# Patient Record
Sex: Female | Born: 1935 | Race: White | Hispanic: No | Smoking: Former smoker
Health system: Southern US, Community
[De-identification: ages and names within clinical notes are randomized; demographics above are authoritative.]

## PROBLEM LIST (undated history)

## (undated) DIAGNOSIS — R413 Other amnesia: Secondary | ICD-10-CM

## (undated) DIAGNOSIS — R8279 Other abnormal findings on microbiological examination of urine: Secondary | ICD-10-CM

## (undated) DIAGNOSIS — I050 Rheumatic mitral stenosis: Secondary | ICD-10-CM

## (undated) DIAGNOSIS — N390 Urinary tract infection, site not specified: Secondary | ICD-10-CM

## (undated) DIAGNOSIS — J189 Pneumonia, unspecified organism: Secondary | ICD-10-CM

## (undated) HISTORY — PX: BILATERAL SALPINGOOPHORECTOMY: SHX1223

## (undated) HISTORY — PX: ABDOMINAL HYSTERECTOMY: SHX81

## (undated) HISTORY — PX: APPENDECTOMY: SHX54

## (undated) HISTORY — DX: Other abnormal findings on microbiological examination of urine: R82.79

## (undated) HISTORY — DX: Rheumatic mitral stenosis: I05.0

## (undated) HISTORY — DX: Other amnesia: R41.3

## (undated) HISTORY — PX: TUBAL LIGATION: SHX77

## (undated) HISTORY — DX: Pneumonia, unspecified organism: J18.9

## (undated) HISTORY — PX: EYE SURGERY: SHX253

---

## 1998-05-21 ENCOUNTER — Ambulatory Visit (HOSPITAL_COMMUNITY): Admission: RE | Admit: 1998-05-21 | Discharge: 1998-05-21 | Payer: Self-pay | Admitting: Obstetrics & Gynecology

## 1999-05-20 ENCOUNTER — Ambulatory Visit (HOSPITAL_COMMUNITY): Admission: RE | Admit: 1999-05-20 | Discharge: 1999-05-20 | Payer: Self-pay | Admitting: Obstetrics & Gynecology

## 2000-04-06 ENCOUNTER — Encounter: Admission: RE | Admit: 2000-04-06 | Discharge: 2000-04-06 | Payer: Self-pay | Admitting: Family Medicine

## 2000-04-06 ENCOUNTER — Encounter: Payer: Self-pay | Admitting: Family Medicine

## 2000-05-25 ENCOUNTER — Ambulatory Visit (HOSPITAL_COMMUNITY): Admission: RE | Admit: 2000-05-25 | Discharge: 2000-05-25 | Payer: Self-pay | Admitting: Obstetrics & Gynecology

## 2001-03-18 ENCOUNTER — Encounter: Payer: Self-pay | Admitting: Family Medicine

## 2001-03-18 ENCOUNTER — Encounter: Admission: RE | Admit: 2001-03-18 | Discharge: 2001-03-18 | Payer: Self-pay | Admitting: Family Medicine

## 2001-04-10 ENCOUNTER — Encounter: Admission: RE | Admit: 2001-04-10 | Discharge: 2001-04-10 | Payer: Self-pay | Admitting: Family Medicine

## 2001-04-10 ENCOUNTER — Encounter: Payer: Self-pay | Admitting: Family Medicine

## 2001-04-15 ENCOUNTER — Encounter: Admission: RE | Admit: 2001-04-15 | Discharge: 2001-04-15 | Payer: Self-pay | Admitting: Family Medicine

## 2001-04-15 ENCOUNTER — Encounter: Payer: Self-pay | Admitting: Family Medicine

## 2001-05-03 ENCOUNTER — Ambulatory Visit (HOSPITAL_COMMUNITY): Admission: RE | Admit: 2001-05-03 | Discharge: 2001-05-03 | Payer: Self-pay | Admitting: Obstetrics & Gynecology

## 2002-04-17 ENCOUNTER — Encounter: Admission: RE | Admit: 2002-04-17 | Discharge: 2002-04-17 | Payer: Self-pay | Admitting: Family Medicine

## 2002-04-17 ENCOUNTER — Encounter: Payer: Self-pay | Admitting: Family Medicine

## 2002-04-24 ENCOUNTER — Inpatient Hospital Stay (HOSPITAL_COMMUNITY): Admission: RE | Admit: 2002-04-24 | Discharge: 2002-04-26 | Payer: Self-pay | Admitting: *Deleted

## 2002-04-24 ENCOUNTER — Encounter (INDEPENDENT_AMBULATORY_CARE_PROVIDER_SITE_OTHER): Payer: Self-pay

## 2003-04-20 ENCOUNTER — Encounter: Payer: Self-pay | Admitting: Family Medicine

## 2003-04-20 ENCOUNTER — Encounter: Admission: RE | Admit: 2003-04-20 | Discharge: 2003-04-20 | Payer: Self-pay | Admitting: Family Medicine

## 2004-04-20 ENCOUNTER — Encounter: Admission: RE | Admit: 2004-04-20 | Discharge: 2004-04-20 | Payer: Self-pay | Admitting: Family Medicine

## 2004-06-07 ENCOUNTER — Encounter: Admission: RE | Admit: 2004-06-07 | Discharge: 2004-06-07 | Payer: Self-pay | Admitting: Family Medicine

## 2004-10-04 ENCOUNTER — Ambulatory Visit: Payer: Self-pay | Admitting: Family Medicine

## 2004-11-08 ENCOUNTER — Ambulatory Visit: Payer: Self-pay | Admitting: Family Medicine

## 2004-11-24 ENCOUNTER — Ambulatory Visit: Payer: Self-pay | Admitting: Family Medicine

## 2005-04-21 ENCOUNTER — Encounter: Admission: RE | Admit: 2005-04-21 | Discharge: 2005-04-21 | Payer: Self-pay | Admitting: Family Medicine

## 2005-06-02 ENCOUNTER — Ambulatory Visit: Payer: Self-pay | Admitting: Family Medicine

## 2005-07-21 ENCOUNTER — Ambulatory Visit: Payer: Self-pay | Admitting: Family Medicine

## 2005-09-04 ENCOUNTER — Ambulatory Visit: Payer: Self-pay | Admitting: Family Medicine

## 2005-09-18 ENCOUNTER — Ambulatory Visit: Payer: Self-pay | Admitting: Family Medicine

## 2006-05-29 ENCOUNTER — Encounter: Admission: RE | Admit: 2006-05-29 | Discharge: 2006-05-29 | Payer: Self-pay | Admitting: Family Medicine

## 2007-01-29 ENCOUNTER — Ambulatory Visit: Payer: Self-pay | Admitting: Family Medicine

## 2007-02-19 ENCOUNTER — Ambulatory Visit: Payer: Self-pay | Admitting: Family Medicine

## 2007-05-22 ENCOUNTER — Ambulatory Visit: Payer: Self-pay | Admitting: Family Medicine

## 2007-05-30 ENCOUNTER — Encounter: Payer: Self-pay | Admitting: Family Medicine

## 2007-05-30 ENCOUNTER — Ambulatory Visit: Payer: Self-pay | Admitting: Family Medicine

## 2007-07-09 ENCOUNTER — Encounter: Admission: RE | Admit: 2007-07-09 | Discharge: 2007-07-09 | Payer: Self-pay | Admitting: Family Medicine

## 2007-07-23 DIAGNOSIS — M810 Age-related osteoporosis without current pathological fracture: Secondary | ICD-10-CM | POA: Insufficient documentation

## 2007-07-23 DIAGNOSIS — H918X9 Other specified hearing loss, unspecified ear: Secondary | ICD-10-CM

## 2007-07-23 DIAGNOSIS — I050 Rheumatic mitral stenosis: Secondary | ICD-10-CM | POA: Insufficient documentation

## 2007-07-23 DIAGNOSIS — G43909 Migraine, unspecified, not intractable, without status migrainosus: Secondary | ICD-10-CM | POA: Insufficient documentation

## 2007-07-23 DIAGNOSIS — R609 Edema, unspecified: Secondary | ICD-10-CM

## 2007-09-02 ENCOUNTER — Ambulatory Visit: Payer: Self-pay | Admitting: Family Medicine

## 2007-09-02 LAB — CONVERTED CEMR LAB
Albumin: 4.5 g/dL (ref 3.5–5.2)
Basophils Absolute: 0 10*3/uL (ref 0.0–0.1)
Basophils Relative: 0.6 % (ref 0.0–1.0)
Bilirubin Urine: NEGATIVE
Bilirubin, Direct: 0.2 mg/dL (ref 0.0–0.3)
Chloride: 107 meq/L (ref 96–112)
Eosinophils Absolute: 0.1 10*3/uL (ref 0.0–0.6)
Glucose, Bld: 95 mg/dL (ref 70–99)
Glucose, Urine, Semiquant: NEGATIVE
HCT: 38.8 % (ref 36.0–46.0)
MCHC: 33.9 g/dL (ref 30.0–36.0)
Neutro Abs: 5 10*3/uL (ref 1.4–7.7)
Neutrophils Relative %: 74 % (ref 43.0–77.0)
Platelets: 261 10*3/uL (ref 150–400)
RBC: 4.12 M/uL (ref 3.87–5.11)
RDW: 12.5 % (ref 11.5–14.6)
Sodium: 147 meq/L — ABNORMAL HIGH (ref 135–145)
TSH: 1.74 microintl units/mL (ref 0.35–5.50)
Urobilinogen, UA: 1
pH: 8.5

## 2008-01-08 DIAGNOSIS — R109 Unspecified abdominal pain: Secondary | ICD-10-CM

## 2008-01-10 ENCOUNTER — Ambulatory Visit: Payer: Self-pay | Admitting: Internal Medicine

## 2008-01-10 ENCOUNTER — Ambulatory Visit: Payer: Self-pay | Admitting: Family Medicine

## 2008-01-10 DIAGNOSIS — M549 Dorsalgia, unspecified: Secondary | ICD-10-CM | POA: Insufficient documentation

## 2008-01-10 LAB — CONVERTED CEMR LAB
Bilirubin Urine: NEGATIVE
Blood in Urine, dipstick: NEGATIVE
Ketones, urine, test strip: NEGATIVE
Nitrite: NEGATIVE
Protein, U semiquant: NEGATIVE
Urobilinogen, UA: NEGATIVE
pH: 6

## 2008-06-29 ENCOUNTER — Emergency Department (HOSPITAL_COMMUNITY): Admission: EM | Admit: 2008-06-29 | Discharge: 2008-06-30 | Payer: Self-pay | Admitting: Emergency Medicine

## 2008-07-07 ENCOUNTER — Telehealth: Payer: Self-pay | Admitting: Internal Medicine

## 2008-07-08 ENCOUNTER — Telehealth: Payer: Self-pay | Admitting: Internal Medicine

## 2008-07-09 ENCOUNTER — Encounter: Admission: RE | Admit: 2008-07-09 | Discharge: 2008-07-09 | Payer: Self-pay | Admitting: Family Medicine

## 2008-07-09 ENCOUNTER — Emergency Department (HOSPITAL_COMMUNITY): Admission: EM | Admit: 2008-07-09 | Discharge: 2008-07-09 | Payer: Self-pay | Admitting: Emergency Medicine

## 2008-09-02 ENCOUNTER — Ambulatory Visit: Payer: Self-pay | Admitting: Family Medicine

## 2008-09-02 LAB — CONVERTED CEMR LAB
Bilirubin Urine: NEGATIVE
Blood in Urine, dipstick: NEGATIVE
Nitrite: POSITIVE
Specific Gravity, Urine: 1.01
pH: 6.5

## 2008-09-03 LAB — CONVERTED CEMR LAB
ALT: 15 units/L (ref 0–35)
AST: 20 units/L (ref 0–37)
BUN: 12 mg/dL (ref 6–23)
Basophils Relative: 0.6 % (ref 0.0–3.0)
Bilirubin, Direct: 0.1 mg/dL (ref 0.0–0.3)
Chloride: 104 meq/L (ref 96–112)
GFR calc Af Amer: 122 mL/min
HCT: 37.6 % (ref 36.0–46.0)
Hemoglobin: 13.1 g/dL (ref 12.0–15.0)
Lymphocytes Relative: 22.7 % (ref 12.0–46.0)
MCV: 94.1 fL (ref 78.0–100.0)
Monocytes Relative: 6.3 % (ref 3.0–12.0)
Potassium: 3.7 meq/L (ref 3.5–5.1)
RBC: 4 M/uL (ref 3.87–5.11)
Sodium: 143 meq/L (ref 135–145)
Total Protein: 7 g/dL (ref 6.0–8.3)

## 2008-10-05 ENCOUNTER — Ambulatory Visit: Payer: Self-pay | Admitting: Family Medicine

## 2008-10-05 DIAGNOSIS — J029 Acute pharyngitis, unspecified: Secondary | ICD-10-CM | POA: Insufficient documentation

## 2008-10-05 DIAGNOSIS — H6120 Impacted cerumen, unspecified ear: Secondary | ICD-10-CM | POA: Insufficient documentation

## 2008-10-05 LAB — CONVERTED CEMR LAB: Rapid Strep: NEGATIVE

## 2008-10-31 ENCOUNTER — Emergency Department (HOSPITAL_COMMUNITY): Admission: EM | Admit: 2008-10-31 | Discharge: 2008-10-31 | Payer: Self-pay | Admitting: Emergency Medicine

## 2008-11-05 ENCOUNTER — Ambulatory Visit: Payer: Self-pay | Admitting: Family Medicine

## 2009-05-23 IMAGING — CR DG PELVIS 1-2V
1 series · 1 of 1 positions shown · non-contrast
Comparison: None

CLINICAL DATA: Fall with pelvic and sacral pain.

PELVIS - 1-2 VIEW

[t pelvis a.p.]
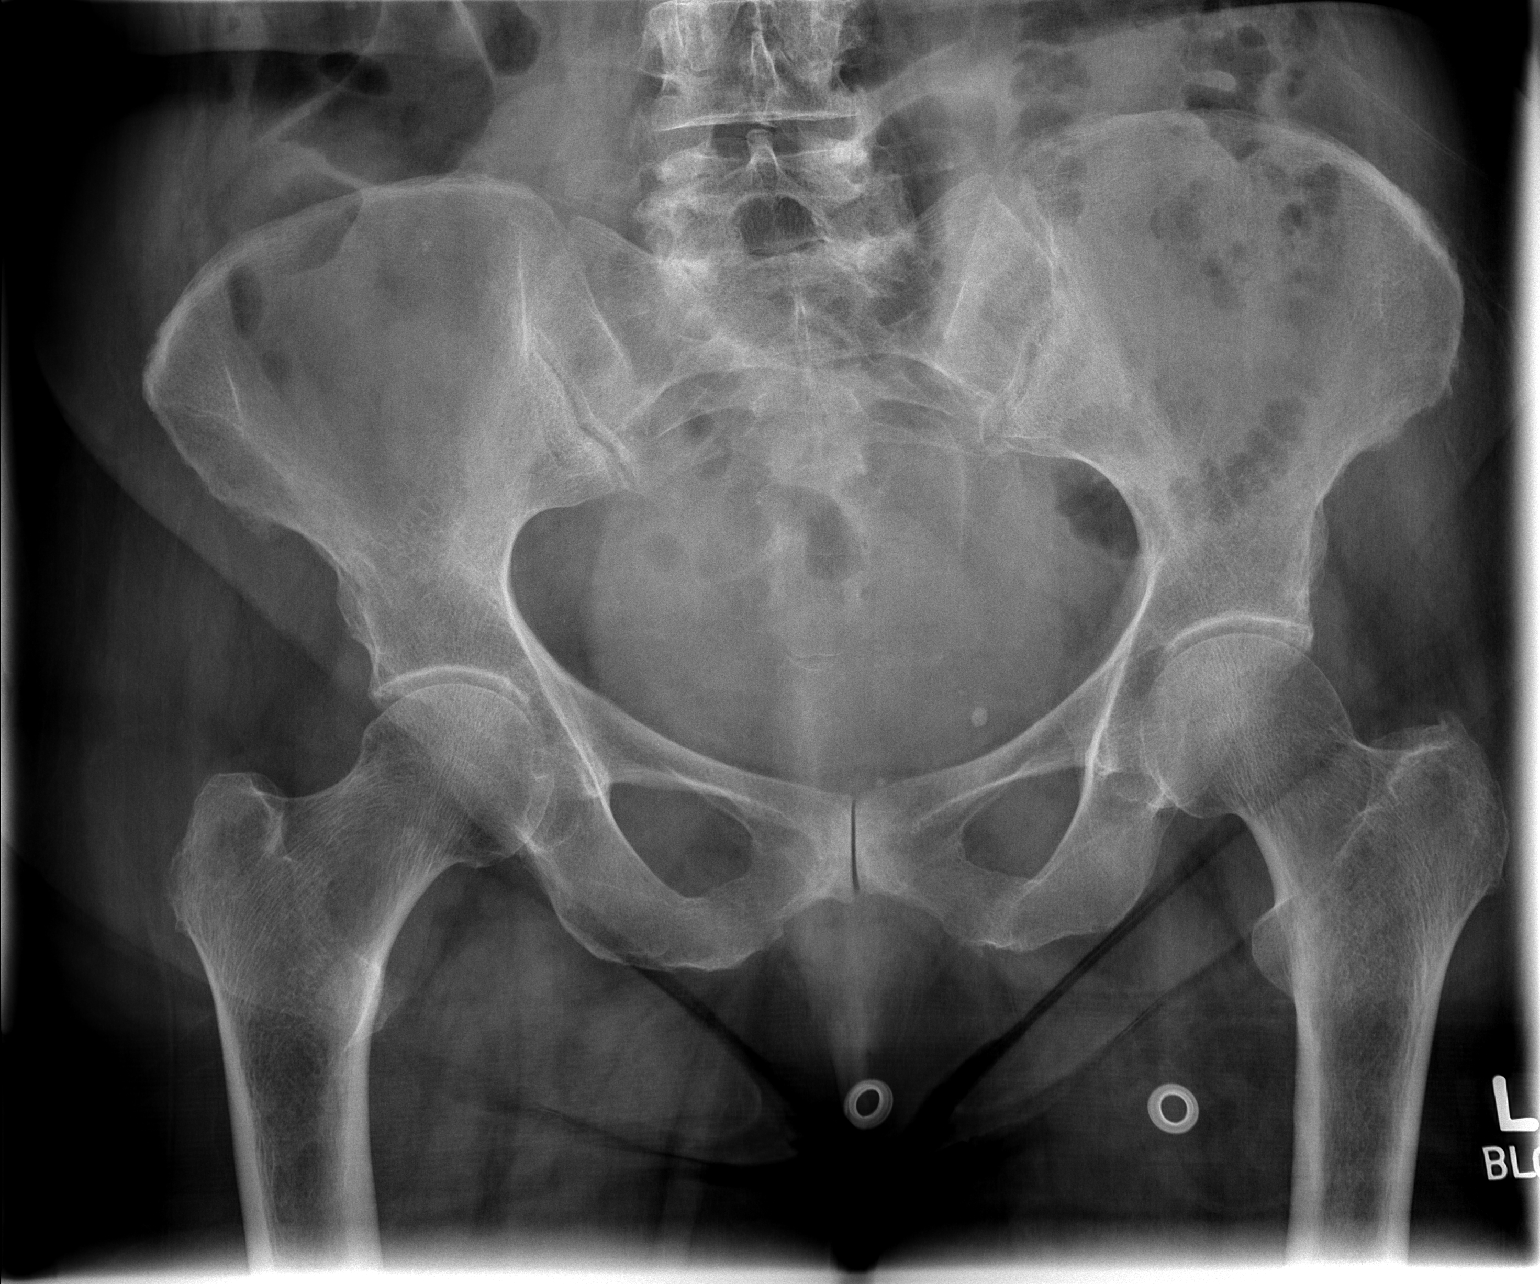

[1 of 1 positions shown; findings below may reference images not displayed]

FINDINGS: No definite fracture or dislocation.  Mild degenerative
changes are seen in the hips and symphysis pubis.
IMPRESSION: No definite acute fracture.

## 2009-07-12 ENCOUNTER — Encounter: Admission: RE | Admit: 2009-07-12 | Discharge: 2009-07-12 | Payer: Self-pay | Admitting: Family Medicine

## 2009-08-12 ENCOUNTER — Ambulatory Visit: Payer: Self-pay | Admitting: Family Medicine

## 2009-09-13 ENCOUNTER — Telehealth: Payer: Self-pay | Admitting: Family Medicine

## 2009-11-16 ENCOUNTER — Telehealth: Payer: Self-pay | Admitting: Family Medicine

## 2009-11-17 ENCOUNTER — Ambulatory Visit: Payer: Self-pay | Admitting: Family Medicine

## 2010-01-28 ENCOUNTER — Telehealth: Payer: Self-pay | Admitting: Family Medicine

## 2010-01-28 ENCOUNTER — Encounter (INDEPENDENT_AMBULATORY_CARE_PROVIDER_SITE_OTHER): Payer: Self-pay | Admitting: *Deleted

## 2010-01-28 DIAGNOSIS — R109 Unspecified abdominal pain: Secondary | ICD-10-CM | POA: Insufficient documentation

## 2010-02-01 ENCOUNTER — Telehealth: Payer: Self-pay | Admitting: *Deleted

## 2010-02-21 ENCOUNTER — Ambulatory Visit: Payer: Self-pay | Admitting: Family Medicine

## 2010-02-21 DIAGNOSIS — N3000 Acute cystitis without hematuria: Secondary | ICD-10-CM | POA: Insufficient documentation

## 2010-02-21 LAB — CONVERTED CEMR LAB
Bilirubin Urine: NEGATIVE
Glucose, Urine, Semiquant: NEGATIVE
Ketones, urine, test strip: NEGATIVE
Protein, U semiquant: 100

## 2010-02-28 ENCOUNTER — Ambulatory Visit: Payer: Self-pay | Admitting: Gastroenterology

## 2010-03-10 ENCOUNTER — Ambulatory Visit: Payer: Self-pay | Admitting: Gastroenterology

## 2010-03-10 LAB — HM COLONOSCOPY

## 2010-03-14 ENCOUNTER — Encounter: Payer: Self-pay | Admitting: Gastroenterology

## 2010-10-26 ENCOUNTER — Ambulatory Visit: Payer: Self-pay | Admitting: Family Medicine

## 2010-10-26 DIAGNOSIS — J449 Chronic obstructive pulmonary disease, unspecified: Secondary | ICD-10-CM

## 2010-11-01 ENCOUNTER — Ambulatory Visit: Payer: Self-pay | Admitting: Family Medicine

## 2010-12-04 ENCOUNTER — Encounter: Payer: Self-pay | Admitting: Family Medicine

## 2010-12-09 ENCOUNTER — Other Ambulatory Visit: Payer: Self-pay | Admitting: Family Medicine

## 2010-12-09 ENCOUNTER — Encounter: Payer: Self-pay | Admitting: Family Medicine

## 2010-12-09 ENCOUNTER — Ambulatory Visit
Admission: RE | Admit: 2010-12-09 | Discharge: 2010-12-09 | Payer: Self-pay | Source: Home / Self Care | Attending: Family Medicine | Admitting: Family Medicine

## 2010-12-09 LAB — BASIC METABOLIC PANEL
BUN: 12 mg/dL (ref 6–23)
Calcium: 9.5 mg/dL (ref 8.4–10.5)
GFR: 102.17 mL/min (ref >60.00)
Glucose, Bld: 102 mg/dL — ABNORMAL HIGH (ref 70–99)
Sodium: 139 mEq/L (ref 135–145)

## 2010-12-09 LAB — CBC WITH DIFFERENTIAL/PLATELET
Basophils Relative: 0.7 % (ref 0.0–3.0)
Eosinophils Relative: 3.9 % (ref 0.0–5.0)
Lymphocytes Relative: 18.9 % (ref 12.0–46.0)
Lymphs Abs: 0.9 10*3/uL (ref 0.7–4.0)
MCHC: 34.1 g/dL (ref 30.0–36.0)
Monocytes Absolute: 0.5 10*3/uL (ref 0.1–1.0)
Monocytes Relative: 10 % (ref 3.0–12.0)
Neutrophils Relative %: 66.5 % (ref 43.0–77.0)
RDW: 13.4 % (ref 11.5–14.6)
WBC: 4.9 10*3/uL (ref 4.5–10.5)

## 2010-12-09 LAB — HEPATIC FUNCTION PANEL
AST: 24 U/L (ref 0–37)
Bilirubin, Direct: 0.1 mg/dL (ref 0.0–0.3)
Total Protein: 6.6 g/dL (ref 6.0–8.3)

## 2010-12-09 LAB — TSH: TSH: 1.63 u[IU]/mL (ref 0.35–5.50)

## 2010-12-13 ENCOUNTER — Ambulatory Visit
Admission: RE | Admit: 2010-12-13 | Discharge: 2010-12-13 | Payer: Self-pay | Source: Home / Self Care | Attending: Family Medicine | Admitting: Family Medicine

## 2010-12-15 NOTE — Letter (Signed)
Summary: New Patient letter  [facility]  [address] [place], [place] [address]   Phone: [phone]  Fax: [phone]       01/28/2010 MRN: [id number]  [facility] [address] [place], [place]  [address]  Dear Ms. Vandeventer,  Welcome to the Gastroenterology Division at [facility].    You are scheduled to see Dr.  Arlyce Dice on 02-28-10 at 1:30pm on the 3rd floor at [facility], [address]  We ask that you try to arrive at our office 15 minutes prior to your appointment time to allow for check-in.  We would like you to complete the enclosed self-administered evaluation form prior to your visit and bring it with you on the day of your appointment.  We will review it with you.  Also, please bring a complete list of all your medications or, if you prefer, bring the medication bottles and we will list them.  Please bring your insurance card so that we may make a copy of it.  If your insurance requires a referral to see a specialist, please bring your referral form from your primary care physician.  Co-payments are due at the time of your visit and may be paid by cash, check or credit card.     Your office visit will consist of a consult with your physician (includes a physical exam), any laboratory testing he/she may order, scheduling of any necessary diagnostic testing (e.g. x-ray, ultrasound, CT-scan), and scheduling of a procedure (e.g. Endoscopy, Colonoscopy) if required.  Please allow enough time on your schedule to allow for any/all of these possibilities.    If you cannot keep your appointment, please call [phone] to cancel or reschedule prior to your appointment date.  This allows [place] the opportunity to schedule an appointment for another patient in need of care.  If you do not cancel or reschedule by 5 p.m. the business day prior to your appointment date, you will be charged a $50.00 late cancellation/no-show fee.    Thank you for choosing [facility] for your medical needs.  We appreciate the opportunity to care for you.  Please visit [place] at our website  to learn more about our practice.                     Sincerely,                                                             The Gastroenterology Division

## 2010-12-15 NOTE — Assessment & Plan Note (Signed)
Summary: tues fup on lungs/per Dr Todd/cjr   Vital Signs:  Patient profile:   77 year old female Weight:      114 pounds Temp:     98.1 degrees F oral BP sitting:   122 / 80 Cuff size:   regular  Vitals Entered By: Kern Reap CMA Duncan Dull) (November 01, 2010 12:23 PM) CC: follow-up visit on lungs Is Patient Diabetic? No   Primary Care Provider:  Leotis Shames Todd,MD  CC:  follow-up visit on lungs.  History of Present Illness: Lori [name] is a 77 year old female, who comes in today for follow-up of COPD with a flare.  She has stopped.  The cough syrup because of nighttime cough is gone away.  She is tapered her prednisone to 10 mg every other day and feels back to baseline  Allergies: No Known Drug Allergies  Past History:  Past medical, surgical, family and social histories (including risk factors) reviewed for relevance to current acute and chronic problems.  Past Medical History: Reviewed history from 09/02/2007 and no changes required. Osteoporosis peri edema mitral stenosis migraine headache hearing loss appendectomy T. H. BSO bladder tacking pneumonia x 1 bilateral cataracts childbirth x 5  Past Surgical History: Reviewed history from 07/23/2007 and no changes required. CB X5 Appendectomy Cataract extraction, bilateral Hysterectomy (bleed)--ER Oophorectomy, salpingectomy--bilateral Tubal ligation, bilateral pneumonia  Family History: Reviewed history from 02/28/2010 and no changes required. No FH of Colon Cancer:  Social History: Reviewed history from 10/26/2010 and no changes required. Retired Never Smoked Alcohol use-no Drug use-no Regular exercise-yes 6 children Widow/Widower  Review of Systems      See HPI  Physical Exam  General:  Well-developed,well-nourished,in no acute distress; alert,appropriate and cooperative throughout examination Lungs:  symmetrical but decreased breast sounds per usual.  Very mild at late expiratory  wheezing   Impression & Recommendations:  Problem # 1:  ASTHMA WITH COPD (ICD-493.20) Assessment Improved  Her updated medication list for this problem includes:    Prednisone 20 Mg Tabs (Prednisone) ..... Uad  Complete Medication List: 1)  Asa  2)  Hydrochlorothiazide 12.5 Mg Tabs (Hydrochlorothiazide) .... Take 1 tablet by mouth every morning 3)  Calcium-vitamin D 500-200 Mg-unit Tabs (Calcium carbonate-vitamin d) .... Otc ?dose 4)  Zomig 2.5 Mg Tabs (Zolmitriptan) .... Uad for migraine 5)  Vicodin Es 7.5-750 Mg Tabs (Hydrocodone-acetaminophen) .... Take 1 tablet by mouth four times a day as needed 6)  Prednisone 20 Mg Tabs (Prednisone) .... Uad 7)  Hydromet 5-1.5 Mg/23ml Syrp (Hydrocodone-homatropine) .... 1/2 tsp at bedtime as needed cough  Patient Instructions: 1)  take a half of a prednisone tablet tomorrow, skip Thursday, a half a tablet on  Friday, skip the weekend, then a half a tablet Monday, Wednesday, Friday, for a 3-week taper.  Return p.r.n.   Orders Added: 1)  Est. Patient Level III [21308]

## 2010-12-15 NOTE — Procedures (Signed)
Summary: Colonoscopy  Patient: Lori [name] Note: All result statuses are Final unless otherwise noted.  Tests: (1) Colonoscopy (COL)   COL Colonoscopy           DONE     [facility]     [address]     [place], [place]  [address]           COLONOSCOPY PROCEDURE REPORT           PATIENT:  Lori, [name]  MR#:  [id number]     BIRTHDATE:  05/20/1920, 89 yrs. old  GENDER:  female           ENDOSCOPIST:  Barbette Hair. Arlyce Dice, MD     Referred by:  Eugenio Hoes Tawanna Cooler, M.D.           PROCEDURE DATE:  03/10/2010     PROCEDURE:  Colonoscopy with snare polypectomy     ASA CLASS:  Class II     INDICATIONS:  1) change in bowel habits           MEDICATIONS:   Fentanyl 50 mcg IV, Versed 5 mg IV           DESCRIPTION OF PROCEDURE:   After the risks benefits and     alternatives of the procedure were thoroughly explained, informed     consent was obtained.  Digital rectal exam was performed and     revealed no abnormalities.   The LB [id number] endoscope     was introduced through the anus and advanced to the cecum, which     was identified by the ileocecal valve, without limitations.  The     quality of the prep was excellent, using MoviPrep.  The instrument     was then slowly withdrawn as the colon was fully examined.     <<PROCEDUREIMAGES>>           FINDINGS:  A sessile polyp was found in the ascending colon.   It     was 4 mm in size. Polyp was snared without cautery. Retrieval was     successful (see image4). snare polyp  A sessile polyp was found in     the rectum. It was 3 mm in size. It was found 2 cm from the point     of entry. Polyp was snared without cautery. Retrieval was     successful (see image13). snare polyp  Severe diverticulosis was     found in the sigmoid colon (see image9 and image1).  Internal     hemorrhoids were found (see image14).  This was otherwise a normal     examination of the colon (see image2, image3, image6, image7,     image8, and image12).    Retroflexed views in the rectum revealed     no abnormalities.    The time to cecum =  7.25  minutes. The scope     was then withdrawn (time =  11.0  min) from the patient and the     procedure completed.           COMPLICATIONS:  None           ENDOSCOPIC IMPRESSION:     1) 4 mm sessile polyp     2) 3 mm sessile polyp in the rectum     3) Severe diverticulosis in the sigmoid colon     4) Internal hemorrhoids     5) Otherwise normal examination     RECOMMENDATIONS:  1) high fiber diet     2) OV 4 weeks     3) no f/u colonoscopy unless pathology shows dysplasia, in view of     age           REPEAT EXAM:  No           ______________________________     Barbette Hair. Arlyce Dice, MD           CC:           n.     eSIGNED:   Barbette Hair. Hadiyah Maricle at 03/10/2010 10:34 AM           Page 2 of 3   Lori, [name], [id number]  Note: An exclamation mark (!) indicates a result that was not dispersed into the flowsheet. Document Creation Date: 03/10/2010 10:35 AM _______________________________________________________________________  (1) Order result status: Final Collection or observation date-time: 03/10/2010 10:26 Requested date-time:  Receipt date-time:  Reported date-time:  Referring Physician:   Ordering Physician: Melvia Heaps [phone]) Specimen Source:  Source: Launa Grill Order Number: [id number] Lab site:

## 2010-12-15 NOTE — Progress Notes (Signed)
  Phone Note Call from Patient Call back at [phone]   Call For: Roderick Pee MD Summary of Call: Fleet Contras - please call patient.  She left no other information on voice mail Initial call taken by: Everrett Coombe,  February 01, 2010 2:24 PM  Follow-up for Phone Call        left message on machine for patient to call back Follow-up by: Kern Reap CMA Duncan Dull),  February 01, 2010 2:30 PM  Additional Follow-up for Phone Call Additional follow up Details #1::        Phone Call Completed Additional Follow-up by: Kern Reap CMA Duncan Dull),  February 02, 2010 11:57 AM

## 2010-12-15 NOTE — Assessment & Plan Note (Signed)
Summary: pt will come in fasting/njr   Vital Signs:  Patient profile:   77 year old female Height:      60.25 inches Weight:      111 pounds Temp:     98.1 degrees F oral BP sitting:   124 / 74  (left arm) Cuff size:   regular  Vitals Entered By: Kern Reap CMA Duncan Dull) (December 09, 2010 10:44 AM) CC: wellness exam Is Patient Diabetic? No   Primary Care Provider:  Leotis Shames Todd,MD  CC:  wellness exam.  History of Present Illness: Lori [name] is a recently widowed 77 year old female, nonsmoker, who comes in today for another care wellness exam.  Because of a history of underlying hypertension, asthma, with COPD, hearing loss, and migraine headaches, and mitral stenosis.  Overall, she states she feels well and has no complaints, except she's had a cough for 3 weeks.  She takes hydrochlorothiazide 12.5 mg daily, and Zomig p.r.n. for migraines, although she's not had a migraine in over 6 months.  She gets routine eye care, dental care, as staged out of colonoscopies, and mammograms, tetanus, 2008, seasonal flu 2010, Pneumovax 2008 Here for Medicare AWV:  1.   Risk factors based on Past M, S, F history:...reviewed.  No changes except for cough x 3 weeks 2.   Physical Activities: sedentary 3.   Depression/mood: good mood.  No depression 4.   Hearing: bilateral hearing aids 5.   ADL's: functions independently lives alone 6.   Fall Risk: reviewed.  None identified 7.   Home Safety: no guns in the house 8.   Height, weight, &visual acuity:height weight, vision, unchanged 9.   Counseling: continue good health habits 10.   Labs ordered based on risk factors: done today 11.           Referral Coordination........none indicated 12.           Care Plan.......Marland Kitchenreviewed all medications 13.            Cognitive Assessment ,,oriented x 3.  His son helps her with her finances.  Recommend she get a living will and healthcare and financial power-of-attorney  Allergies (verified): No Known Drug  Allergies  Past History:  Past medical, surgical, family and social histories (including risk factors) reviewed, and no changes noted (except as noted below).  Past Medical History: Reviewed history from 09/02/2007 and no changes required. Osteoporosis peri edema mitral stenosis migraine headache hearing loss appendectomy T. H. BSO bladder tacking pneumonia x 1 bilateral cataracts childbirth x 5  Past Surgical History: Reviewed history from 07/23/2007 and no changes required. CB X5 Appendectomy Cataract extraction, bilateral Hysterectomy (bleed)--ER Oophorectomy, salpingectomy--bilateral Tubal ligation, bilateral pneumonia  Family History: Reviewed history from 02/28/2010 and no changes required. No FH of Colon Cancer:  Social History: Reviewed history from 10/26/2010 and no changes required. Retired Never Smoked Alcohol use-no Drug use-no Regular exercise-yes 6 children Widow/Widower  Review of Systems      See HPI  Physical Exam  General:  Well-developed,well-nourished,in no acute distress; alert,appropriate and cooperative throughout examination Head:  Normocephalic and atraumatic without obvious abnormalities. No apparent alopecia or balding. Eyes:  No corneal or conjunctival inflammation noted. EOMI. Perrla. Funduscopic exam benign, without hemorrhages, exudates or papilledema. Vision grossly normal. Ears:  External ear exam shows no significant lesions or deformities.  Otoscopic examination reveals clear canals, tympanic membranes are intact bilaterally without bulging, retraction, inflammation or discharge. Hearing is grossly normal bilaterally. Nose:  External nasal examination shows no deformity or inflammation.  Nasal mucosa are pink and moist without lesions or exudates. Mouth:  Oral mucosa and oropharynx without lesions or exudates.  Teeth in good repair. Neck:  No deformities, masses, or tenderness noted. Chest Wall:  No deformities, masses, or  tenderness noted. Breasts:  breast tissue is absent Lungs:  symmetrical bilateral wheezing Heart:  PMI not palpable.  S1, S2, within normal limits, as this is a likely split.  She has a grade 2/6 systolic ejection murmur consistent with mitral stenosis Abdomen:  Bowel sounds positive,abdomen soft and non-tender without masses, organomegaly or hernias noted. Msk:  No deformity or scoliosis noted of thoracic or lumbar spine.   Pulses:  R and L carotid,radial,femoral,dorsalis pedis and posterior tibial pulses are full and equal bilaterally Extremities:  No clubbing, cyanosis, edema, or deformity noted with normal full range of motion of all joints.   Neurologic:  No cranial nerve deficits noted. Station and gait are normal. Plantar reflexes are down-going bilaterally. DTRs are symmetrical throughout. Sensory, motor and coordinative functions appear intact. Skin:  Intact without suspicious lesions or rashes Cervical Nodes:  No lymphadenopathy noted Axillary Nodes:  No palpable lymphadenopathy Inguinal Nodes:  No significant adenopathy Psych:  Cognition and judgment appear intact. Alert and cooperative with normal attention span and concentration. No apparent delusions, illusions, hallucinations   Impression & Recommendations:  Problem # 1:  ASTHMA WITH COPD (ICD-493.20) Assessment Deteriorated  Her updated medication list for this problem includes:    Prednisone 20 Mg Tabs (Prednisone) ..... Uad  Orders: Venipuncture ([address]) TLB-BMP (Basic Metabolic Panel-BMET) (80048-METABOL) TLB-CBC Platelet - w/Differential (85025-CBCD) TLB-Hepatic/Liver Function Pnl (80076-HEPATIC) TLB-TSH (Thyroid Stimulating Hormone) (60454-UJW) Prescription Created Electronically [id number]) Medicare -1st Annual Wellness Visit [phone]) Urinalysis-dipstick only (Medicare patient) (62130QM) Specimen Handling (57[address])  Problem # 2:  HEALTH SCREENING (ICD-V70.0) Assessment: Unchanged  Orders: Venipuncture  ([address]) TLB-BMP (Basic Metabolic Panel-BMET) (80048-METABOL) TLB-CBC Platelet - w/Differential (85025-CBCD) TLB-Hepatic/Liver Function Pnl (80076-HEPATIC) TLB-TSH (Thyroid Stimulating Hormone) (28413-KGM) Prescription Created Electronically [phone]) Medicare -1st Annual Wellness Visit [phone]) Urinalysis-dipstick only (Medicare patient) (44034VQ) Specimen Handling (25956)  Problem # 3:  MIGRAINE HEADACHE (ICD-346.90) Assessment: Improved  Her updated medication list for this problem includes:    Zomig 2.5 Mg Tabs (Zolmitriptan) ..... Uad for migraine    Vicodin Es 7.5-750 Mg Tabs (Hydrocodone-acetaminophen) .Marland Kitchen... Take 1 tablet by mouth four times a day as needed  Orders: Venipuncture ([address]) TLB-BMP (Basic Metabolic Panel-BMET) (80048-METABOL) TLB-CBC Platelet - w/Differential (85025-CBCD) TLB-Hepatic/Liver Function Pnl (80076-HEPATIC) TLB-TSH (Thyroid Stimulating Hormone) (43329-JJO) Prescription Created Electronically [id number]) Medicare -1st Annual Wellness Visit [phone]) Urinalysis-dipstick only (Medicare patient) (60109NA)  Problem # 4:  STENOSIS, MITRAL (ICD-394.0) Assessment: Unchanged  Orders: Venipuncture (35573) TLB-BMP (Basic Metabolic Panel-BMET) (80048-METABOL) TLB-CBC Platelet - w/Differential (85025-CBCD) TLB-Hepatic/Liver Function Pnl (80076-HEPATIC) TLB-TSH (Thyroid Stimulating Hormone) (22025-KYH) Prescription Created Electronically [phone]) Medicare -1st Annual Wellness Visit [phone]) Urinalysis-dipstick only (Medicare patient) (51761YW) Specimen Handling (73710)  Complete Medication List: 1)  Asa  2)  Hydrochlorothiazide 12.5 Mg Tabs (Hydrochlorothiazide) .... Take 1 tablet by mouth every morning 3)  Calcium-vitamin D 500-200 Mg-unit Tabs (Calcium carbonate-vitamin d) .... Otc ?dose 4)  Zomig 2.5 Mg Tabs (Zolmitriptan) .... Uad for migraine 5)  Vicodin Es 7.5-750 Mg Tabs (Hydrocodone-acetaminophen) .... Take 1 tablet by mouth four times a day as needed 6)   Prednisone 20 Mg Tabs (Prednisone) .... Uad 7)  Hydromet 5-1.5 Mg/29ml Syrp (Hydrocodone-homatropine) .... 1/2 tsp at bedtime as needed cough  Patient Instructions: 1)  I would recommend that you get a living will and assigned  one of your children.  As the healthcare power of attorney and as your financial power-of-attorney. 2)  Begin prednisone take two tabs /day........Marland Kitchen  Return next Tuesday for follow-up 3)  Choose your Health care Power of Attorney and/or prepare a Living Will. Prescriptions: PREDNISONE 20 MG TABS (PREDNISONE) UAD  #50 x 1   Entered and Authorized by:   Roderick Pee MD   Signed by:   Roderick Pee MD on 12/09/2010   Method used:   Electronically to        [facility]  [address] [id number] (retail)       [address]       [place], [place]  [address]       Ph: [phone] or [phone]       Fax: [phone]   RxID:   [id number] ZOMIG 2.5 MG TABS (ZOLMITRIPTAN) UAD for migraine  #2 x 3   Entered and Authorized by:   Roderick Pee MD   Signed by:   Roderick Pee MD on 12/09/2010   Method used:   Electronically to        [facility]  [address] [id number] ([facility]ail)       [address]       [place], [place]  [address]       Ph: [phone] or [phone]       Fax: [phone]   RxID:   [id number] HYDROCHLOROTHIAZIDE 12.5 MG  TABS (HYDROCHLOROTHIAZIDE) Take 1 tablet by mouth every morning  #100 Capsule x 3   Entered and Authorized by:   Roderick Pee MD   Signed by:   Roderick Pee MD on 12/09/2010   Method used:   [facility] to        [facility]  [address] [id number]       [address]       [place], [place]  [address]       Ph: [phone] or [phone]       Fax: [phone]   RxID:   [id number]    Orders Added: 1)  Venipuncture [10175] 2)  TLB-BMP (Basic Metabolic Panel-BMET) [80048-METABOL] 3)  TLB-CBC Platelet - w/Differential [85025-CBCD] 4)  TLB-Hepatic/Liver Function Pnl  [80076-HEPATIC] 5)  TLB-TSH (Thyroid Stimulating Hormone) [84443-TSH] 6)  Prescription Created Electronically [G8553] 7)  Medicare -1st Annual Wellness Visit [G0438] 8)  Urinalysis-dipstick only (Medicare patient) [81003QW] 9)  Specimen Handling [99000]    Prevention & Chronic Care Immunizations   Influenza vaccine: Fluvax 3+  (08/12/2009)    Tetanus booster: 09/02/2007: Td    Pneumococcal vaccine: Pneumovax (Medicare)  (09/02/2007)    H. zoster vaccine: Not documented  Colorectal Screening   Hemoccult: historical  (11/13/2004)    Colonoscopy: DONE  (03/10/2010)  Other Screening   Pap smear: Not documented    Mammogram: ASSESSMENT: Negative - BI-RADS 1^MM DIGITAL SCREENING  (07/12/2009)    DXA bone density scan: Not documented   Smoking status: never  (01/10/2008)  Lipids   Total Cholesterol: Not documented   LDL: Not documented   LDL Direct: Not documented   HDL: Not documented   Triglycerides: Not documented      Appended Document: pt will come in fasting/njr  Laboratory Results   Urine Tests    Routine Urinalysis   Color: yellow Appearance: Clear Glucose: negative   (Normal Range: Negative) Bilirubin: negative   (  Normal Range: Negative) Ketone: trace (5)   (Normal Range: Negative) Spec. Gravity: 1.015   (Normal Range: 1.003-1.035) Blood: trace-intact   (Normal Range: Negative) pH: 8.5   (Normal Range: 5.0-8.0) Protein: 1+   (Normal Range: Negative) Urobilinogen: 0.2   (Normal Range: 0-1) Nitrite: negative   (Normal Range: Negative) Leukocyte Esterace: 3+   (Normal Range: Negative)    Comments: Rita Ohara  December 09, 2010 1:23 PM

## 2010-12-15 NOTE — Assessment & Plan Note (Signed)
Summary: H/A , CONGESTION // RS   Vital Signs:  Patient profile:   77 year old female Weight:      126 pounds Temp:     98.2 degrees F oral BP sitting:   140 / 78  (left arm) Cuff size:   regular  Vitals Entered By: Kern Reap CMA Duncan Dull) (November 17, 2009 10:54 AM)  Reason for Visit headache x 2 days   History of Present Illness: Lori [name] is 77 year old female, married, nonsmoker, who comes in today for evaluation of headache.  She states on January the third she was out running errands at approximately 115, developed the sudden onset of severe pain in the right side of her head.  She points to the area above her right ear as a source of pain.  She states the pain was severe, sharp, a 10 on a scale of one to 10.  She had no nausea, vomiting, or diarrhea.  She had no preceding visual aura.  She went home the headache lasted for about 12 hours, plus and gradually eased off.  She died denies any neurologic symptoms.  She's had a history of migraine headaches in the past, but her headaches have typically been bilateral and not unilateral.  She occasionally will have a more, but not always  Allergies: No Known Drug Allergies  Past History:  Past medical, surgical, family and social histories (including risk factors) reviewed for relevance to current acute and chronic problems.  Past Medical History: Reviewed history from 09/02/2007 and no changes required. Osteoporosis peri edema mitral stenosis migraine headache hearing loss appendectomy T. H. BSO bladder tacking pneumonia x 1 bilateral cataracts childbirth x 5  Past Surgical History: Reviewed history from 07/23/2007 and no changes required. CB X5 Appendectomy Cataract extraction, bilateral Hysterectomy (bleed)--ER Oophorectomy, salpingectomy--bilateral Tubal ligation, bilateral pneumonia  Family History: Reviewed history and no changes required.  Social History: Reviewed history from 01/10/2008 and no changes  required. Retired Married Never Smoked Alcohol use-no Drug use-no Regular exercise-yes  Review of Systems      See HPI  Physical Exam  General:  Well-developed,well-nourished,in no acute distress; alert,appropriate and cooperative throughout examination Head:  Normocephalic and atraumatic without obvious abnormalities. No apparent alopecia or balding. Eyes:  No corneal or conjunctival inflammation noted. EOMI. Perrla. Funduscopic exam benign, without hemorrhages, exudates or papilledema. Vision grossly normal. Ears:  External ear exam shows no significant lesions or deformities.  Otoscopic examination reveals clear canals, tympanic membranes are intact bilaterally without bulging, retraction, inflammation or discharge. Hearing is grossly normal bilaterally. Nose:  External nasal examination shows no deformity or inflammation. Nasal mucosa are pink and moist without lesions or exudates. Mouth:  Oral mucosa and oropharynx without lesions or exudates.  Teeth in good repair. Neck:  No deformities, masses, or tenderness noted. Msk:  No deformity or scoliosis noted of thoracic or lumbar spine.   Pulses:  R and L carotid,radial,femoral,dorsalis pedis and posterior tibial pulses are full and equal bilaterally Extremities:  No clubbing, cyanosis, edema, or deformity noted with normal full range of motion of all joints.   Neurologic:  No cranial nerve deficits noted. Station and gait are normal. Plantar reflexes are down-going bilaterally. DTRs are symmetrical throughout. Sensory, motor and coordinative functions appear intact.   Impression & Recommendations:  Problem # 1:  MIGRAINE HEADACHE (ICD-346.90) Assessment Deteriorated  The following medications were removed from the medication list:    Vicodin Es 7.5-750 Mg Tabs (Hydrocodone-acetaminophen) .Marland Kitchen... Take 1 tablet by mouth four times  a day Her updated medication list for this problem includes:    Zomig 2.5 Mg Tabs (Zolmitriptan) .....  Uad for migraine    Vicodin Es 7.5-750 Mg Tabs (Hydrocodone-acetaminophen) .Marland Kitchen... Take 1 tablet by mouth four times a day as needed  Orders: Prescription Created Electronically [phone])  Complete Medication List: 1)  Asa  2)  Hydrochlorothiazide 12.5 Mg Tabs (Hydrochlorothiazide) .... Take 1 tablet by mouth every morning 3)  Calcium-vitamin D 500-200 Mg-unit Tabs (Calcium carbonate-vitamin d) .... Otc ?dose 4)  Zomig 2.5 Mg Tabs (Zolmitriptan) .... Uad for migraine 5)  Vicodin Es 7.5-750 Mg Tabs (Hydrocodone-acetaminophen) .... Take 1 tablet by mouth four times a day as needed  Patient Instructions: 1)  takes Zomig, one immediately at the first sign of a migraine headache. 2)  If the Zomig does not work and take Vicodin one every 4 to 6 hours and stay at bed rest Prescriptions: VICODIN ES 7.5-750 MG TABS (HYDROCODONE-ACETAMINOPHEN) Take 1 tablet by mouth four times a day as needed  #30 x 0   Entered and Authorized by:   Roderick Pee MD   Signed by:   Roderick Pee MD on 11/17/2009   Method used:   Print then Give to Patient   RxID:   [id number] ZOMIG 2.5 MG TABS (ZOLMITRIPTAN) UAD for migraine  #6 x 11   Entered and Authorized by:   Roderick Pee MD   Signed by:   Roderick Pee MD on 11/17/2009   Method used:   [facility]  [id number]       [address]       [place], [place]  [address]       Ph: [phone] or [phone]       Fax: [phone]   RxID:   [id number]

## 2010-12-15 NOTE — Letter (Signed)
Summary: Results Letter  [facility]  [address] [place], [place] [address]   Phone: [phone]  Fax: [phone]        February 28, 2010 MRN: [id number]    CIEL CHERVENAK [address] [place], [place]  [address]    Dear Ms. Gaffey,  It is my pleasure to have treated you recently as a new patient in my office. I appreciate your confidence and the opportunity to participate in your care.  Since I do have a busy inpatient endoscopy schedule and office schedule, my office hours vary weekly. I am, however, available for emergency calls everyday through my office. If I am not available for an urgent office appointment, another one of our gastroenterologist will be able to assist you.  My well-trained staff are prepared to help you at all times. For emergencies after office hours, a physician from our Gastroenterology section is always available through my 24 hour answering service  Once again I welcome you as a new patient and I look forward to a happy and healthy relationship             Sincerely,  Louis Meckel MD  This letter has been electronically signed by your physician.  Appended Document: Results Letter mailed

## 2010-12-15 NOTE — Assessment & Plan Note (Signed)
Summary: ABD PAIN/YF   History of Present Illness Visit Type: Initial Consult Primary GI MD: Melvia Heaps MD [facility] Primary Provider: Leotis Shames Todd,MD Requesting Provider: Leotis Shames Todd,MD Chief Complaint: Abdominal Pain, Consult for colonoscopy History of Present Illness:   Lori [name] is a pleasant 77 year old white female referred at the request of Dr. Tawanna Cooler for change in bowel habits.  She has developed  bouts of severe constipation and diarrhea.  The latter has been associated with incontinence.  This has been accompanied by severe crampy lower, pain.  She denies melena or hematochezia.  Weight has been stable.   GI Review of Systems    Reports abdominal pain.      Denies acid reflux, belching, bloating, chest pain, dysphagia with liquids, dysphagia with solids, heartburn, loss of appetite, nausea, vomiting, vomiting blood, weight loss, and  weight gain.      Reports change in bowel habits and  diarrhea.     Denies anal fissure, black tarry stools, constipation, diverticulosis, fecal incontinence, heme positive stool, hemorrhoids, irritable bowel syndrome, jaundice, light color stool, liver problems, rectal bleeding, and  rectal pain.    Current Medications (verified): 1)  Asa 2)  Hydrochlorothiazide 12.5 Mg  Tabs (Hydrochlorothiazide) .... Take 1 Tablet By Mouth Every Morning 3)  Calcium-Vitamin D 500-200 Mg-Unit  Tabs (Calcium Carbonate-Vitamin D) .... Otc ?dose 4)  Zomig 2.5 Mg Tabs (Zolmitriptan) .... Uad For Migraine 5)  Vicodin Es 7.5-750 Mg Tabs (Hydrocodone-Acetaminophen) .... Take 1 Tablet By Mouth Four Times A Day As Needed 6)  Ciprofloxacin Hcl 250 Mg Tabs (Ciprofloxacin Hcl) .... Take 1 Tablet By Mouth Two Times A Day X 1 Week, Then 1 Daily X Bottle Empty  Allergies (verified): No Known Drug Allergies  Past History:  Past Medical History: Reviewed history from 09/02/2007 and no changes required. Osteoporosis peri edema mitral stenosis migraine headache hearing  loss appendectomy T. H. BSO bladder tacking pneumonia x 1 bilateral cataracts childbirth x 5  Past Surgical History: Reviewed history from 07/23/2007 and no changes required. CB X5 Appendectomy Cataract extraction, bilateral Hysterectomy (bleed)--ER Oophorectomy, salpingectomy--bilateral Tubal ligation, bilateral pneumonia  Family History: No FH of Colon Cancer:  Social History: Retired Married Never Smoked Alcohol use-no Drug use-no Regular exercise-yes 6 children  Review of Systems       The patient complains of allergy/sinus.  The patient denies anemia, anxiety-new, arthritis/joint pain, back pain, blood in urine, breast changes/lumps, change in vision, confusion, cough, coughing up blood, depression-new, fainting, fatigue, fever, headaches-new, hearing problems, heart murmur, heart rhythm changes, itching, menstrual pain, muscle pains/cramps, night sweats, nosebleeds, pregnancy symptoms, shortness of breath, skin rash, sleeping problems, sore throat, swelling of feet/legs, swollen lymph glands, thirst - excessive , urination - excessive , urination changes/pain, urine leakage, vision changes, and voice change.         All other systems were reviewed and were negative   Vital Signs:  Patient profile:   77 year old female Height:      60.25 inches Weight:      118 pounds BSA:     1.50 Pulse rate:   56 / minute Pulse rhythm:   regular BP sitting:   106 / 52  (left arm)  Vitals Entered By: Merri Ray CMA Duncan Dull) (February 28, 2010 1:21 PM)  Physical Exam  Additional Exam:  On physical exam she is an elderly healthy-appearing female  skin: anicteric HEENT: normocephalic; PEERLA; no nasal or pharyngeal abnormalities neck: supple nodes: no cervical lymphadenopathy chest: clear to ausculatation and  percussion heart: no murmurs, gallops, or rubs abd: soft, nontender; BS normoactive; no abdominal masses, organomegaly;  rectal: deferred ext: no cynanosis,  clubbing, edema skeletal: no deformities neuro: oriented x 3; no focal abnormalities    Impression & Recommendations:  Problem # 1:  ABDOMINAL PAIN, UNSPECIFIED SITE (ICD-789.00)  Abdominal pain and change in bowel habits could be related to a structural lesion of the colon.  Recommendations #1 colonoscopy  Risks, alternatives, and complications of the procedure, including bleeding, perforation, and possible need for surgery, were explained to the patient.  Patient's questions were answered.  Orders: Colonoscopy (Colon)  Patient Instructions: 1)  Copy sent to : Leotis Shames Todd,MD 2)  Colonoscopy and Flexible Sigmoidoscopy brochure given.  3)  Conscious Sedation brochure given.  4)  Your colonoscopy is scheduled for 03/10/2010 at 10:00am 5)  You can pick up your MoviPrep from your pharmacy today 6)  The medication list was reviewed and reconciled.  All changed / newly prescribed medications were explained.  A complete medication list was provided to the patient / caregiver. Prescriptions: MOVIPREP 100 GM  SOLR (PEG-KCL-NACL-NASULF-NA ASC-C) As per prep instructions.  #1 x 0   Entered by:   Merri Ray CMA (AAMA)   Authorized by:   Louis Meckel MD   Signed by:   Merri Ray CMA (AAMA) on 02/28/2010   Method used:   [facility]  [address] [id number]       [address]       [place], [place]  [address]       Ph: [phone] or [phone]       Fax: [phone]   RxID:   [id number]

## 2010-12-15 NOTE — Progress Notes (Signed)
Summary: Pt req script called in for headache pain  Phone Note Call from Patient Call back at [phone] Home    or [phone] Cell   Caller: Patient Summary of Call: Pt having headaches on upper right side of head down to behind her ear. Pt has been taking Alleive OTC. Pt req Dr. Tawanna Cooler [facility] [address]  Initial call taken by: Lucy Antigua,  November 16, 2009 1:20 PM  Follow-up for Phone Call        patient needs  ov  elevate tomorrow for evaluation of headaches if over-the-counter medication is not working Follow-up by: Roderick Pee MD,  November 16, 2009 2:15 PM  Additional Follow-up for Phone Call Additional follow up Details #1::        left message on machine for patient  Additional Follow-up by: Kern Reap CMA Duncan Dull),  November 16, 2009 2:22 PM

## 2010-12-15 NOTE — Assessment & Plan Note (Signed)
Summary: ?UTI/NJR   Vital Signs:  Patient profile:   77 year old female Weight:      118 pounds Temp:     97.8 degrees F oral BP sitting:   104 / 78  (left arm) Cuff size:   regular  Vitals Entered By: Kern Reap CMA Duncan Dull) (February 21, 2010 11:12 AM) CC: uti Is Patient Diabetic? No   CC:  uti.  History of Present Illness: Lori [name] is an 77 year old, married female, nonsmoker, who comes in today with a one-day history of fever, chills, frequency, and dysuria.  She has had a history of urinary tract and sections in the past.  She went to see her urologist, Dr. Shiela Mayer 6 weeks ago, because she thought she might have an infection.  They did an in and out catheter, and it was normal.  Urinalysis today shows moderate amount of blood, small, white cells nitrate negative  Allergies: No Known Drug Allergies  Past History:  Past medical, surgical, family and social histories (including risk factors) reviewed for relevance to current acute and chronic problems.  Past Medical History: Reviewed history from 09/02/2007 and no changes required. Osteoporosis peri edema mitral stenosis migraine headache hearing loss appendectomy T. H. BSO bladder tacking pneumonia x 1 bilateral cataracts childbirth x 5  Past Surgical History: Reviewed history from 07/23/2007 and no changes required. CB X5 Appendectomy Cataract extraction, bilateral Hysterectomy (bleed)--ER Oophorectomy, salpingectomy--bilateral Tubal ligation, bilateral pneumonia  Family History: Reviewed history and no changes required.  Social History: Reviewed history from 01/10/2008 and no changes required. Retired Married Never Smoked Alcohol use-no Drug use-no Regular exercise-yes  Review of Systems      See HPI  Physical Exam  General:  Well-developed,well-nourished,in no acute distress; alert,appropriate and cooperative throughout examination Abdomen:  Bowel sounds positive,abdomen soft and non-tender  without masses, organomegaly or hernias noted.   Impression & Recommendations:  Problem # 1:  ACUTE CYSTITIS (ICD-595.0) Assessment Deteriorated  Orders: UA Dipstick w/o Micro (manual) ([address]) Prescription Created Electronically [phone])  Her updated medication list for this problem includes:    Ciprofloxacin Hcl 250 Mg Tabs (Ciprofloxacin hcl) .Marland Kitchen... Take 1 tablet by mouth two times a day x 1 week, then 1 daily x bottle empty  Complete Medication List: 1)  Asa  2)  Hydrochlorothiazide 12.5 Mg Tabs (Hydrochlorothiazide) .... Take 1 tablet by mouth every morning 3)  Calcium-vitamin D 500-200 Mg-unit Tabs (Calcium carbonate-vitamin d) .... Otc ?dose 4)  Zomig 2.5 Mg Tabs (Zolmitriptan) .... Uad for migraine 5)  Vicodin Es 7.5-750 Mg Tabs (Hydrocodone-acetaminophen) .... Take 1 tablet by mouth four times a day as needed 6)  Ciprofloxacin Hcl 250 Mg Tabs (Ciprofloxacin hcl) .... Take 1 tablet by mouth two times a day x 1 week, then 1 daily x bottle empty  Patient Instructions: 1)  30 ounces of water daily, also pyridium one tablet 3 times a day for two to 3 days. 2)  Cipro one twice daily for one week and then one at bedtime to bottle empty.  Return p.r.n. Prescriptions: CIPROFLOXACIN HCL 250 MG TABS (CIPROFLOXACIN HCL) Take 1 tablet by mouth two times a day x 1 week, then 1 daily x bottle empty  #30 x 0   Entered and Authorized by:   Roderick Pee MD   Signed by:   Roderick Pee MD on 02/21/2010   Method used:   [facility]  [address] [phone]  [address]       [place], [place]  [address]       Ph: [phone] or [phone]       Fax: [phone]   RxID:   [id number]   Laboratory Results   Urine Tests  Date/Time Received: February 21, 2010   Routine Urinalysis   Color: yellow Appearance: Clear Glucose: negative   (Normal Range: Negative) Bilirubin: negative   (Normal Range: Negative) Ketone: negative   (Normal  Range: Negative) Spec. Gravity: <1.005   (Normal Range: 1.003-1.035) Blood: moderate   (Normal Range: Negative) pH: 8.5   (Normal Range: 5.0-8.0) Protein: 100   (Normal Range: Negative) Urobilinogen: 0.2   (Normal Range: 0-1) Nitrite: negative   (Normal Range: Negative) Leukocyte Esterace: small   (Normal Range: Negative)    Comments: Kern Reap CMA (AAMA)  February 21, 2010 3:50 PM

## 2010-12-15 NOTE — Progress Notes (Signed)
Summary: gi referral  Phone Note Call from Patient   Summary of Call: patient is requesting a referral to gi for abd pain  Follow-up for Phone Call        ok per dr Dreshawn Hendershott Follow-up by: Kern Reap CMA Duncan Dull),  January 28, 2010 11:01 AM  New Problems: ABDOMINAL PAIN, UNSPECIFIED SITE (ICD-789.00)   New Problems: ABDOMINAL PAIN, UNSPECIFIED SITE (ICD-789.00)

## 2010-12-15 NOTE — Assessment & Plan Note (Signed)
Summary: URI?dm   Vital Signs:  Patient profile:   77 year old [name] Height:      60.25 inches Weight:      109 pounds BMI:     21.19 Temp:     97.9 degrees F oral BP sitting:   110 / 80  (left arm) Cuff size:   regular  Vitals Entered By: Kern Reap CMA Duncan Dull) (October 26, 2010 10:00 AM) CC: chest congestion   Primary Care Provider:  Leotis Shames Todd,MD  CC:  chest congestion.  History of Present Illness: Lori [name] is an 77 year old [name], who comes in today for evaluation of two problems.  She recently got new hearing aids and can't hear out of her right ear.  The past, week she's had head congestion, postnasal drip, and cough.  She has a history of allergic rhinitis.  No fever no sputum production, etc.  Allergies: No Known Drug Allergies  Past History:  Past medical, surgical, family and social histories (including risk factors) reviewed, and no changes noted (except as noted below).  Past Medical History: Reviewed history from 09/02/2007 and no changes required. Osteoporosis peri edema mitral stenosis migraine headache hearing loss appendectomy T. H. BSO bladder tacking pneumonia x 1 bilateral cataracts childbirth x 5  Past Surgical History: Reviewed history from 07/23/2007 and no changes required. CB X5 Appendectomy Cataract extraction, bilateral Hysterectomy (bleed)--ER Oophorectomy, salpingectomy--bilateral Tubal ligation, bilateral pneumonia  Family History: Reviewed history from 02/28/2010 and no changes required. No FH of Colon Cancer:  Social History: Reviewed history from 02/28/2010 and no changes required. Retired Never Smoked Alcohol use-no Drug use-no Regular exercise-yes 6 children Widow/Widower  Review of Systems      See HPI  Physical Exam  General:  Well-developed,well-nourished,in no acute distress; alert,appropriate and cooperative throughout examination Head:  Normocephalic and atraumatic without obvious abnormalities.  No apparent alopecia or balding. Eyes:  No corneal or conjunctival inflammation noted. EOMI. Perrla. Funduscopic exam benign, without hemorrhages, exudates or papilledema. Vision grossly normal. Ears:  left ear canal normal right ear canal.  Totally packed with wax Nose:  External nasal examination shows no deformity or inflammation. Nasal mucosa are pink and moist without lesions or exudates. Mouth:  Oral mucosa and oropharynx without lesions or exudates.  Teeth in good repair. Neck:  No deformities, masses, or tenderness noted. Lungs:  decreased but symmetrical.  Breath sounds, slight expiratory wheezing   Problems:  Medical Problems Added: 1)  Dx of Cerumen Impaction  (ICD-380.4) 2)  Dx of Asthma With COPD  (ICD-493.20)  Impression & Recommendations:  Problem # 1:  CERUMEN IMPACTION (ICD-380.4) Assessment New  Problem # 2:  ASTHMA WITH COPD (ICD-493.20) Assessment: New  Her updated medication list for this problem includes:    Prednisone 20 Mg Tabs (Prednisone) ..... Uad  Complete Medication List: 1)  Asa  2)  Hydrochlorothiazide 12.5 Mg Tabs (Hydrochlorothiazide) .... Take 1 tablet by mouth every morning 3)  Calcium-vitamin D 500-200 Mg-unit Tabs (Calcium carbonate-vitamin d) .... Otc ?dose 4)  Zomig 2.5 Mg Tabs (Zolmitriptan) .... Uad for migraine 5)  Vicodin Es 7.5-750 Mg Tabs (Hydrocodone-acetaminophen) .... Take 1 tablet by mouth four times a day as needed 6)  Prednisone 20 Mg Tabs (Prednisone) .... Uad 7)  Hydromet 5-1.5 Mg/77ml Syrp (Hydrocodone-homatropine) .... 1/2 tsp at bedtime as needed cough  Patient Instructions: 1)  prednisone one tablet x 3 days, a half x 3 days, then half a tablet every other day for two week taper. 2)  Return next Tuesday  for follow-up 3)  Hydromet 0.5-teaspoon at bedtime as needed for nighttime cough Prescriptions: HYDROMET 5-1.5 MG/5ML SYRP (HYDROCODONE-HOMATROPINE) 1/2 tsp at bedtime as needed cough  #4oz x 1   Entered and Authorized  by:   Roderick Pee MD   Signed by:   Roderick Pee MD on 10/26/2010   Method used:   Print then Give to Patient   RxID:   [id number] PREDNISONE 20 MG TABS (PREDNISONE) UAD  #30 x 1   Entered and Authorized by:   Roderick Pee MD   Signed by:   Roderick Pee MD on 10/26/2010   Method used:   [facility]  [address] [id number]       [address]       [place], [place]  [address]       Ph: [phone] or [phone]       Fax: [phone]   RxID:   [id number]    Orders Added: 1)  Est. Patient Level IV [[address]]

## 2010-12-15 NOTE — Letter (Signed)
Summary: Patient Notice-Hyperplastic Polyps  [facility]  [address] [place], [place] [address]   Phone: [phone]  Fax: [phone]        Mar 14, 2010 MRN: [id number]    Lori [name] [address] [place], [place]  [address]    Dear Ms. Nephew,  I am pleased to inform you that the colon polyp(s) removed during your recent colonoscopy was (were) found to be hyperplastic.  These types of polyps are NOT pre-cancerous.  It is therefore my recommendation that you have a repeat colonoscopy examination in _ years for routine colorectal cancer screening.  Should you develop new or worsening symptoms of abdominal pain, bowel habit changes or bleeding from the rectum or bowels, please schedule an evaluation with either your primary care physician or with me.  Additional information/recommendations:  _x_No further action with gastroenterology is needed at this time.      Please follow-up with your primary care physician for your other      healthcare needs. __Please call [phone] to schedule a return visit to review      your situation.  __Please keep your follow-up visit as already scheduled.  __Continue treatment plan as outlined the day of your exam.  Please call us if you are having persistent problems or have questions about your condition that have not been fully answered at this time.  Sincerely,  Louis Meckel MD This letter has been electronically signed by your physician.  Appended Document: Patient Notice-Hyperplastic Polyps letter mailed 5.3.11

## 2010-12-15 NOTE — Letter (Signed)
Summary: [facility] Instructions  [facility]  [address] [place], [place] [address]   Phone: [phone]  Fax: [phone]       Lori [name]    04/24/1920    MRN: [id number]        Procedure Day /Date:THURSDAY 03/10/2010     Arrival Time:9AM     Procedure Time:10AM     Location of Procedure:                    X   [facility] (4th Floor)   PREPARATION FOR COLONOSCOPY WITH MOVIPREP   Starting 5 days prior to your procedure 03/05/2010 do not eat nuts, seeds, popcorn, corn, beans, peas,  salads, or any raw vegetables.  Do not take any fiber supplements (e.g. Metamucil, Citrucel, and Benefiber).  THE DAY BEFORE YOUR PROCEDURE         DATE: 03/09/2010  DAY: WED  1.  Drink clear liquids the entire day-NO SOLID FOOD  2.  Do not drink anything colored red or purple.  Avoid juices with pulp.  No orange juice.  3.  Drink at least 64 oz. (8 glasses) of fluid/clear liquids during the day to prevent dehydration and help the prep work efficiently.  CLEAR LIQUIDS INCLUDE: Water Jello Ice Popsicles Tea (sugar ok, no milk/cream) Powdered fruit flavored drinks Coffee (sugar ok, no milk/cream) Gatorade Juice: apple, white grape, white cranberry  Lemonade Clear bullion, consomm, broth Carbonated beverages (any kind) Strained chicken noodle soup Hard Candy                             4.  In the morning, mix first dose of MoviPrep solution:    Empty 1 Pouch A and 1 Pouch B into the disposable container    Add lukewarm drinking water to the top line of the container. Mix to dissolve    Refrigerate (mixed solution should be used within 24 hrs)  5.  Begin drinking the prep at 5:00 p.m. The MoviPrep container is divided by 4 marks.   Every 15 minutes drink the solution down to the next mark (approximately 8 oz) until the full liter is complete.   6.  Follow completed prep with 16 oz of clear liquid of your choice (Nothing red or purple).  Continue to  drink clear liquids until bedtime.  7.  Before going to bed, mix second dose of MoviPrep solution:    Empty 1 Pouch A and 1 Pouch B into the disposable container    Add lukewarm drinking water to the top line of the container. Mix to dissolve    Refrigerate  THE DAY OF YOUR PROCEDURE      DATE: 03/10/2010 GUR:[id number]  Beginning at 5a.m. (5 hours before procedure):         1. Every 15 minutes, drink the solution down to the next mark (approx 8 oz) until the full liter is complete.  2. Follow completed prep with 16 oz. of clear liquid of your choice.    3. You may drink clear liquids until 8:00AM (2 HOURS BEFORE PROCEDURE).   MEDICATION INSTRUCTIONS  Unless otherwise instructed, you should take regular prescription medications with a small sip of water   as early as possible the morning of your procedure.          OTHER INSTRUCTIONS  You will need a responsible adult at least 75 years of age to accompany you  and drive you home.   This person must remain in the waiting room during your procedure.  Wear loose fitting clothing that is easily removed.  Leave jewelry and other valuables at home.  However, you may wish to bring a book to read or  an iPod/MP3 player to listen to music as you wait for your procedure to start.  Remove all body piercing jewelry and leave at home.  Total time from sign-in until discharge is approximately 2-3 hours.  You should go home directly after your procedure and rest.  You can resume normal activities the  day after your procedure.  The day of your procedure you should not:   Drive   Make legal decisions   Operate machinery   Drink alcohol   Return to work  You will receive specific instructions about eating, activities and medications before you leave.    The above instructions have been reviewed and explained to me by   _______________________    I fully understand and can verbalize these instructions  _____________________________ Date _________

## 2010-12-21 NOTE — Assessment & Plan Note (Signed)
Summary: 4 day rov/njr   Vital Signs:  Patient profile:   77 year old female Weight:      110 pounds Temp:     97.6 degrees F oral BP sitting:   110 / 72  (left arm) Cuff size:   regular  Vitals Entered By: Kern Reap CMA Duncan Dull) (December 13, 2010 11:10 AM) CC: follow-up visit   Primary Care Provider:  Leotis Shames Joy Haegele,MD  CC:  follow-up visit.  History of Present Illness: Lori [name] is 21 -year-old recently widowed female, who comes in today for follow-up of asthma.  We saw her last week for a physical at the time she was wheezing.  We start on prednisone 20 mg daily.  Now she's saying she feels back to baseline.  No side effects from medication  Allergies: No Known Drug Allergies  Past History:  Past medical, surgical, family and social histories (including risk factors) reviewed for relevance to current acute and chronic problems.  Past Medical History: Reviewed history from 09/02/2007 and no changes required. Osteoporosis peri edema mitral stenosis migraine headache hearing loss appendectomy T. H. BSO bladder tacking pneumonia x 1 bilateral cataracts childbirth x 5  Past Surgical History: Reviewed history from 07/23/2007 and no changes required. CB X5 Appendectomy Cataract extraction, bilateral Hysterectomy (bleed)--ER Oophorectomy, salpingectomy--bilateral Tubal ligation, bilateral pneumonia  Family History: Reviewed history from 02/28/2010 and no changes required. No FH of Colon Cancer:  Social History: Reviewed history from 10/26/2010 and no changes required. Retired Never Smoked Alcohol use-no Drug use-no Regular exercise-yes 6 children Widow/Widower  Review of Systems      See HPI  Physical Exam  General:  Well-developed,well-nourished,in no acute distress; alert,appropriate and cooperative throughout examination Lungs:  decreased breast sounds are usual however, no wheezing   Impression & Recommendations:  Problem # 1:  ASTHMA WITH COPD  (ICD-493.20) Assessment Improved  Her updated medication list for this problem includes:    Prednisone 20 Mg Tabs (Prednisone) ..... Uad  Complete Medication List: 1)  Asa  2)  Hydrochlorothiazide 12.5 Mg Tabs (Hydrochlorothiazide) .... Take 1 tablet by mouth every morning 3)  Calcium-vitamin D 500-200 Mg-unit Tabs (Calcium carbonate-vitamin d) .... Otc ?dose 4)  Zomig 2.5 Mg Tabs (Zolmitriptan) .... Uad for migraine 5)  Vicodin Es 7.5-750 Mg Tabs (Hydrocodone-acetaminophen) .... Take 1 tablet by mouth four times a day as needed 6)  Prednisone 20 Mg Tabs (Prednisone) .... Uad 7)  Hydromet 5-1.5 Mg/70ml Syrp (Hydrocodone-homatropine) .... 1/2 tsp at bedtime as needed cough  Patient Instructions: 1)  begin to taper the prednisone by taking one tablet daily for 5 days, a half a tablet daily for 5 days, then half a tablet Monday, Wednesday, Friday, for a two week taper.  Return in 3 weeks for follow-up   Orders Added: 1)  Est. Patient Level III [16109]

## 2011-01-02 ENCOUNTER — Encounter: Payer: Self-pay | Admitting: Family Medicine

## 2011-01-03 ENCOUNTER — Ambulatory Visit (INDEPENDENT_AMBULATORY_CARE_PROVIDER_SITE_OTHER): Payer: PRIVATE HEALTH INSURANCE | Admitting: Family Medicine

## 2011-01-03 ENCOUNTER — Encounter: Payer: Self-pay | Admitting: Family Medicine

## 2011-01-03 DIAGNOSIS — J449 Chronic obstructive pulmonary disease, unspecified: Secondary | ICD-10-CM

## 2011-01-03 NOTE — Progress Notes (Signed)
  Subjective:    Patient ID: Lori [name], female    DOB: Aug 11, 1920, 77 y.o.   MRN: [id number]  HPI Lori [name] Is 77 year old female, who comes in today for follow-up of asthma.  We have slowly tapered.  Her prednisone.  She is now down to 10 mg Monday, Wednesday, Friday.  This is her second week of tapering.  She feels well.  No wheezing   Review of Systems    Negative Objective:   Physical Exam Well-developed well-nourished, female in no acute distress.  Examination of the HEENT were negative.  Neck was supple.  No adenopathy.  Lungs shows a small chest cavity with symmetrical breath sounds and no wheezing       Assessment & Plan:  Asthma,,,,,,,, resolved  Plan finish prednisone return p.r.n.

## 2011-01-03 NOTE — Patient Instructions (Signed)
Finish the prednisone as we discussed return p.r.n.

## 2011-01-04 ENCOUNTER — Telehealth: Payer: Self-pay | Admitting: *Deleted

## 2011-01-04 NOTE — Telephone Encounter (Signed)
In the office yesterday, and her weight was 122 lbs but it was really 112.  Can you change this?

## 2011-01-05 NOTE — Telephone Encounter (Signed)
Left message on machine for patient  That a note will be attached to office visit

## 2011-03-31 NOTE — Op Note (Signed)
[facility]  Patient:    Lori [name], Lori [name] Visit Number: [id number] MRN: [id number]          Service Type: GYN Location: 9300 [id number] Attending Physician:  Wetzel Bjornstad Dictated by:   Katy Fitch, M.D. Proc. Date: 04/24/02 Admit Date:  04/24/2002 Discharge Date: 04/26/2002                             Operative Report  PREOPERATIVE DIAGNOSES:       1. Pelvic organ prolapse.                               2. Urinary retention.  POSTOPERATIVE DIAGNOSES:      1. Pelvic organ prolapse.                               2. Urinary retention.  PROCEDURES:                   1. Total vaginal hysterectomy.                               2. Bilateral salpingo-oophorectomy.                               3. Anterior repair.  SURGEON:                      Katy Fitch, M.D.  ASSISTANTGaetano Hawthorne. [name], M.D.  ANESTHESIA:                   General.  ESTIMATED BLOOD LOSS:         50 cc.  URINE OUTPUT:                 1000.  FLUIDS:                       1600 crystalloid.  COMPLICATIONS:                None.  SPECIMENS:                    1. Uterus.                               2. Bilateral tubes.                               3. Bilateral ovaries.  FINDINGS:                     Normal-appearing uterus and tubes with atrop[hic ovaries.  Grade 3 cystocele.  DESCRIPTION OF PROCEDURE:     The patient was taken to the operating room, where general anesthesia was administered.  The patient was then placed in Redmon stirrups.  The patient was then prepped and draped in a normal sterile fashion.  A Foley catheter was introduced into the bladder.  A weighted speculum was placed in the vagina  and a Jacobs tenaculum was placed on the anterior lip of the cervix.  Pitressin solution, 20 units in 60 cc of saline, was then injected circumferentially in the cervicovaginal mucosa.  This was then incised circumferentially down to the  pubovesical cervical fascia.  The vaginal mucosa was then bluntly dissected off along with using sharp dissection with curved Mayo scissors.  Entrance to the posterior cul-de-sac was then obtained sharply by using curved Mayo scissors.  Then, a long weighted speculum was placed in the posterior cul-de-sac.  Entrance into the anterior cul-de-sac  was then performed by having a finger go through the posterior cul-de-sac, reaching around the fundus of the uterus, finding the anterior cul-de-sac and then sharp dissection with Metzenbaum scissors.  Once entrance into the anterior cul-de-sac was performed, a Deaver retractor was placed between the bladder and the uterus.  The long weighted speculum was placed back in the posterior cul-de-sac.  The uterosacral ligaments were then grasped on the left with a Rogers clamp, ligated with 0 Vicryl and held with a hemostat.  This was done similarly on the right.  The uterine vessels were then clamped on the left and then on the right, then suture ligated with 0 Vicryl.  Finally, the utero-ovarian ligatures were then cross clamped and ligated with 0 Vicryl suture.  The ovaries and tubes were then identified on the left.  A second Heaney clamp was placed behind that.  The tubes and ovaries were then dissected off and sent for pathology.  The infundibulopelvic ligament was then doubly ligated, first with a free tie and then with a suture of 0 Vicryl.  This was done similarly on the right.  The pelvis was then irrigated.  A warm, moist sponge was placed in the cul-de-sac.  Inspection, all the pedicles were noted to be hemostatic.  A running 0 Vicryl stitch was used to close the posterior cuff from uterosacral to uterosacral.  At this point, opening the anterior vaginal mucosa was then performed by holding the edge with Allis clamps.  The mucosa was dissected off sharply with Metzenbaum scissors to approximately 1.5 cm below the urethral meatus.  The mucosal  edges were grasped along the way with T clamps.  The underlying pubovesical fascia was dissected off of the mucosa.  Once dissection was obtained, 2-0 interrupted sutures were then used to reinforce the bladder in interrupted fashion.  The vaginal mucosa was then trimmed.  The mucosa was then closed in a running interlocking stitch of 2-0 Vicryl down to the cuff.  The cuff was then closed with four interrupted vertical mattress sutures of 0 Vicryl. Vaginal pack was placed with 1-inch plain with Estradiol cream.  The patient was taken to the recovery room in stable condition. Dictated by:   Katy Fitch, M.D. Attending Physician:  Wetzel Bjornstad DD:  04/24/02 TD:  04/26/02 Job: [id number]

## 2011-03-31 NOTE — Discharge Summary (Signed)
[facility]  Patient:    Lori [name], Lori [name] Visit Number: [id number] MRN: [id number]          Service Type: GYN Location: 9300 [id number] [id number] Attending Physician:  Wetzel Bjornstad Dictated by:   Antony Contras, [facility] Admit Date:  04/24/2002 Discharge Date: 04/26/2002                             Discharge Summary  DISCHARGE DIAGNOSES:          1. Pelvic organ prolapse.                               2. Urinary retention.  PROCEDURES:                   1. Total vaginal hysterectomy.                               2. Bilateral salpingo-oophorectomy.                               3. Anterior repair.  HISTORY OF PRESENT ILLNESS:   The patient is a 77 year old, gravida 6, para 6, referred by Maretta Bees. Vonita Moss, M.D., secondary to pelvic pressure and cystocele.  The patient had been seeing Dr. Vonita Moss for urinary retention. She had also been doing self-catheterizations intermittently, getting anywhere from 30-40 ounces.  She denies any neurological history.  She has been getting more symptomatic.  PAST MEDICAL HISTORY:         History of urinary retention and hypertension.  PAST SURGICAL HISTORY:        1. Appendectomy.                               2. Cesarean section.                               3. Breast cyst removal.                               4. Bilateral tubal ligation.  MEDICATIONS:                  1. Hydrochlorothiazide 25 mg q.d.                               2. Aspirin q.d.  ALLERGIES:                    The patient has no known drug allergies.  HOSPITAL COURSE:              The patient was admitted for total vaginal hysterectomy, bilateral salpingo-oophorectomy, and anterior repair performed by Katy Fitch, M.D., assisted by Gaetano Hawthorne. [name], M.D., under general anesthesia.  Findings included normal uterus and tubes, atrophic ovaries, and grade 3 cystocele.  Postoperatively, the patient had no fever or other complications other  than being unable to void.  LABORATORY DATA:              CBC with hematocrit 31.2, hemoglobin 10.6, WBC 7.7,  and platelets 204.  DISPOSITION:                  She was able to be discharged on her third postoperative day in satisfactory condition.  FOLLOW-UP:                    The patient is to follow up in six weeks to evaluate urinary status. Dictated by:   Antony Contras, [facility] Attending Physician:  Wetzel Bjornstad DD:  05/29/02 TD:  06/04/02 Job: [id number]

## 2011-03-31 NOTE — H&P (Signed)
[facility]  Patient:    Lori [name], SUBLETTE Visit Number: [id number] MRN: [id number]          Service Type: Attending:  Katy Fitch, M.D. Dictated by:   Katy Fitch, M.D. Adm. Date:  03/17/02                           History and Physical  CHIEF COMPLAINT:              Pelvic organ prolapse.  HISTORY OF PRESENT ILLNESS:   The patient is a 77 year old, G6, P6, referred by Dr. Vonita Moss, secondary to pelvic pressure and cystocele.  The patient had been seeing Dr. Vonita Moss for urinary retention and felt that a correction of her cystocele may help her retention.  The patient has been doing self catheterizations intermittently and getting anywhere from 30-40 ounces at a time.  The patient denies any neurological history in the past and has been getting more symptomatic.  PAST MEDICAL HISTORY:         1. Urinary retention.                               2. Hypertension.  PAST SURGICAL HISTORY:        1. Appendectomy in 1933.                               2. Cesarean section in 1957 for a previa.                               3. Breast cyst removal in 1982.                               4. Bilateral tubal ligation.  MEDICATIONS:                  1. Hydrochlorothiazide 25 mg q.d.                               2. Aspirin q.d.  ALLERGIES:                    No known drug allergies.  SOCIAL HISTORY:               Without any tobacco, alcohol, or drugs.  FAMILY HISTORY:               Without any epithelial cancers.  PHYSICAL EXAMINATION:  VITAL SIGNS:                  Blood pressure 122/70.  HEENT:                        Clear.  LUNGS:                        Clear to auscultation bilaterally.  HEART:                        Regular rate and rhythm.  ABDOMEN:  Soft, nontender.  No palpable masses.  EXTREMITIES:                  Without any edema, clubbing, cyanosis, or tenderness.  PELVIC:                       Grade  3 cystocele.  Uterus with second-degree prolapse.  ASSESSMENT AND PLAN:          Pelvic organ prolapse.  Plan to do a total vaginal hysterectomy with bilateral salpingo-oophorectomy and cystocele repair.  The patient was explained the risks of the procedure, including bleeding, transfusion, hepatitis or human immunodeficiency virus, infection, scar tissue, wound breakdown, damage to internal organs, need for a laparotomy, need for indwelling catheter postsurgery, fistula formation, anesthetic complications, blood clots, stroke, pulmonary embolism, heart attack, or death.  The patient states she understands these risks and desires to proceed.  The patient will stop her aspirin and hydrochlorothiazide 48 hours preoperatively. Dictated by:   Katy Fitch, M.D. Attending:  Katy Fitch, M.D. DD:  03/17/02 TD:  03/17/02 Job: [id number]

## 2011-05-25 ENCOUNTER — Encounter: Payer: Self-pay | Admitting: Family Medicine

## 2011-05-25 ENCOUNTER — Ambulatory Visit (INDEPENDENT_AMBULATORY_CARE_PROVIDER_SITE_OTHER): Payer: PRIVATE HEALTH INSURANCE | Admitting: Family Medicine

## 2011-05-25 VITALS — BP 110/66 | Temp 98.2°F | Wt 111.0 lb

## 2011-05-25 DIAGNOSIS — M549 Dorsalgia, unspecified: Secondary | ICD-10-CM

## 2011-05-25 MED ORDER — HYDROCODONE-ACETAMINOPHEN 7.5-750 MG PO TABS: ORAL_TABLET | ORAL | Status: DC

## 2011-05-25 NOTE — Patient Instructions (Signed)
Vicodin ES one half to one tablet every 4 to 6 hours as needed for severe pain.  Return p.r.n.

## 2011-05-25 NOTE — Progress Notes (Signed)
  Subjective:    Patient ID: Lori [name], female    DOB: Mar 27, 1920, 77 y.o.   MRN: [id number]  Lori [name] is a delightful, 77 year old, widowed female, who comes in today for evaluation of back pain.  Last night.  She rolled over in bed and experienced severe left upper back pain.  No shortness of breath.  She's had this problem in the past.    Review of Systems    General unless it is felt the review of systems otherwise negative Objective:   Physical Exam    Well-developed well-nourished, female, in no acute distress.  Examination spine shows no palpable tenderness.  There is palpable tenderness between the 11th and 12th ribs posteriorly to the left    Assessment & Plan:  Chest wall pain.  Plan Vicodin one half tab q.4 h. P.r.n.

## 2011-06-17 ENCOUNTER — Encounter: Payer: Self-pay | Admitting: Family Medicine

## 2011-06-17 ENCOUNTER — Telehealth: Payer: Self-pay | Admitting: Family Medicine

## 2011-06-17 ENCOUNTER — Ambulatory Visit (INDEPENDENT_AMBULATORY_CARE_PROVIDER_SITE_OTHER): Payer: PRIVATE HEALTH INSURANCE | Admitting: Family Medicine

## 2011-06-17 VITALS — BP 110/68 | HR 84 | Temp 97.6°F | Wt 105.0 lb

## 2011-06-17 DIAGNOSIS — N3000 Acute cystitis without hematuria: Secondary | ICD-10-CM

## 2011-06-17 LAB — POCT URINALYSIS DIPSTICK
Bilirubin, UA: UNDETERMINED
Blood, UA: NEGATIVE
Ketones, UA: UNDETERMINED
Nitrite, UA: UNDETERMINED

## 2011-06-17 MED ORDER — CIPROFLOXACIN HCL 500 MG PO TABS: 500.0000 mg | ORAL_TABLET | Freq: Two times a day (BID) | ORAL | Status: DC

## 2011-06-17 NOTE — Telephone Encounter (Signed)
Please call for update.  Advise patient or daughter that I thought we could send culture in Saturday clinic, but apparently were unable to.   Especially imprtant to f/u with [facility] to see what culture shows from last week to ensure on right antibiotic.

## 2011-06-17 NOTE — Assessment & Plan Note (Addendum)
No records available from urology at [facility].  Pt attributes vomiting to pyridium. UA - unreadable 2/2 pyridium.  Culture sent. Given duration of sxs, treat with cipro x 7 days. Red flags to go to ER discussed.  Addendum: Found unable to send culture from Saturday clinic, cancelled order in chart.

## 2011-06-17 NOTE — Progress Notes (Signed)
Addended by: Eustaquio Boyden on: 06/17/2011 06:07 PM   Modules accepted: Orders

## 2011-06-17 NOTE — Progress Notes (Signed)
  Subjective:    Patient ID: Lori [name], female    DOB: 21-Jul-1920, 77 y.o.   MRN: [id number]  HPI CC: possible UTI?  Presents with daughter.  Went sometime last week to [facility] (Monday or Tuesday).  Saw NP, h/o recurrent UTIs.  Does home catheterizations when worried about infection.  In past has been prescribed cipro.  This time given pyridium, no abx.  States they did study and sent culture.  Never head anything back from them.  Stopped taking pyridium yesterday, not helping pain.  Endorses continued abd pain suprapubically, then goes into shivers.  Started having sxs 9 d ago.  + dysuria, urgency and frequency.  No blood in urine, no fevers/chills.  Did vomit this morning and last night.  Thinks because of this medicine.  No flank pain.  Not on prednisone recently.  Medications and allergies reviewed and updated in chart.    Last UTI was >3 mo ago.  Lab Results  Component Value Date   CREATININE 0.6 12/09/2010   Review of Systems Per HPI    Objective:   Physical Exam  Nursing note and vitals reviewed. Constitutional: She appears well-developed and well-nourished. No distress.  HENT:  Mouth/Throat: Oropharynx is clear and moist. No oropharyngeal exudate.  Cardiovascular: Normal rate, regular rhythm, normal heart sounds and intact distal pulses.   No murmur heard. Pulmonary/Chest: Effort normal and breath sounds normal. No respiratory distress. She has no wheezes. She has no rales.  Abdominal: Soft. Bowel sounds are normal. She exhibits no distension and no mass. There is tenderness (suprapubic). There is no rebound, no guarding and no CVA tenderness.  Skin: Skin is warm and dry. No rash noted.  Psychiatric: She has a normal mood and affect.          Assessment & Plan:

## 2011-06-17 NOTE — Patient Instructions (Signed)
Concern for urinary infection.  Urine culture sent. Treat with cipro twice daily for 7 days. Push fluids and rest, cranberry juice. Call [facility] this week to follow up. Go to ER if fever >101,worsening pain, worsening nausea/vomiting, or concerns.

## 2011-06-19 ENCOUNTER — Telehealth: Payer: Self-pay | Admitting: Family Medicine

## 2011-06-19 NOTE — Telephone Encounter (Signed)
Mom may have diverticulitis again. 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 Please advise daughter at [phone]. Thanks.

## 2011-06-19 NOTE — Telephone Encounter (Signed)
Spoke with patient and notified her that we were unable to send UCx. Advised her to call [facility] to get the results from their culture and make sure that she is taking the correct abx to take care of UTI. She verbalized understanding after careful explanation. I did speak to her daughter who said she spoke with [facility] who said the culture they did came back negative and that is why they didn't prescribe abx. I told her daughter to make sure she finishes the abx and to call Dr. Nelida Meuse office or [facility] if symptoms return. She verbalized understanding.

## 2011-06-19 NOTE — Telephone Encounter (Signed)
Okay to schedule tomorrow at 8:15. Left message on machine for daughter.

## 2011-06-20 ENCOUNTER — Encounter: Payer: Self-pay | Admitting: Family Medicine

## 2011-06-20 ENCOUNTER — Ambulatory Visit (INDEPENDENT_AMBULATORY_CARE_PROVIDER_SITE_OTHER): Payer: PRIVATE HEALTH INSURANCE | Admitting: Family Medicine

## 2011-06-20 VITALS — BP 110/70 | Temp 97.6°F | Wt 105.0 lb

## 2011-06-20 DIAGNOSIS — N390 Urinary tract infection, site not specified: Secondary | ICD-10-CM

## 2011-06-20 DIAGNOSIS — N3000 Acute cystitis without hematuria: Secondary | ICD-10-CM

## 2011-06-20 LAB — POCT URINALYSIS DIPSTICK
Ketones, UA: NEGATIVE
Protein, UA: 100
Spec Grav, UA: 1.015
Urobilinogen, UA: 0.2

## 2011-06-20 MED ORDER — CIPROFLOXACIN HCL 500 MG PO TABS: ORAL_TABLET | ORAL | Status: DC

## 2011-06-20 NOTE — Patient Instructions (Signed)
Take Cipro at one twice daily for 10 more days, then one at bedtime x 3 weeks.  Follow-up here in 4 to 5 weeks

## 2011-06-20 NOTE — Progress Notes (Signed)
  Subjective:    Patient ID: Lori [name], female    DOB: 01/17/1920, 77 y.o.   MRN: [id number]  HPInina Is a 77 year old female, who comes in today for follow-up of urinary tract infection,  She's had recurrent urinary tract infections and has had numerous urologic studies all of which were negative except he does have a prolapsed bladder.  Her urologist is Dr. Shiela Mayer.  He advised no surgery just medical treatment.  She was seen in the Saturday clinic with symptoms of dysuria and frequency.  Plan have UTI and put on Cipro.  She's been on one twice daily since that time.  She feels better, but still has some symptoms of burning.  No fever, chills, or back pain.    Review of Systems    General and neurologic review of systems otherwise negative except for above Objective:   Physical Exam  Well-developed and nourished, female no acute distress.  Urinalysis shows persistent Blood and white cells nitrate positive      Assessment & Plan:  Chronic and recurrent urinary tract infections.  Plan continue Cipro 500 b.i.d. X 10 days, then 500 t.i.d. X 3 weeks.  Urine culture pending.  A follow-up in 4 weeks here

## 2011-06-22 LAB — URINE CULTURE: Colony Count: NO GROWTH

## 2011-07-24 ENCOUNTER — Ambulatory Visit (INDEPENDENT_AMBULATORY_CARE_PROVIDER_SITE_OTHER): Payer: PRIVATE HEALTH INSURANCE | Admitting: Family Medicine

## 2011-07-24 ENCOUNTER — Encounter: Payer: Self-pay | Admitting: Family Medicine

## 2011-07-24 DIAGNOSIS — Z23 Encounter for immunization: Secondary | ICD-10-CM

## 2011-07-24 DIAGNOSIS — N390 Urinary tract infection, site not specified: Secondary | ICD-10-CM

## 2011-07-24 DIAGNOSIS — N3000 Acute cystitis without hematuria: Secondary | ICD-10-CM

## 2011-07-24 LAB — POCT URINALYSIS DIPSTICK
Bilirubin, UA: NEGATIVE
Blood, UA: NEGATIVE
Glucose, UA: NEGATIVE
Nitrite, UA: NEGATIVE
Spec Grav, UA: 1.01
Urobilinogen, UA: 0.2

## 2011-07-24 NOTE — Progress Notes (Signed)
  Subjective:    Patient ID: Lori [name], female    DOB: May 24, 1920, 77 y.o.   MRN: [id number]  HPI Toryn is a 77 year old female, who comes in today for follow-up of acute cystitis with hematuria and lightheadedness.  The infection was treated with Cipro now.  Urine is clear.  Her blood pressure runs 110/68.  She is on hydrochlorothiazide 12.5 mg daily.  She is having spells of lightheadedness when she changes position or stands up.  No syncope   Review of Systems    General and cardiovascular and neurologic review of systems otherwise negative Objective:   Physical Exam Well-developed well-nourished, female, in no  acute distress.  BP 110/64 right arm sitting position       Assessment & Plan:  Hematuria resolved.  Lightheadedness, DC hydrochlorothiazide increase salt intake

## 2011-07-24 NOTE — Patient Instructions (Signed)
Stop the hydrochlorothiazide.  Drink lots of water.  Cranberry pills.......... Take one tablet daily to prevent bladder infections

## 2011-10-23 ENCOUNTER — Encounter: Payer: Self-pay | Admitting: Family Medicine

## 2011-10-23 ENCOUNTER — Ambulatory Visit (INDEPENDENT_AMBULATORY_CARE_PROVIDER_SITE_OTHER): Payer: PRIVATE HEALTH INSURANCE | Admitting: Family Medicine

## 2011-10-23 ENCOUNTER — Ambulatory Visit (INDEPENDENT_AMBULATORY_CARE_PROVIDER_SITE_OTHER)
Admission: RE | Admit: 2011-10-23 | Discharge: 2011-10-23 | Disposition: A | Discharge status: Home / Self Care | Payer: PRIVATE HEALTH INSURANCE | Source: Ambulatory Visit | Attending: Family Medicine | Admitting: Family Medicine

## 2011-10-23 VITALS — BP 110/64 | Temp 97.9°F | Wt 110.0 lb

## 2011-10-23 DIAGNOSIS — M549 Dorsalgia, unspecified: Secondary | ICD-10-CM

## 2011-10-23 LAB — POCT URINALYSIS DIPSTICK
Glucose, UA: NEGATIVE
Ketones, UA: NEGATIVE
Protein, UA: NEGATIVE
Spec Grav, UA: 1.015
Urobilinogen, UA: 0.2

## 2011-10-23 NOTE — Patient Instructions (Signed)
Go to the main office now for your chest x-ray.  Then go home and take Vicodin one half tab q.4 h. P.r.n. For pain.  I will call you the report ASAP

## 2011-10-23 NOTE — Progress Notes (Signed)
  Subjective:    Patient ID: Lori [name], female    DOB: 01-Sep-1920, 77 y.o.   MRN: [id number]  HPI Lori [name] 77 year old female Widowed x 2, who comes in today for evaluation of back pain.  She, states she's had a history of nontraumatic left lower thoracic back pain for two weeks.  She states that yesterday her pain got worse.  She describes as sometimes sharp sometimes dull, a 7 on a scale of one to 10.  It's constant and does not radiate.  She's had no fever, chills, nausea, vomiting, or diarrhea.  She's had no shortness of breath or cough.  No history of trauma.  No history of kidney stones.   Review of Systems In general, cardiovascular, pulmonary, GI, and GU review of systems all negative   Objective:   Physical Exam  Well-developed well-nourished, female, in no acute distress.  Examination of the lungs shows the breast sounds to be normal.  There [name] palpable tenderness left posterior eighth ninth and 10th ribs.  No rash.  Abdomen otherwise, negative.  Urinalysis shows no blood      Assessment & Plan:  Posterior thoracic pain, etiology unknown.  Plan begin workup with chest x-ray today

## 2011-12-21 ENCOUNTER — Encounter: Payer: Self-pay | Admitting: Family Medicine

## 2011-12-21 ENCOUNTER — Ambulatory Visit (INDEPENDENT_AMBULATORY_CARE_PROVIDER_SITE_OTHER): Payer: PRIVATE HEALTH INSURANCE | Admitting: Family Medicine

## 2011-12-21 DIAGNOSIS — J449 Chronic obstructive pulmonary disease, unspecified: Secondary | ICD-10-CM

## 2011-12-21 DIAGNOSIS — H6120 Impacted cerumen, unspecified ear: Secondary | ICD-10-CM

## 2011-12-21 MED ORDER — HYDROCODONE-HOMATROPINE 5-1.5 MG/5ML PO SYRP: ORAL_SOLUTION | ORAL | Status: DC

## 2011-12-21 MED ORDER — PREDNISONE 20 MG PO TABS: ORAL_TABLET | ORAL | Status: DC

## 2011-12-21 NOTE — Progress Notes (Signed)
  Subjective:    Patient ID: Lori [name], female    DOB: [birth year], 78 y.o.   MRN: [id number]  HPI Lori [name] is a 78 year old widowed female nonsmoker who comes in today for evaluation of 2 problems  She states for the past 6 months she's had a cold. Her symptoms are head congestion postnasal drip and cough. She's had a history of allergic rhinitis and asthma in the past. She's been taking over-the-counter medications to no avail.  She's had no fever earache sore throat or sputum production.  She also has a history of bilateral hearing loss. She currently wears hearing aids but she says don't work   Review of Systems    general cardiopulmonary ENT review of systems otherwise negative Objective:   Physical Exam  Well-developed well-nourished thin female in no acute distress HEENT negative neck was supple no adenopathy lungs show symmetrical breath sounds moderate to mild late expiratory wheezing bilaterally she also has bilateral cerumen impactions      Assessment & Plan:  Asthma plan prednisone burst and taper  Bilateral cerumen impactions irrigation

## 2011-12-21 NOTE — Patient Instructions (Signed)
Take the prednisone as directed return next Tuesday for followup  Hydromet 1/2-1 teaspoon 3 times daily as needed for cough  Drink lots of water  Run vaporizer or humidifier in her bedroom at night

## 2011-12-26 ENCOUNTER — Ambulatory Visit (INDEPENDENT_AMBULATORY_CARE_PROVIDER_SITE_OTHER): Payer: PRIVATE HEALTH INSURANCE | Admitting: Family Medicine

## 2011-12-26 ENCOUNTER — Encounter: Payer: Self-pay | Admitting: Family Medicine

## 2011-12-26 DIAGNOSIS — J449 Chronic obstructive pulmonary disease, unspecified: Secondary | ICD-10-CM

## 2011-12-26 DIAGNOSIS — J4489 Other specified chronic obstructive pulmonary disease: Secondary | ICD-10-CM

## 2011-12-26 NOTE — Patient Instructions (Signed)
Taper the prednisone as outlined return when necessary 

## 2011-12-26 NOTE — Progress Notes (Signed)
  Subjective:    Patient ID: Lori [name], female    DOB: 1920/07/16, 78 y.o.   MRN: [id number]  HPI Lori [name] is an 78 year old female who comes in today for followup of asthma  We started her on a short course of prednisone last week because her asthma flared up it flares up once or twice a year. She states she feels better no side effects to the medicine   Review of Systems    general and pulmonary review of systems otherwise negative Objective:   Physical Exam  Well-developed well-nourished female in no acute distress examination along shows decreased breath sounds per usual very mild late expiratory wheezing      Assessment & Plan:  Asthma resolving plan Taper prednisone as outlined return when necessary

## 2012-02-15 ENCOUNTER — Encounter: Payer: Self-pay | Admitting: Family Medicine

## 2012-02-26 ENCOUNTER — Ambulatory Visit (INDEPENDENT_AMBULATORY_CARE_PROVIDER_SITE_OTHER): Payer: PRIVATE HEALTH INSURANCE | Admitting: Family

## 2012-02-26 ENCOUNTER — Encounter: Payer: Self-pay | Admitting: Family

## 2012-02-26 VITALS — BP 134/86 | Temp 98.6°F | Wt 110.0 lb

## 2012-02-26 DIAGNOSIS — L739 Follicular disorder, unspecified: Secondary | ICD-10-CM

## 2012-02-26 DIAGNOSIS — N760 Acute vaginitis: Secondary | ICD-10-CM

## 2012-02-26 DIAGNOSIS — L738 Other specified follicular disorders: Secondary | ICD-10-CM

## 2012-02-26 MED ORDER — DOXYCYCLINE HYCLATE 100 MG PO TABS: 100.0000 mg | ORAL_TABLET | Freq: Two times a day (BID) | ORAL | Status: AC

## 2012-02-26 NOTE — Patient Instructions (Signed)

## 2012-02-26 NOTE — Progress Notes (Signed)
Subjective:    Patient ID: Lori [name], female    DOB: [birth year], 78 y.o.   MRN: [id number]  HPI Comments: 78 yo white female presents with c/o lesion to left labia that is painful to touch and urination causing the site to burn.  Does not understand STD and does not know if she has ever been exposed to STD. Denies fever, chills, or vaginal discharge.     Review of Systems  Constitutional: Negative.   Respiratory: Negative.   Cardiovascular: Negative.   Gastrointestinal: Negative.   Genitourinary: Positive for genital sores. Negative for dysuria, urgency, frequency, hematuria, flank pain, decreased urine volume, vaginal bleeding, vaginal discharge, enuresis, difficulty urinating, vaginal pain, menstrual problem, pelvic pain and dyspareunia.  Musculoskeletal: Negative.   Skin: Negative.    Past Medical History  Diagnosis Date  . Osteoporosis   . Mitral stenosis   . Migraine   . Hearing loss   . Pneumonia   . Cataract     bilateral    History   Social History  . Marital Status: Married    Spouse Name: N/A    Number of Children: N/A  . Years of Education: N/A   Occupational History  . Not on file.   Social History Main Topics  . Smoking status: Never Smoker   . Smokeless tobacco: Not on file  . Alcohol Use: No  . Drug Use: No  . Sexually Active:    Other Topics Concern  . Not on file   Social History Narrative  . No narrative on file    Past Surgical History  Procedure Date  . Appendectomy   . Abdominal hysterectomy   . Bilateral salpingoophorectomy   . Eye surgery     bilateral  . Tubal ligation     No family history on file.  No Known Allergies  Current Outpatient Prescriptions on File Prior to Visit  Medication Sig Dispense Refill  . aspirin 325 MG tablet Take 325 mg by mouth daily.        . calcium-vitamin D (OSCAL WITH D 500-200) 500-200 MG-UNIT per tablet        . hydrochlorothiazide 12.5 MG capsule Take 12.5 mg by mouth daily.         Marland Kitchen HYDROcodone-acetaminophen (VICODIN ES) 7.5-750 MG per tablet One half to one tablet every 4 to 6 hours as needed for severe back pain  30 tablet  2  . HYDROcodone-homatropine (HYCODAN) 5-1.5 MG/5ML syrup One half teaspoon 3 times daily as needed for cough  120 mL  1  . predniSONE (DELTASONE) 20 MG tablet 2 tabs x3 days, 1 tab x3 days, a half a tab x3 days, then a half a tablet Monday Wednesday Friday for a two-week taper  40 tablet  1  . zolmitriptan (ZOMIG) 2.5 MG tablet Take 2.5 mg by mouth as needed.          BP 134/86  Temp(Src) 98.6 F (37 C) (Oral)  Wt 110 lb (49.896 kg)chart     Objective:   Physical Exam  Constitutional: She is oriented to person, place, and time. She appears well-developed and well-nourished. No distress.  Cardiovascular: Normal rate, regular rhythm, normal heart sounds and intact distal pulses.  Exam reveals no gallop and no friction rub.   No murmur heard. Pulmonary/Chest: Effort normal and breath sounds normal. No respiratory distress. She has no wheezes. She has no rales. She exhibits no tenderness.  Abdominal: Soft. Bowel sounds are normal. She exhibits no distension  and no mass. There is no tenderness. There is no rebound and no guarding.  Genitourinary: No vaginal discharge found.  Neurological: She is alert and oriented to person, place, and time.  Skin: Skin is warm and dry. She is not diaphoretic.          AAssessment & Plan:  Assessment: Vaginal abcess Plan: Teaching handout on diagnosis and treatment provided. Encouraged to RTC if s/s get worse. Opportunity for questions provide. Doxycycline. Apply warm compress as needed for comfort.

## 2012-05-07 ENCOUNTER — Ambulatory Visit (INDEPENDENT_AMBULATORY_CARE_PROVIDER_SITE_OTHER): Payer: PRIVATE HEALTH INSURANCE | Admitting: Family Medicine

## 2012-05-07 ENCOUNTER — Encounter: Payer: Self-pay | Admitting: Family Medicine

## 2012-05-07 VITALS — BP 100/56 | Temp 98.0°F | Wt 108.0 lb

## 2012-05-07 DIAGNOSIS — R609 Edema, unspecified: Secondary | ICD-10-CM

## 2012-05-07 NOTE — Progress Notes (Signed)
  Subjective:    Patient ID: Lori [name], female    DOB: 11/15/1919, 78 y.o.   MRN: [id number]  HPI  Lori [name] is an 78 year old female who comes in today for evaluation of episodes of lightheadedness  She states when she stands up suddenly she feels lightheaded no syncope. She's taking hydrochlorothiazi         12.5 mg daily for fluid retention  BP today 100/56 pulse 70 and regular  Review of Systems    cardiovascular review of systems otherwise negative Objective:   Physical Exam Well-developed thin female weight 108,,,,,,,,,, no acute distress cardiopulmonary exam normal       Assessment & Plan:

## 2012-05-07 NOTE — Patient Instructions (Signed)
Stop the hydrochlorothiazide  Check your blood pressure daily at home  Return in one month for followup

## 2012-06-10 ENCOUNTER — Ambulatory Visit: Payer: PRIVATE HEALTH INSURANCE | Admitting: Family Medicine

## 2012-06-13 ENCOUNTER — Encounter: Payer: Self-pay | Admitting: Family Medicine

## 2012-06-13 ENCOUNTER — Ambulatory Visit (INDEPENDENT_AMBULATORY_CARE_PROVIDER_SITE_OTHER): Payer: PRIVATE HEALTH INSURANCE | Admitting: Family Medicine

## 2012-06-13 VITALS — BP 148/80 | Temp 98.3°F | Wt 106.0 lb

## 2012-06-13 DIAGNOSIS — R42 Dizziness and giddiness: Secondary | ICD-10-CM

## 2012-06-13 DIAGNOSIS — I10 Essential (primary) hypertension: Secondary | ICD-10-CM | POA: Insufficient documentation

## 2012-06-13 NOTE — Progress Notes (Signed)
  Subjective:    Patient ID: Lori [name], female    DOB: April 25, 1920, 78 y.o.   MRN: [id number]  HPI Lori [name] is a 78 year old female who comes in today for followup of hypertension  We started her on HCTZ 12.5 mg daily BP now in the 140 range systolic 80 diastolic.  She also has significant bilateral hearing loss and occasional vertigo. The vertigo gets bad she has to go to bed rest.  She still works part-time at the [employer]   Review of [employer] and cardiovascular review of systems otherwise negative    Objective:   Physical Exam Well-developed well-nourished female no acute distress BP today here 148/80       Assessment & Plan:  Hypertension at goal continue current therapy followup when necessary

## 2012-06-13 NOTE — Patient Instructions (Signed)
Continue current medications  Followup in 1 year sooner if any problems 

## 2012-11-04 ENCOUNTER — Ambulatory Visit (INDEPENDENT_AMBULATORY_CARE_PROVIDER_SITE_OTHER): Payer: PRIVATE HEALTH INSURANCE | Admitting: Family Medicine

## 2012-11-04 ENCOUNTER — Encounter: Payer: Self-pay | Admitting: Family Medicine

## 2012-11-04 VITALS — BP 124/60 | Temp 98.4°F | Wt 106.0 lb

## 2012-11-04 DIAGNOSIS — G609 Hereditary and idiopathic neuropathy, unspecified: Secondary | ICD-10-CM

## 2012-11-04 DIAGNOSIS — G629 Polyneuropathy, unspecified: Secondary | ICD-10-CM

## 2012-11-04 MED ORDER — GABAPENTIN 100 MG PO CAPS: 100.0000 mg | ORAL_CAPSULE | Freq: Every day | ORAL | Status: DC

## 2012-11-04 NOTE — Progress Notes (Signed)
  Subjective:    Patient ID: Lori [name], female    DOB: [birth year], 78 y.o.   MRN: [id number]  HPI Acute visit. Patient complains of a sharp pain in her toes mostly second third and fourth toes left foot lesser extent right foot past couple weeks. No back pain. Pain is 8/10 severity in somewhat intermittent but becoming more constant. She has pain at rest and especially at night. Denies any numbness. No weakness. No recent change of shoes. No claudication symptoms. Nonsmoker. No history of diabetes. No alleviating factors. Took a couple Tylenol once without improvement. No exacerbating factors. No hx of PVD.    Past Medical History  Diagnosis Date  . Osteoporosis   . Mitral stenosis   . Migraine   . Hearing loss   . Pneumonia   . Cataract     bilateral   Past Surgical History  Procedure Date  . Appendectomy   . Abdominal hysterectomy   . Bilateral salpingoophorectomy   . Eye surgery     bilateral  . Tubal ligation     reports that she has never smoked. She does not have any smokeless tobacco history on file. She reports that she does not drink alcohol or use illicit drugs. family history is not on file. No Known Allergies    Review of Systems  Constitutional: Negative for fever, chills, appetite change and unexpected weight change.  Respiratory: Negative for cough and shortness of breath.   Cardiovascular: Negative for chest pain.  Gastrointestinal: Negative for abdominal pain.  Musculoskeletal: Negative for back pain.  Skin: Negative for rash.  Neurological: Negative for weakness and numbness.  Hematological: Negative for adenopathy.       Objective:   Physical Exam  Constitutional: She appears well-developed and well-nourished.  Cardiovascular: Normal rate and regular rhythm.   Pulmonary/Chest: Effort normal and breath sounds normal. No respiratory distress. She has no wheezes. She has no rales.  Musculoskeletal: She exhibits no edema.       Feet reveal 2 +  dorsalis pedis pulses bilaterally. Good capillary refill. She has a callus left second toe dorsally which is essentially nontender. No specific areas of bony tenderness.  Neurological:       Deep tender reflexes are 2+ knee and trace ankle bilaterally. Full-strength with plantar flexion dorsiflexion bilaterally          Assessment & Plan:   sharp pain left foot greater than right. This sounds by history more neuropathic. Doubt metabolic. Check B12, basic metabolic panel, TSH. If all normal consider trial of low-dose gabapentin 100 mg each bedtime. Follow up with primary in 2 weeks to reassess

## 2012-11-04 NOTE — Patient Instructions (Addendum)
Take Gabapentin at night  Schedule follow up with Dr Tawanna Cooler in 2 weeks

## 2012-11-05 LAB — VITAMIN B12: Vitamin B-12: 181 pg/mL — ABNORMAL LOW (ref 211–911)

## 2012-11-05 LAB — BASIC METABOLIC PANEL
CO2: 28 mEq/L (ref 19–32)
Chloride: 102 mEq/L (ref 96–112)
Potassium: 3.7 mEq/L (ref 3.5–5.1)

## 2012-11-05 LAB — TSH: TSH: 0.44 u[IU]/mL (ref 0.35–5.50)

## 2012-11-07 NOTE — Progress Notes (Signed)
Quick Note:  Pt informed ______ 

## 2012-11-11 ENCOUNTER — Ambulatory Visit (INDEPENDENT_AMBULATORY_CARE_PROVIDER_SITE_OTHER): Payer: PRIVATE HEALTH INSURANCE | Admitting: *Deleted

## 2012-11-11 DIAGNOSIS — E538 Deficiency of other specified B group vitamins: Secondary | ICD-10-CM

## 2012-11-11 MED ORDER — CYANOCOBALAMIN 1000 MCG/ML IJ SOLN
1000.0000 ug | Freq: Once | INTRAMUSCULAR | Status: AC
  Administered 2012-11-11: 1000 ug via INTRAMUSCULAR

## 2012-11-18 ENCOUNTER — Encounter: Payer: Self-pay | Admitting: Family Medicine

## 2012-11-18 ENCOUNTER — Ambulatory Visit (INDEPENDENT_AMBULATORY_CARE_PROVIDER_SITE_OTHER): Payer: PRIVATE HEALTH INSURANCE | Admitting: Family Medicine

## 2012-11-18 VITALS — BP 110/70 | Temp 98.1°F | Wt 106.0 lb

## 2012-11-18 DIAGNOSIS — D510 Vitamin B12 deficiency anemia due to intrinsic factor deficiency: Secondary | ICD-10-CM

## 2012-11-18 DIAGNOSIS — G629 Polyneuropathy, unspecified: Secondary | ICD-10-CM | POA: Insufficient documentation

## 2012-11-18 DIAGNOSIS — G589 Mononeuropathy, unspecified: Secondary | ICD-10-CM

## 2012-11-18 DIAGNOSIS — L84 Corns and callosities: Secondary | ICD-10-CM

## 2012-11-18 MED ORDER — CYANOCOBALAMIN 1000 MCG/ML IJ SOLN
1000.0000 ug | Freq: Once | INTRAMUSCULAR | Status: AC
  Administered 2012-11-18: 1000 ug via INTRAMUSCULAR

## 2012-11-18 NOTE — Patient Instructions (Signed)
Apply moleskin to the corn daily  Continue your B12 shots 1 weekly for 4 weeks then one monthly  Continue the Neurontin one tablet at bedtime  Return in 2 months for followup with me sooner if any problems

## 2012-11-18 NOTE — Progress Notes (Signed)
  Subjective:    Patient ID: Lori [name], female    DOB: Dec 13, 1919, 79 y.o.   MRN: [id number]  HPI Merle is a 79 year old widowed female x2 who comes in today for evaluation of a neuropathy and a corn  She was seen 2 weeks ago by Dr. Sharman Cheek with pain in her feet. Her thyroid level was normal as was her metabolic panel however her B12 level is low at 181. She came in and got her first B12 shot last week. She declines trying to give herself shots at home.  She was also given Neurontin 100 mg today at bedtime because of the neuropathy secondary to low B12 level she says that's helped somewhat  She also has a corn on the dorsal right second toe.   Review of Systems Review of systems otherwise negative    Objective:   Physical Exam Well-developed and female no acute distress examination of foot shows a corn dorsum right second toe. The area was prepped with alcohol sterile blade was used to excise the corn partially       Assessment & Plan:  B12 deficiency plan B12 shots weekly for 4 weeks and one monthly  : Dorsum right foot,,,,,,,,,,,, moleskin every morning return when necessary  Neuropathy continue Neurontin 1 daily

## 2012-11-22 ENCOUNTER — Encounter: Payer: Self-pay | Admitting: Internal Medicine

## 2012-11-22 ENCOUNTER — Ambulatory Visit (INDEPENDENT_AMBULATORY_CARE_PROVIDER_SITE_OTHER): Payer: Medicare Other | Admitting: Internal Medicine

## 2012-11-22 ENCOUNTER — Other Ambulatory Visit: Payer: Medicare Other

## 2012-11-22 ENCOUNTER — Ambulatory Visit (INDEPENDENT_AMBULATORY_CARE_PROVIDER_SITE_OTHER)
Admission: RE | Admit: 2012-11-22 | Discharge: 2012-11-22 | Disposition: A | Discharge status: Home / Self Care | Payer: Medicare Other | Source: Ambulatory Visit | Attending: Internal Medicine | Admitting: Internal Medicine

## 2012-11-22 VITALS — BP 112/70 | HR 72 | Ht 61.0 in | Wt 106.4 lb

## 2012-11-22 DIAGNOSIS — J310 Chronic rhinitis: Secondary | ICD-10-CM

## 2012-11-22 DIAGNOSIS — J449 Chronic obstructive pulmonary disease, unspecified: Secondary | ICD-10-CM

## 2012-11-22 DIAGNOSIS — R05 Cough: Secondary | ICD-10-CM

## 2012-11-22 NOTE — Assessment & Plan Note (Signed)
Not a strong allergy history or recognized triggers. Legrand Rams this being vasomotor/ irritant rhinitis. She does comment it is annoying. Plan- Allergy profile. Trial of Dymista nasal spray.

## 2012-11-22 NOTE — Assessment & Plan Note (Signed)
Cough with swallowing suggests LPR. She denies reflux. Remote smoking hx. Plan- CXR. Consider MBS in future. Watch response to treatment for PND

## 2012-11-22 NOTE — Progress Notes (Signed)
11/22/12- [name] former smoker-self referral-runny nose and dry cough x 3 years-unsure cause ? allergies   Dr Tawanna Cooler PCP Occasional dry cough first thing in the morning and especially while eating. Denies getting strangled, or any sense of reflux/ heart burn. Quit smoking remotely and denies hx of lung disease. Told once in past that she had some asthma. No wheezing in recent years. No TB exposure. Annoying watery runny nose as she gets up each morning. No hx of significant seasonal rhinitis. Not affected by weather, environment or exposure. No self- treatment.  She was in the [employer] for 2 years in [employer]. Daughter works in this office.   Prior to Admission medications   Medication Sig Start Date End Date Taking? Authorizing Provider  aspirin 325 MG tablet Take 325 mg by mouth daily.     Yes Historical Provider, MD  calcium-vitamin D (OSCAL WITH D 500-200) 500-200 MG-UNIT per tablet     Yes Historical Provider, MD  Cyanocobalamin (VITAMIN B-12 IJ) Inject 1,000 mcg as directed once a week.   Yes Historical Provider, MD  desonide (DESOWEN) 0.05 % lotion  09/19/12  Yes Historical Provider, MD  gabapentin (NEURONTIN) 100 MG capsule Take 1 capsule (100 mg total) by mouth at bedtime. 11/04/12  Yes Kristian Covey, MD  hydrochlorothiazide 12.5 MG capsule Take 12.5 mg by mouth daily.     Yes Historical Provider, MD  HYDROcodone-acetaminophen (VICODIN ES) 7.5-750 MG per tablet One half to one tablet every 4 to 6 hours as needed for severe back pain 05/25/11  Yes Roderick Pee, MD   Prior to Admission medications   Medication Sig Start Date End Date Taking? Authorizing Provider  aspirin 325 MG tablet Take 325 mg by mouth daily.     Yes Historical Provider, MD  calcium-vitamin D (OSCAL WITH D 500-200) 500-200 MG-UNIT per tablet     Yes Historical Provider, MD  Cyanocobalamin (VITAMIN B-12 IJ) Inject 1,000 mcg as directed once a week.   Yes Historical Provider, MD  desonide (DESOWEN) 0.05 % lotion  09/19/12   Yes Historical Provider, MD  gabapentin (NEURONTIN) 100 MG capsule Take 1 capsule (100 mg total) by mouth at bedtime. 11/04/12  Yes Kristian Covey, MD  hydrochlorothiazide 12.5 MG capsule Take 12.5 mg by mouth daily.     Yes Historical Provider, MD  HYDROcodone-acetaminophen (VICODIN ES) 7.5-750 MG per tablet One half to one tablet every 4 to 6 hours as needed for severe back pain 05/25/11  Yes Roderick Pee, MD   Past Medical History  Diagnosis Date  . Osteoporosis   . Mitral stenosis   . Migraine   . Hearing loss   . Pneumonia   . Cataract     bilateral   Past Surgical History  Procedure Date  . Appendectomy   . Abdominal hysterectomy   . Bilateral salpingoophorectomy   . Eye surgery     bilateral  . Tubal ligation    History reviewed. No pertinent family history. History   Social History  . Marital Status: Married    Spouse Name: N/A    Number of Children: N/A  . Years of Education: N/A   Occupational History  . Retired    Social History Main Topics  . Smoking status: Former Smoker -- 0.5 packs/day for 24 years    Types: Cigarettes    Quit date: 11/13/1965  . Smokeless tobacco: Not on file  . Alcohol Use: No  . Drug Use: No  . Sexually  Active: Not on file   Other Topics Concern  . Not on file   Social History Narrative  . No narrative on file   ROS-see HPI Constitutional:   No-   weight loss, night sweats, fevers, chills, fatigue, lassitude. HEENT:   No-  headaches, difficulty swallowing, tooth/dental problems, sore throat,       No-  sneezing, itching, ear ache, nasal congestion, +post nasal drip,  CV:  No-   chest pain, orthopnea, PND, swelling in lower extremities, anasarca,                                  dizziness, palpitations Resp: No-   shortness of breath with exertion or at rest.              +   productive cough,  + non-productive cough,  No- coughing up of blood.              No-   change in color of mucus.  No- wheezing.   Skin: No-    rash or lesions. GI:  No-   heartburn, indigestion, abdominal pain, nausea, vomiting, diarrhea,                 change in bowel habits, loss of appetite GU: No-   dysuria, change in color of urine, no urgency or frequency.  No- flank pain. MS:  No- acute  joint pain or swelling.  No- decreased range of motion.  No- back pain. Neuro-     nothing unusual Psych:  No- change in mood or affect. No depression or anxiety.  No memory loss.  OBJ- Physical Exam General- Alert, Oriented, Affect-appropriate, Distress- none acute Skin- rash-none, lesions- none, excoriation- none Lymphadenopathy- none Head- atraumatic            Eyes- Gross vision intact, PERRLA, conjunctivae and secretions clear            Ears- Hearing, canals-normal            Nose- Clear, no-Septal dev, mucus, polyps, erosion, perforation             Throat- Mallampati II , mucosa clear , drainage- none, tonsils- atrophic Neck- flexible , trachea midline, no stridor , thyroid nl, carotid no bruit Chest - symmetrical excursion , unlabored           Heart/CV- RRR , no murmur , no gallop  , no rub, nl s1 s2                           - JVD- none , edema- none, stasis changes- none, varices- none           Lung- clear, but light cough with deep breath. unlabored , dullness-none, rub- none           Chest wall-  Abd- tender-no, distended-no, bowel sounds-present, HSM- no Br/ Gen/ Rectal- Not done, not indicated Extrem- cyanosis- none, clubbing, none, atrophy- none, strength- nl Neuro- grossly intact to observation

## 2012-11-22 NOTE — Patient Instructions (Addendum)
Order- lab- Allergy profile    Dx rhinitis              CXR- dx cough   Sample Dymista nasal spray      1 or 2 puffs in each nostril once every day at  bedtime

## 2012-11-25 ENCOUNTER — Ambulatory Visit (INDEPENDENT_AMBULATORY_CARE_PROVIDER_SITE_OTHER): Payer: Medicare Other | Admitting: *Deleted

## 2012-11-25 DIAGNOSIS — E538 Deficiency of other specified B group vitamins: Secondary | ICD-10-CM

## 2012-11-25 LAB — ALLERGY FULL PROFILE
Allergen, D pternoyssinus,d7: <0.1 kU/L
Allergen,Goose feathers, e70: <0.1 kU/L
Alternaria Alternata: <0.1 kU/L
Aspergillus fumigatus, IgG: <0.1 kU/L
Bahia Grass: <0.1 kU/L
Cat Dander: <0.1 kU/L
Common Ragweed: <0.1 kU/L
Dog Dander: <0.1 kU/L
Goldenrod: <0.1 kU/L
Helminthosporium halodes: <0.1 kU/L
House Dust Hollister: <0.1 kU/L
Lamb's Quarters: <0.1 kU/L
Plantain: <0.1 kU/L
Sycamore Tree: <0.1 kU/L

## 2012-11-25 MED ORDER — CYANOCOBALAMIN 1000 MCG/ML IJ SOLN
1000.0000 ug | Freq: Once | INTRAMUSCULAR | Status: AC
  Administered 2012-11-25: 1000 ug via INTRAMUSCULAR

## 2012-11-27 ENCOUNTER — Encounter: Payer: Self-pay | Admitting: Internal Medicine

## 2012-11-27 ENCOUNTER — Encounter: Payer: Self-pay | Admitting: *Deleted

## 2012-11-27 NOTE — Progress Notes (Signed)
Quick Note:        Results sent via mychart

## 2012-11-27 NOTE — Telephone Encounter (Signed)
This has been handled via my chart message.

## 2012-11-27 NOTE — Telephone Encounter (Signed)
Pt's daughter received results via MYCHART.  Noted by triage.  Will sign off per protocol.

## 2012-12-02 ENCOUNTER — Ambulatory Visit: Payer: PRIVATE HEALTH INSURANCE | Admitting: *Deleted

## 2012-12-03 ENCOUNTER — Ambulatory Visit (INDEPENDENT_AMBULATORY_CARE_PROVIDER_SITE_OTHER): Payer: Medicare Other | Admitting: *Deleted

## 2012-12-03 DIAGNOSIS — E538 Deficiency of other specified B group vitamins: Secondary | ICD-10-CM

## 2012-12-03 MED ORDER — CYANOCOBALAMIN 1000 MCG/ML IJ SOLN
1000.0000 ug | Freq: Once | INTRAMUSCULAR | Status: AC
  Administered 2012-12-03: 1000 ug via INTRAMUSCULAR

## 2012-12-07 ENCOUNTER — Encounter: Payer: Self-pay | Admitting: Family Medicine

## 2012-12-13 ENCOUNTER — Telehealth: Payer: Self-pay | Admitting: Internal Medicine

## 2012-12-13 MED ORDER — AZELASTINE-FLUTICASONE 137-50 MCG/ACT NA SUSP: 1.0000 | Freq: Every day | NASAL | Status: DC

## 2012-12-13 NOTE — Telephone Encounter (Signed)
Please advise if okay to send in RX for dymista? Also pt insurance will not cover name brand so is it okay to send the 2 separate medications? Thanks Dr. Maple Hudson Last OV 11/22/12 Pending OV 12/23/12

## 2012-12-13 NOTE — Telephone Encounter (Signed)
Spoke with Lori [name] and she is aware that ok per [facility] to send in the rx for the dymista.  They are not sure if the insurance will cover this medication but they will let us know if it will not.  Lori [name] will let the pt know that the rx has been sent in.  Nothing further is needed.

## 2012-12-13 NOTE — Telephone Encounter (Signed)
Ok to script Dymista # 1, ref prn,    1-2 puffs each nostril once daily at bedtime  I won't do prior auth. If insurance won't cover, then we can script to continue flonase plus add new Rx for     Astelin # 1, ref prn, 1-2 puffs each nostril with both flonase and Astelin to be given together, once daily at bedtime.

## 2012-12-23 ENCOUNTER — Encounter: Payer: Self-pay | Admitting: Internal Medicine

## 2012-12-23 ENCOUNTER — Ambulatory Visit (INDEPENDENT_AMBULATORY_CARE_PROVIDER_SITE_OTHER): Payer: Medicare Other | Admitting: Internal Medicine

## 2012-12-23 VITALS — BP 112/64 | HR 70 | Ht 61.0 in | Wt 108.0 lb

## 2012-12-23 DIAGNOSIS — J310 Chronic rhinitis: Secondary | ICD-10-CM

## 2012-12-23 NOTE — Patient Instructions (Addendum)
Try increasing Dymista nasal spray 1-2 puffs up to twice daily if needed. See if that helps the feeling of postnasal drip.  Your chest x ray was clear and your allergy blood test was negative. That means the nasal drip is probably a response to irritant triggers including temperature changes, wind and humidity.  If the Dymista doesn't work well enough, there are a couple of other meds we can try.

## 2012-12-23 NOTE — Progress Notes (Signed)
11/22/12- [name] former smoker-self referral-runny nose and dry cough x 3 years-unsure cause ? allergies   Dr Tawanna Cooler PCP Occasional dry cough first thing in the morning and especially while eating. Denies getting strangled, or any sense of reflux/ heart burn. Quit smoking remotely and denies hx of lung disease. Told once in past that she had some asthma. No wheezing in recent years. No TB exposure. Annoying watery runny nose as she gets up each morning. No hx of significant seasonal rhinitis. Not affected by weather, environment or exposure. No self- treatment.  She was in the [employer] for 2 years in [employer]. Daughter works in this office.   12/23/12- [name] former smoker-self referral-runny nose and dry cough x 3 years-unsure cause ? allergies   Dr Tawanna Cooler PCP  Mother of Nicholos Johns in our office FOLLOWS FOR: review labs with patient, still having runny nose and cough since last viit. Still watery nose, using Dymista 1 puff once daily. Main complaints are postnasal drip and dry cough. She says the cough is only because of postnasal drip "going down the wrong way". Nonproductive cough without wheeze. She can eat and drink without cough for strangle. Does not wake at night coughing. Allergy Profile 11/22/2012-total IgE 36.6. No specific elevations. I explained this is against any significant allergy process as a basis for cough. CXR 11/27/12-IMPRESSION:  COPD without acute abnormality. No interval change.  Original Report Authenticated By: Janeece Riggers, M.D.   ROS-see HPI Constitutional:   No-   weight loss, night sweats, fevers, chills, fatigue, lassitude. HEENT:   No-  headaches, difficulty swallowing, tooth/dental problems, sore throat,       No-  sneezing, itching, ear ache, nasal congestion, +post nasal drip,  CV:  No-   chest pain, orthopnea, PND, swelling in lower extremities, anasarca,                                  dizziness, palpitations Resp: No-   shortness of breath with exertion or at rest.          No- productive cough,  + non-productive cough,  No- coughing up of blood.              No-   change in color of mucus.  No- wheezing.   Skin: No-   rash or lesions. GI:  No-   heartburn, indigestion, abdominal pain, nausea, vomiting,  GU: . MS:  No- acute  joint pain or swelling.   Neuro-     nothing unusual Psych:  No- change in mood or affect. No depression or anxiety.  No memory loss.  OBJ- Physical Exam General- Alert, Oriented, Affect-appropriate, Distress- none acute Skin- rash-none, lesions- none, excoriation- none Lymphadenopathy- none Head- atraumatic            Eyes- Gross vision intact, PERRLA, conjunctivae and secretions clear            Ears- Hearing, canals-normal            Nose-  no-Septal dev, +white mucus bridging, polyps, erosion, perforation             Throat- Mallampati II , mucosa clear , drainage- none, tonsils- atrophic Neck- flexible , trachea midline, no stridor , thyroid nl, carotid no bruit Chest - symmetrical excursion , unlabored           Heart/CV- RRR , no murmur , no gallop  , no rub, nl  s1 s2                           - JVD- none , edema- none, stasis changes- none, varices- none           Lung- clear, no-cough, no-wheeze, unlabored , dullness-none, rub- none           Chest wall-  Abd-  Br/ Gen/ Rectal- Not done, not indicated Extrem- cyanosis- none, clubbing, none, atrophy- none, strength- nl Neuro- grossly intact to observation

## 2012-12-29 NOTE — Assessment & Plan Note (Signed)
Nonallergic rhinitis probably vasomotor/GERD related. Postnasal drip syndrome. Plan-try increasing Dymista 2 puffs twice daily. In the future consider a trial of Astelin nasal spray or ipratropium.

## 2012-12-31 ENCOUNTER — Encounter (HOSPITAL_COMMUNITY): Payer: Self-pay | Admitting: Emergency Medicine

## 2012-12-31 ENCOUNTER — Emergency Department (HOSPITAL_COMMUNITY): Payer: Medicare Other

## 2012-12-31 ENCOUNTER — Telehealth: Payer: Self-pay | Admitting: Family Medicine

## 2012-12-31 ENCOUNTER — Emergency Department (HOSPITAL_COMMUNITY)
Admission: EM | Admit: 2012-12-31 | Discharge: 2013-01-01 | Disposition: A | Discharge status: Home / Self Care | Payer: Medicare Other | Attending: Emergency Medicine | Admitting: Emergency Medicine

## 2012-12-31 DIAGNOSIS — Z87891 Personal history of nicotine dependence: Secondary | ICD-10-CM | POA: Insufficient documentation

## 2012-12-31 DIAGNOSIS — H9190 Unspecified hearing loss, unspecified ear: Secondary | ICD-10-CM | POA: Insufficient documentation

## 2012-12-31 DIAGNOSIS — Z8701 Personal history of pneumonia (recurrent): Secondary | ICD-10-CM | POA: Insufficient documentation

## 2012-12-31 DIAGNOSIS — N390 Urinary tract infection, site not specified: Secondary | ICD-10-CM | POA: Insufficient documentation

## 2012-12-31 DIAGNOSIS — Z7982 Long term (current) use of aspirin: Secondary | ICD-10-CM | POA: Insufficient documentation

## 2012-12-31 DIAGNOSIS — Z8669 Personal history of other diseases of the nervous system and sense organs: Secondary | ICD-10-CM | POA: Insufficient documentation

## 2012-12-31 DIAGNOSIS — R443 Hallucinations, unspecified: Secondary | ICD-10-CM | POA: Insufficient documentation

## 2012-12-31 DIAGNOSIS — F29 Unspecified psychosis not due to a substance or known physiological condition: Secondary | ICD-10-CM | POA: Insufficient documentation

## 2012-12-31 DIAGNOSIS — Z79899 Other long term (current) drug therapy: Secondary | ICD-10-CM | POA: Insufficient documentation

## 2012-12-31 DIAGNOSIS — M810 Age-related osteoporosis without current pathological fracture: Secondary | ICD-10-CM | POA: Insufficient documentation

## 2012-12-31 DIAGNOSIS — H5316 Psychophysical visual disturbances: Secondary | ICD-10-CM | POA: Insufficient documentation

## 2012-12-31 DIAGNOSIS — Z8679 Personal history of other diseases of the circulatory system: Secondary | ICD-10-CM | POA: Insufficient documentation

## 2012-12-31 HISTORY — DX: Urinary tract infection, site not specified: N39.0

## 2012-12-31 LAB — URINALYSIS, ROUTINE W REFLEX MICROSCOPIC
Bilirubin Urine: NEGATIVE
Hgb urine dipstick: NEGATIVE
Ketones, ur: NEGATIVE mg/dL
Protein, ur: 30 mg/dL — AB
Specific Gravity, Urine: 1.016 (ref 1.005–1.030)
Urobilinogen, UA: 1 mg/dL (ref 0.0–1.0)

## 2012-12-31 LAB — CBC WITH DIFFERENTIAL/PLATELET
Eosinophils Absolute: 0.2 10*3/uL (ref 0.0–0.7)
Eosinophils Relative: 3 % (ref 0–5)
Lymphs Abs: 1 10*3/uL (ref 0.7–4.0)
MCH: 30.8 pg (ref 26.0–34.0)
MCV: 91.2 fL (ref 78.0–100.0)
Monocytes Relative: 11 % (ref 3–12)
Platelets: 256 10*3/uL (ref 150–400)
RBC: 3.86 MIL/uL — ABNORMAL LOW (ref 3.87–5.11)

## 2012-12-31 LAB — COMPREHENSIVE METABOLIC PANEL
BUN: 20 mg/dL (ref 6–23)
Calcium: 10.2 mg/dL (ref 8.4–10.5)
GFR calc Af Amer: 85 mL/min — ABNORMAL LOW (ref >90)
Glucose, Bld: 95 mg/dL (ref 70–99)
Sodium: 140 mEq/L (ref 135–145)
Total Protein: 7.2 g/dL (ref 6.0–8.3)

## 2012-12-31 LAB — URINE MICROSCOPIC-ADD ON

## 2012-12-31 MED ORDER — CEFUROXIME AXETIL 250 MG PO TABS: 250.0000 mg | ORAL_TABLET | Freq: Two times a day (BID) | ORAL | Status: DC

## 2012-12-31 MED ORDER — RISPERIDONE 0.5 MG PO TABS
0.2500 mg | ORAL_TABLET | Freq: Once | ORAL | Status: AC
  Administered 2013-01-01: 0.25 mg via ORAL
  Filled 2012-12-31: qty 1

## 2012-12-31 MED ORDER — RISPERIDONE 0.25 MG PO TABS: 0.2500 mg | ORAL_TABLET | Freq: Every evening | ORAL | Status: DC | PRN

## 2012-12-31 MED ORDER — CEFTRIAXONE SODIUM 1 G IJ SOLR
1.0000 g | Freq: Once | INTRAMUSCULAR | Status: AC
  Administered 2012-12-31: 1 g via INTRAMUSCULAR
  Filled 2012-12-31: qty 10

## 2012-12-31 MED ORDER — LIDOCAINE HCL (PF) 1 % IJ SOLN
INTRAMUSCULAR | Status: AC
  Administered 2012-12-31: 2 mL
  Filled 2012-12-31: qty 5

## 2012-12-31 NOTE — Telephone Encounter (Signed)
Patient Information:  Caller Name: Lori [name]  Phone: [phone]  Patient: Lori [name], Lori [name]  Gender: Female  DOB: 11-Nov-1920  Age: 79 Years  PCP: Kelle Darting [facility])  Office Follow Up:  Does the office need to follow up with this patient?: Yes  Instructions For The Office: Daughter Lori [name] wants to know if they can bring pt in today for lab work and follow up per scheduled appt.   Symptoms  Reason For Call & Symptoms: No changes in pt since this am. Daughter calling in regard to getting lab work done today.   She is requesting to bring pt in to start the lab work vs waiting until the appt. tomorrow.  Pt is mobile and she forsees no problems bringing pt in for lab work. Caller states EMS was at the house and checked pt out.  Pt refused to go to ED.   Reviewed Health History In EMR: Yes  Reviewed Medications In EMR: Yes  Reviewed Allergies In EMR: Yes  Reviewed Surgeries / Procedures: Yes  Date of Onset of Symptoms: 12/30/2012  Guideline(s) Used:  Confusion - Delirium  Disposition Per Guideline:   Call EMS 911 Now  Reason For Disposition Reached:   Difficult to awaken or acting confused (disoriented, slurred speech) and new onset  Advice Given:  N/A  Patient Refused Recommendation:  Patient Will Follow Up With Office Later  Did advise of 911/ED.

## 2012-12-31 NOTE — ED Notes (Addendum)
Patient complaining of visual and auditory hallucinations that started last night around 2130.  Denies suicidal and homocidal ideations.  EMS came out to house earlier; family states that they were worrisome of a stroke -- EMS denied seeing any symptoms of a stroke.  Patient has a history of frequent UTIs; denies history of stroke or TIA.  Denies trouble with gait.  Hand grips and foot pushes bilaterally equal and strong.  No facial droop present; no slurred speech.  Denies dizziness.  Patient alert and oriented x4.
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
## 2012-12-31 NOTE — Telephone Encounter (Signed)
ED Notification 

## 2012-12-31 NOTE — Telephone Encounter (Signed)
Per Dr Tawanna Cooler - Left message on machine for daughter that the patient should go to neurology or to psychiatry.  No labs or office visit needed at this time.

## 2012-12-31 NOTE — ED Provider Notes (Signed)
History     CSN: [id number]  Arrival date & time 12/31/12  1478   First MD Initiated Contact with Patient 12/31/12 2014      No chief complaint on file.   (Consider location/radiation/quality/duration/timing/severity/associated sxs/prior treatment) HPI Comments: Patient brought to the ER by family for evaluation of mental status changes. Family reports that around 9:30 last night she suddenly became confused. She has been having visual and auditory hallucinations. Patient was afraid to go to sleep last night because she did see people and cats in her house. Family reports that this is very unusual. She is normally quite sharp and oriented. They do, however, report that she has been showing some minor signs of confusion intermittently over the last month or so, but this is a drastic change today.   Past Medical History  Diagnosis Date  . Osteoporosis   . Mitral stenosis   . Migraine   . Hearing loss   . Pneumonia   . Cataract     bilateral  . UTI (urinary tract infection)     Past Surgical History  Procedure Laterality Date  . Appendectomy    . Abdominal hysterectomy    . Bilateral salpingoophorectomy    . Eye surgery      bilateral  . Tubal ligation      History reviewed. No pertinent family history.  History  Substance Use Topics  . Smoking status: Former Smoker -- 0.50 packs/day for 24 years    Types: Cigarettes    Quit date: 11/13/1965  . Smokeless tobacco: Not on file  . Alcohol Use: No    OB History   Grav Para Term Preterm Abortions TAB SAB Ect Mult Living                  Review of Systems  Psychiatric/Behavioral: Positive for hallucinations and confusion.  All other systems reviewed and are negative.    Allergies  Review of patient's allergies indicates no known allergies.  Home Medications   Current Outpatient Rx  Name  Route  Sig  Dispense  Refill  . aspirin 325 MG tablet   Oral   Take 325 mg by mouth daily.           .  Azelastine-Fluticasone (DYMISTA) 137-50 MCG/ACT SUSP   Nasal   Place 1-2 sprays into the nose at bedtime.         . calcium-vitamin D (OSCAL WITH D 500-200) 500-200 MG-UNIT per tablet   Oral   Take 1 tablet by mouth daily.          . Cyanocobalamin (VITAMIN B-12 IJ)   Injection   Inject 1,000 mcg as directed every 30 (thirty) days.          Marland Kitchen desonide (DESOWEN) 0.05 % lotion   Topical   Apply 1 application topically 2 (two) times daily as needed (to affected area for irritation).          . hydrochlorothiazide 12.5 MG capsule   Oral   Take 12.5 mg by mouth daily.            BP 146/58  Pulse 82  Temp(Src) 98.2 F (36.8 C) (Oral)  SpO2 100%  Physical Exam  Constitutional: She is oriented to person, place, and time. She appears well-developed and well-nourished. No distress.  HENT:  Head: Normocephalic and atraumatic.  Right Ear: Hearing normal.  Nose: Nose normal.  Mouth/Throat: Oropharynx is clear and moist and mucous membranes are normal.  Eyes: Conjunctivae and  EOM are normal. Pupils are equal, round, and reactive to light.  Neck: Normal range of motion. Neck supple.  Cardiovascular: Normal rate, regular rhythm, S1 normal and S2 normal.  Exam reveals no gallop and no friction rub.   No murmur heard. Pulmonary/Chest: Effort normal and breath sounds normal. No respiratory distress. She exhibits no tenderness.  Abdominal: Soft. Normal appearance and bowel sounds are normal. There is no hepatosplenomegaly. There is no tenderness. There is no rebound, no guarding, no tenderness at McBurney's point and negative Murphy's sign. No hernia.  Musculoskeletal: Normal range of motion.  Neurological: She is alert and oriented to person, place, and time. She has normal strength. No cranial nerve deficit or sensory deficit. Coordination normal. GCS eye subscore is 4. GCS verbal subscore is 5. GCS motor subscore is 6.  Skin: Skin is warm, dry and intact. No rash noted. No  cyanosis.  Psychiatric: She has a normal mood and affect. Her speech is normal and behavior is normal. Thought content normal.    ED Course  Procedures (including critical care time)  Labs Reviewed  URINALYSIS, ROUTINE W REFLEX MICROSCOPIC - Abnormal; Notable for the following:    APPearance TURBID (*)    Protein, ur 30 (*)    Nitrite POSITIVE (*)    All other components within normal limits  CBC WITH DIFFERENTIAL - Abnormal; Notable for the following:    RBC 3.86 (*)    Hemoglobin 11.9 (*)    HCT 35.2 (*)    All other components within normal limits  COMPREHENSIVE METABOLIC PANEL - Abnormal; Notable for the following:    Total Bilirubin 0.2 (*)    GFR calc non Af Amer 73 (*)    GFR calc Af Amer 85 (*)    All other components within normal limits  URINE MICROSCOPIC-ADD ON - Abnormal; Notable for the following:    Bacteria, UA MANY (*)    Crystals TRIPLE PHOSPHATE CRYSTALS (*)    All other components within normal limits   Ct Head Wo Contrast  12/31/2012  *RADIOLOGY REPORT*  Clinical Data: 79 year old female with hallucinations.  Frequent headaches.  CT HEAD WITHOUT CONTRAST  Technique:  Contiguous axial images were obtained from the base of the skull through the vertex without contrast.  Comparison: 10/31/2008.  Findings: Visualized paranasal sinuses and mastoids are clear.  No acute osseous abnormality identified.  No acute orbit or scalp soft tissue findings.  Calcified atherosclerosis at the skull base.  Mild generalized cerebral volume loss since 2009.  No ventriculomegaly. No midline shift, mass effect, or evidence of mass lesion.  Patchy and confluent cerebral white matter hypodensity has not significantly changed. No evidence of cortically based acute infarction identified.  Chronic intracranial artery dolichoectasia, including of the left ICA terminus, appears stable. No suspicious intracranial vascular hyperdensity. No acute intracranial hemorrhage identified.  IMPRESSION: No  acute intracranial abnormality.   Original Report Authenticated By: Erskine Speed, M.D.      Diagnosis: UTI with delirium    MDM  Patient comes to the ER for evaluation of visual and auditory hallucinations with confusion. Medical workup reveals urinary tract infection which I believe explains her symptoms. The remainder of her workup was unremarkable. The family is willing to take her home and stay with her. They request something for sedation at night, because she has not been sleeping with the hallucinations. An EKG was performed and there is no QT prolongation. Patient will be given risperdal for two nights.  Gilda Crease, MD 12/31/12 [phone]

## 2012-12-31 NOTE — Telephone Encounter (Signed)
Please Advise

## 2013-01-01 ENCOUNTER — Encounter: Payer: Self-pay | Admitting: Family Medicine

## 2013-01-01 ENCOUNTER — Ambulatory Visit: Payer: Medicare Other | Admitting: Family Medicine

## 2013-01-03 ENCOUNTER — Encounter (HOSPITAL_COMMUNITY): Payer: Self-pay

## 2013-01-03 ENCOUNTER — Telehealth: Payer: Self-pay | Admitting: Family Medicine

## 2013-01-03 ENCOUNTER — Emergency Department (INDEPENDENT_AMBULATORY_CARE_PROVIDER_SITE_OTHER)
Admission: EM | Admit: 2013-01-03 | Discharge: 2013-01-03 | Disposition: A | Discharge status: Home / Self Care | Payer: Medicare Other | Source: Home / Self Care | Attending: Emergency Medicine | Admitting: Emergency Medicine

## 2013-01-03 DIAGNOSIS — N390 Urinary tract infection, site not specified: Secondary | ICD-10-CM

## 2013-01-03 LAB — POCT URINALYSIS DIP (DEVICE)
Protein, ur: NEGATIVE mg/dL
Urobilinogen, UA: 0.2 mg/dL (ref 0.0–1.0)

## 2013-01-03 LAB — URINALYSIS, DIPSTICK ONLY
Ketones, ur: NEGATIVE mg/dL
Nitrite: POSITIVE — AB
Protein, ur: NEGATIVE mg/dL

## 2013-01-03 NOTE — ED Notes (Signed)
In ED 2-18 for syx of hallucinations, prior UTI multiple visits ; family thinks she may have been under treated, as she is having recurrent syx

## 2013-01-03 NOTE — Telephone Encounter (Signed)
Referral request sent 

## 2013-01-03 NOTE — Telephone Encounter (Signed)
Pt's daughter would like referral for neurologist.  Regarding pt's recent change in mental status.

## 2013-01-03 NOTE — ED Provider Notes (Addendum)
History     CSN: [id number]  Arrival date & time 01/03/13  1535   First MD Initiated Contact with Patient 01/03/13 1550      Chief Complaint  Patient presents with  . Urinary Tract Infection    (Consider location/radiation/quality/duration/timing/severity/associated sxs/prior treatment) HPI Comments: Daughter and relatives bring Lori [name] in to be checked tonight urgent care as she was seen in the emergency department 3 days ago for mental status changes that were thought to be related to her most recent urinary tract infection. Patient was started on Ceftin which she has been taking twice a day for the last 3 days. No new symptoms since discharge as an no fevers, no flank pain no abdominal pain no nausea vomiting or diarrheas. They have called their primary care Dr. inquiring about a consult with a neurologist as she is showing some " changes, as in looking a bit more confused than usual".   They are also inquiring if this antibiotic is working as they are unaware if a urine culture was sent 3 days ago in the emergency department  Lori [name] is aware she is in the hospital. To be checked and is tolerating her antibiotics well. Denies any lower abdominal pain or any new discomforts. Remains without fevers  Patient is a 79 y.o. female presenting with urinary tract infection. The history is provided by the patient.  Urinary Tract Infection This is a recurrent problem. The problem has not changed since onset.Pertinent negatives include no abdominal pain. Nothing aggravates the symptoms. Nothing relieves the symptoms. She has tried nothing for the symptoms. The treatment provided no relief.    Past Medical History  Diagnosis Date  . Osteoporosis   . Mitral stenosis   . Migraine   . Hearing loss   . Pneumonia   . Cataract     bilateral  . UTI (urinary tract infection)     Past Surgical History  Procedure Laterality Date  . Appendectomy    . Abdominal hysterectomy    .  Bilateral salpingoophorectomy    . Eye surgery      bilateral  . Tubal ligation      History reviewed. No pertinent family history.  History  Substance Use Topics  . Smoking status: Former Smoker -- 0.50 packs/day for 24 years    Types: Cigarettes    Quit date: 11/13/1965  . Smokeless tobacco: Not on file  . Alcohol Use: No    OB History   Grav Para Term Preterm Abortions TAB SAB Ect Mult Living                  Review of Systems  Constitutional: Negative for fever, activity change, appetite change and fatigue.  Gastrointestinal: Negative for nausea, vomiting, abdominal pain and diarrhea.  Neurological: Negative for dizziness.  Psychiatric/Behavioral: Positive for confusion. Negative for behavioral problems, dysphoric mood and agitation. The patient is not nervous/anxious and is not hyperactive.     Allergies  Review of patient's allergies indicates no known allergies.  Home Medications   Current Outpatient Rx  Name  Route  Sig  Dispense  Refill  . aspirin 325 MG tablet   Oral   Take 325 mg by mouth daily.           . Azelastine-Fluticasone (DYMISTA) 137-50 MCG/ACT SUSP   Nasal   Place 1-2 sprays into the nose at bedtime.         . calcium-vitamin D (OSCAL WITH D 500-200) 500-200 MG-UNIT per tablet  Oral   Take 1 tablet by mouth daily.          . cefUROXime (CEFTIN) 250 MG tablet   Oral   Take 1 tablet (250 mg total) by mouth 2 (two) times daily.   20 tablet   0   . Cyanocobalamin (VITAMIN B-12 IJ)   Injection   Inject 1,000 mcg as directed every 30 (thirty) days.          Marland Kitchen desonide (DESOWEN) 0.05 % lotion   Topical   Apply 1 application topically 2 (two) times daily as needed (to affected area for irritation).          . hydrochlorothiazide 12.5 MG capsule   Oral   Take 12.5 mg by mouth daily.          . risperiDONE (RISPERDAL) 0.25 MG tablet   Oral   Take 1-2 tablets (0.25-0.5 mg total) by mouth at bedtime as needed (agitation,  insomnia).   6 tablet   0     BP 125/66  Pulse 68  Temp(Src) 98 F (36.7 C) (Oral)  Resp 20  SpO2 96%  Physical Exam  Vitals reviewed. Constitutional: Vital signs are normal. She is active.  Non-toxic appearance. She does not have a sickly appearance. She does not appear ill. No distress.    Neurological: She is alert. She exhibits normal muscle tone. Coordination normal.  Skin: She is not diaphoretic.    ED Course  Procedures (including critical care time)  Labs Reviewed  POCT URINALYSIS DIP (DEVICE) - Abnormal; Notable for the following:    Hgb urine dipstick TRACE (*)    Nitrite POSITIVE (*)    Leukocytes, UA SMALL (*)    All other components within normal limits  URINE CULTURE  URINALYSIS, DIPSTICK ONLY   No results found.   1. Urinary tract infection       MDM  Recurrent urinary tract infections. Patient is afebrile with no new lead reported neurological symptoms since discharge from the emergency department 3 days ago. On chart review of his noticed that no urine culture was performed we have repeated a urine dip today, and order a culture. Patients relatives were instructed to continue with the use of Ceftin.  Relatives are concerned for recent deconditioning and have requested a neurology consult her primary care doctor. On today's visit urgent care it is felt that she has not developed any further or new symptoms. She looks comfortable. We have discussed what symptoms should warrant immediate evaluation in the emergency department. Both relatives and patient agreed   At this point patient is not reporting any visual auditory hallucinations.     Jimmie Molly, MD 01/03/13 1758  Jimmie Molly, MD 01/03/13 [phone]

## 2013-01-04 LAB — URINE CULTURE
Colony Count: NO GROWTH
Culture: NO GROWTH

## 2013-01-06 ENCOUNTER — Encounter: Payer: Self-pay | Admitting: Family Medicine

## 2013-01-08 ENCOUNTER — Encounter: Payer: Self-pay | Admitting: Family Medicine

## 2013-01-08 MED ORDER — CYANOCOBALAMIN 1000 MCG/ML IJ SOLN: 1000.0000 ug | INTRAMUSCULAR | Status: DC

## 2013-01-08 MED ORDER — "INSULIN SYRINGE 29G X 1/2"" 1 ML MISC": 1.0000 | Freq: Every day | Status: DC

## 2013-01-08 NOTE — Telephone Encounter (Signed)
Rx sent to pharmacy   

## 2013-01-13 ENCOUNTER — Encounter: Payer: Self-pay | Admitting: Family Medicine

## 2013-01-20 ENCOUNTER — Ambulatory Visit: Payer: PRIVATE HEALTH INSURANCE | Admitting: Family Medicine

## 2013-01-27 ENCOUNTER — Ambulatory Visit: Payer: Medicare Other | Admitting: Internal Medicine

## 2013-01-28 ENCOUNTER — Encounter: Payer: Self-pay | Admitting: Neurology

## 2013-01-29 ENCOUNTER — Ambulatory Visit: Payer: Self-pay | Admitting: Neurology

## 2013-01-30 ENCOUNTER — Encounter: Payer: Self-pay | Admitting: Family Medicine

## 2013-02-03 ENCOUNTER — Encounter: Payer: Self-pay | Admitting: Family Medicine

## 2013-02-05 ENCOUNTER — Ambulatory Visit (INDEPENDENT_AMBULATORY_CARE_PROVIDER_SITE_OTHER): Payer: Medicare Other

## 2013-02-05 DIAGNOSIS — D649 Anemia, unspecified: Secondary | ICD-10-CM

## 2013-02-06 MED ORDER — CYANOCOBALAMIN 1000 MCG/ML IJ SOLN
1000.0000 ug | Freq: Once | INTRAMUSCULAR | Status: AC
  Administered 2013-02-06: 1000 ug via INTRAMUSCULAR

## 2013-02-18 ENCOUNTER — Encounter: Payer: Self-pay | Admitting: Family Medicine

## 2013-02-18 ENCOUNTER — Ambulatory Visit (INDEPENDENT_AMBULATORY_CARE_PROVIDER_SITE_OTHER): Payer: Medicare Other | Admitting: Family Medicine

## 2013-02-18 VITALS — BP 130/80 | Temp 97.5°F | Wt 104.0 lb

## 2013-02-18 DIAGNOSIS — D510 Vitamin B12 deficiency anemia due to intrinsic factor deficiency: Secondary | ICD-10-CM

## 2013-02-18 DIAGNOSIS — G589 Mononeuropathy, unspecified: Secondary | ICD-10-CM

## 2013-02-18 DIAGNOSIS — R443 Hallucinations, unspecified: Secondary | ICD-10-CM

## 2013-02-18 DIAGNOSIS — G629 Polyneuropathy, unspecified: Secondary | ICD-10-CM

## 2013-02-18 LAB — VITAMIN B12: Vitamin B-12: 549 pg/mL (ref 211–911)

## 2013-02-18 LAB — TSH: TSH: 1.44 u[IU]/mL (ref 0.35–5.50)

## 2013-02-18 LAB — BASIC METABOLIC PANEL
Calcium: 10.1 mg/dL (ref 8.4–10.5)
GFR: 96 mL/min (ref >60.00)
Glucose, Bld: 89 mg/dL (ref 70–99)
Sodium: 142 mEq/L (ref 135–145)

## 2013-02-18 LAB — CBC WITH DIFFERENTIAL/PLATELET
Basophils Absolute: 0 10*3/uL (ref 0.0–0.1)
Eosinophils Absolute: 0.1 10*3/uL (ref 0.0–0.7)
Eosinophils Relative: 2.1 % (ref 0.0–5.0)
HCT: 38.1 % (ref 36.0–46.0)
Lymphs Abs: 0.9 10*3/uL (ref 0.7–4.0)
MCHC: 33.9 g/dL (ref 30.0–36.0)
MCV: 92.2 fl (ref 78.0–100.0)
Monocytes Absolute: 0.4 10*3/uL (ref 0.1–1.0)
Platelets: 238 10*3/uL (ref 150.0–400.0)
RDW: 13.1 % (ref 11.5–14.6)

## 2013-02-18 NOTE — Progress Notes (Signed)
  Subjective:    Patient ID: Lori [name], female    DOB: Apr 12, 1920, 79 y.o.   MRN: [id number]  HPI Lori [name] is a 79 year old widowed female who comes in today for followup of an emergency room visit  She was recently taken to the emergency room because of hallucinations. She had visual hallucinations also auditory hallucinations. In the emergency room her exam was normal labs were normal except for hemoglobin was down to 11. She states she's taking her B12 monthly. In the past she was given respired all 0.25 mg each bedtime when necessary by her neurologist at [facility] neurology.  Today accompanied by her son she states she feels well and has no complaints   Review of Systems    review of systems negative.......Marland Kitchen Review of emergency room record and lab work shows some abnormal findings in the urine however culture was negative Objective:   Physical Exam Well-developed and nourished female no acute distress BP right arm sitting position 130/80 examination eyes shows pale conjunctiva       Assessment & Plan:  Anemia recheck labs  Visual and auditory hallucinations followup with neurology ASAP f

## 2013-02-18 NOTE — Patient Instructions (Signed)
Stop the aspirin  Continue the B12 shots 1 monthly  Stop the diuretic  Stop the respiredal  Call and make an appointment to see you neurologist ASAP

## 2013-02-19 ENCOUNTER — Encounter: Payer: Self-pay | Admitting: Family Medicine

## 2013-03-04 ENCOUNTER — Ambulatory Visit (INDEPENDENT_AMBULATORY_CARE_PROVIDER_SITE_OTHER): Payer: Medicare Other | Admitting: Neurology

## 2013-03-04 ENCOUNTER — Encounter: Payer: Self-pay | Admitting: Neurology

## 2013-03-04 VITALS — BP 115/68 | HR 62 | Ht 60.0 in | Wt 105.0 lb

## 2013-03-04 DIAGNOSIS — R413 Other amnesia: Secondary | ICD-10-CM

## 2013-03-04 MED ORDER — MEMANTINE HCL ER 28 MG PO CP24: 28.0000 mg | ORAL_CAPSULE | Freq: Every day | ORAL | Status: DC

## 2013-03-04 MED ORDER — MEMANTINE HCL ER 7 & 14 & 21 &28 MG PO CP24: 1.0000 | ORAL_CAPSULE | Freq: Every day | ORAL | Status: DC

## 2013-03-04 NOTE — Progress Notes (Signed)
Lori [name] is a 79 years old right-handed Caucasian female, accompanied by her son, referred by her primary care physician for evaluation of memory loss, visual hallucination  She was previously healthy, living independently, had more than 12 years of education, has 6 children, retired as a Dipl[employer] Services operational officer, still working part-time job  Her son noticed that she has mild memory loss, she still lives independently, driving short distance, cooking, doing laundry all by herself,  since February 2014, she began to have episode of visual hallucination, she sees people in her room, talking, came in through the window, sometimes very bothersome for her.  She denies gait difficulty, no loss sense of smells.  Most recent laboratory evaluation showed normal CBC, CMP, TSH, she is on B12 IM supplement  Review of Systems  Out of a complete 14 system review, the patient complains of only the following symptoms, and all other reviewed systems are negative.   Constitutional:   N/A Cardiovascular:  N/A Ear/Nose/Throat:  Hearing loss Skin: N/A Eyes: blurred vision Respiratory: N/A Gastroitestinal: N/A    Hematology/Lymphatic:  N/A Endocrine:  N/A Musculoskeletal:N/A Allergy/Immunology: N/A Neurological: memory loss, confusion. Psychiatric:    N/A  PHYSICAL EXAMINATOINS:  Generalized: In no acute distress  Neck: Supple, no carotid bruits   Cardiac: Regular rate rhythm  Pulmonary: Clear to auscultation bilaterally  Musculoskeletal: No deformity  Neurological examination  Mentation: Alert oriented to time, place, history taking, and causual conversation, MMSE 27/30, she missed 3/3 recalls.  Cranial nerve II-XII: Pupils were equal round reactive to light extraocular movements were full, visual field were full on confrontational test. facial sensation and strength were normal. hearing was intact to finger rubbing bilaterally. Uvula tongue midline.  head turning and shoulder shrug and were normal and  symmetric.Tongue protrusion into cheek strength was normal.  Motor: normal tone, bulk and strength, kyphosis.  Sensory: Intact to fine touch, pinprick, preserved vibratory sensation, and proprioception at toes.  Coordination: Normal finger to nose, heel-to-shin bilaterally there was no truncal ataxia  Gait: Rising up from seated position without assistance, normal stance, without trunk ataxia, moderate stride, good arm swing, smooth turning, able to perform tiptoe, and heel walking without difficulty.   Romberg signs: Negative  Deep tendon reflexes: Brachioradialis 2/2, biceps 2/2, triceps 2/2, patellar 2+/2+, Achilles 2/2, plantar responses were extensor bilaterally.  Assessment and plan: 79 years old Caucasian female, presenting with two-month history of forgetfulness, visual hallucination, Mini-Mental Status Examination is 27 out of 30, most consistent with mild cognitive impairment  1. laboratory evaluation failed to demonstrate treatable etiology, she is on B12 supplement 2. most likely are central nervous system degenerative disorder, proceed with MRI of the brain 3. Return to clinic in 2-3 months

## 2013-03-06 ENCOUNTER — Encounter: Payer: Self-pay | Admitting: Family Medicine

## 2013-03-06 ENCOUNTER — Ambulatory Visit (INDEPENDENT_AMBULATORY_CARE_PROVIDER_SITE_OTHER): Payer: Medicare Other | Admitting: Family Medicine

## 2013-03-06 VITALS — BP 140/70 | Temp 98.4°F | Wt 106.0 lb

## 2013-03-06 DIAGNOSIS — H6121 Impacted cerumen, right ear: Secondary | ICD-10-CM

## 2013-03-06 DIAGNOSIS — R443 Hallucinations, unspecified: Secondary | ICD-10-CM

## 2013-03-06 DIAGNOSIS — N390 Urinary tract infection, site not specified: Secondary | ICD-10-CM

## 2013-03-06 DIAGNOSIS — H6120 Impacted cerumen, unspecified ear: Secondary | ICD-10-CM

## 2013-03-06 DIAGNOSIS — R413 Other amnesia: Secondary | ICD-10-CM

## 2013-03-06 LAB — POCT URINALYSIS DIPSTICK
Bilirubin, UA: NEGATIVE
Ketones, UA: NEGATIVE
pH, UA: 6

## 2013-03-06 MED ORDER — CIPROFLOXACIN HCL 500 MG PO TABS: ORAL_TABLET | ORAL | Status: DC

## 2013-03-06 NOTE — Progress Notes (Signed)
  Subjective:    Patient ID: Lori [name], female    DOB: Apr 25, 1920, 79 y.o.   MRN: [id number]  HPI Lori [name] is a 79 year old female who comes in today for evaluation of multiple issues  She's currently seeing neurology and is in the process of a workup for memory loss.  She has bilateral hearing Aids and feels that there is ear wax in her ears.  They're also concerned she may have a urinary tract infection although she has no urinary tract symptoms   Review of Systems Review of systems otherwise negative    Objective:   Physical Exam  Thin elderly female no acute distress examination of the ears shows a small amount of wax right ear left ear canal clear  Abdominal exam negative  Urine some abnormalities culture pending      Assessment & Plan:  Dementia with memory loss in the process of neurologic evaluation  Bilateral hearing loss cerumen impaction mild right relieved by irrigation  Question urinary tract infection culture pending

## 2013-03-06 NOTE — Patient Instructions (Addendum)
Drink 24 ounces of water daily  Cipro,,,,,,,,,,,, one daily  I will call you next week about 2 your urine culture

## 2013-03-07 ENCOUNTER — Encounter: Payer: Self-pay | Admitting: Family Medicine

## 2013-03-07 LAB — URINE CULTURE

## 2013-03-10 ENCOUNTER — Ambulatory Visit (INDEPENDENT_AMBULATORY_CARE_PROVIDER_SITE_OTHER): Payer: Medicare Other

## 2013-03-10 DIAGNOSIS — D649 Anemia, unspecified: Secondary | ICD-10-CM

## 2013-03-11 MED ORDER — CYANOCOBALAMIN 1000 MCG/ML IJ SOLN
1000.0000 ug | Freq: Once | INTRAMUSCULAR | Status: AC
  Administered 2013-03-11: 1000 ug via INTRAMUSCULAR

## 2013-03-12 ENCOUNTER — Ambulatory Visit
Admission: RE | Admit: 2013-03-12 | Discharge: 2013-03-12 | Disposition: A | Discharge status: Home / Self Care | Payer: Medicare Other | Source: Ambulatory Visit | Attending: Neurology | Admitting: Neurology

## 2013-03-12 DIAGNOSIS — R413 Other amnesia: Secondary | ICD-10-CM

## 2013-03-13 ENCOUNTER — Encounter: Payer: Self-pay | Admitting: Neurology

## 2013-03-13 ENCOUNTER — Encounter: Payer: Self-pay | Admitting: Family Medicine

## 2013-03-14 ENCOUNTER — Encounter: Payer: Self-pay | Admitting: Family Medicine

## 2013-03-14 NOTE — Progress Notes (Signed)
Quick Note:  Reviewed, chronic age related changes, no change in treatment plan. ______

## 2013-03-14 NOTE — Telephone Encounter (Signed)
Patient's daughter would like to know the results of her mom's MRI.  Daughter's name is Tivis Ringer [phone].

## 2013-03-17 ENCOUNTER — Telehealth: Payer: Self-pay | Admitting: *Deleted

## 2013-03-17 ENCOUNTER — Encounter: Payer: Self-pay | Admitting: Family Medicine

## 2013-03-17 ENCOUNTER — Other Ambulatory Visit: Payer: Self-pay | Admitting: *Deleted

## 2013-03-18 NOTE — Telephone Encounter (Signed)
Daughter would like to know what to expect from the results of the MRI?

## 2013-03-18 NOTE — Telephone Encounter (Signed)
Note sent to MyChart.

## 2013-03-19 ENCOUNTER — Telehealth: Payer: Self-pay | Admitting: Neurology

## 2013-03-19 NOTE — Telephone Encounter (Signed)
LM on patient's home phone.  Need to reschedule her 05/21/2013 appointment with Dr. Terrace Arabia

## 2013-04-16 ENCOUNTER — Other Ambulatory Visit (INDEPENDENT_AMBULATORY_CARE_PROVIDER_SITE_OTHER): Payer: Medicare Other | Admitting: Pulmonary Disease

## 2013-04-16 DIAGNOSIS — D649 Anemia, unspecified: Secondary | ICD-10-CM

## 2013-04-16 MED ORDER — CYANOCOBALAMIN 1000 MCG/ML IJ SOLN
1000.0000 ug | Freq: Once | INTRAMUSCULAR | Status: AC
  Administered 2013-04-16: 1000 ug via INTRAMUSCULAR

## 2013-04-18 ENCOUNTER — Encounter: Payer: Self-pay | Admitting: Family Medicine

## 2013-05-06 ENCOUNTER — Ambulatory Visit (INDEPENDENT_AMBULATORY_CARE_PROVIDER_SITE_OTHER): Payer: Medicare Other

## 2013-05-06 DIAGNOSIS — D510 Vitamin B12 deficiency anemia due to intrinsic factor deficiency: Secondary | ICD-10-CM

## 2013-05-07 ENCOUNTER — Telehealth: Payer: Self-pay | Admitting: Family Medicine

## 2013-05-07 NOTE — Telephone Encounter (Signed)
PT daughter called and stated that the pt's handicap sticker has ran out. She is requesting a new one be issues. Please assist.

## 2013-05-08 NOTE — Telephone Encounter (Signed)
Form ready to pick up.

## 2013-05-14 MED ORDER — CYANOCOBALAMIN 1000 MCG/ML IJ SOLN
1000.0000 ug | Freq: Once | INTRAMUSCULAR | Status: AC
  Administered 2013-05-14: 1000 ug via INTRAMUSCULAR

## 2013-05-21 ENCOUNTER — Encounter: Payer: Self-pay | Admitting: Neurology

## 2013-05-21 ENCOUNTER — Ambulatory Visit (INDEPENDENT_AMBULATORY_CARE_PROVIDER_SITE_OTHER): Payer: Medicare Other | Admitting: Neurology

## 2013-05-21 VITALS — BP 126/66 | HR 67 | Ht 60.0 in | Wt 105.0 lb

## 2013-05-21 DIAGNOSIS — R413 Other amnesia: Secondary | ICD-10-CM

## 2013-05-21 DIAGNOSIS — R109 Unspecified abdominal pain: Secondary | ICD-10-CM

## 2013-05-21 DIAGNOSIS — I10 Essential (primary) hypertension: Secondary | ICD-10-CM

## 2013-05-21 DIAGNOSIS — R443 Hallucinations, unspecified: Secondary | ICD-10-CM

## 2013-05-21 MED ORDER — DONEPEZIL HCL 10 MG PO TABS: 10.0000 mg | ORAL_TABLET | Freq: Every day | ORAL | Status: DC

## 2013-05-21 NOTE — Progress Notes (Signed)
Lori [name] is a 79 years old right-handed Caucasian female, referred by her primary care physician for evaluation of memory loss, visual hallucination  She was previously healthy, living independently, had more than 12 years of education, has 6 children, retired as a Diplomatic Services operational officer, still working part-time job, 2 days a week,.  She is a Education administrator.   Her son noticed that she has mild memory loss, she still lives independently, driving short distance, cooking, doing laundry all by herself,  since February 2014, she began to have episode of visual hallucination, she sees people in her room, talking, came in through the window, sometimes very bothersome for her.  She denies gait difficulty, no loss sense of smells.  Most recent laboratory evaluation showed normal CBC, CMP, TSH, she is on B12 IM supplement  UPDATE July 9th 2014:  We have reviewed MRI films together, which showed moderate atrophy, periventricular white matter disease, she has no significant change, still lives alone, is taking, and tolerating Namenda 28 mg every day, Will add on Aricept  10 mg every day   Review of Systems  Out of a complete 14 system review, the patient complains of only the following symptoms, and all other reviewed systems are negative.   Constitutional:   N/A Cardiovascular:  N/A Ear/Nose/Throat:  Hearing loss Skin: N/A Eyes: blurred vision Respiratory: N/A Gastroitestinal: N/A    Hematology/Lymphatic:  N/A Endocrine:  N/A Musculoskeletal:N/A Allergy/Immunology: N/A Neurological: memory loss, confusion. Psychiatric:    N/A  PHYSICAL EXAMINATOINS:  Generalized: In no acute distress  Neck: Supple, no carotid bruits   Cardiac: Regular rate rhythm  Pulmonary: Clear to auscultation bilaterally  Musculoskeletal: No deformity  Neurological examination  Mentation: Alert oriented to time, place, history taking, and causual conversation, MMSE 28/30, she missed 1/3 recalls, not oriented to  place.  Cranial nerve II-XII: Pupils were equal round reactive to light extraocular movements were full, visual field were full on confrontational test. facial sensation and strength were normal. hearing was intact to finger rubbing bilaterally. Uvula tongue midline.  head turning and shoulder shrug and were normal and symmetric.Tongue protrusion into cheek strength was normal.  Motor: normal tone, bulk and strength, kyphosis.  Sensory: Intact to fine touch, pinprick, preserved vibratory sensation, and proprioception at toes.  Coordination: Normal finger to nose, heel-to-shin bilaterally there was no truncal ataxia  Gait: Rising up from seated position without assistance, normal stance, without trunk ataxia, moderate stride, good arm swing, smooth turning, able to perform tiptoe, and heel walking without difficulty.   Romberg signs: Negative  Deep tendon reflexes: Brachioradialis 2/2, biceps 2/2, triceps 2/2, patellar 2+/2+, Achilles 2/2, plantar responses were extensor bilaterally.  Assessment and plan: 79 years old Caucasian female, presenting with two-month history of forgetfulness, visual hallucination, Mini-Mental Status Examination is 28 out of 30, most consistent with mild cognitive impairment  1. Add on aricept. 2. Moderate exercise 3. RTC in 6 months

## 2013-05-27 ENCOUNTER — Encounter: Payer: Self-pay | Admitting: Neurology

## 2013-05-27 NOTE — Telephone Encounter (Signed)
Error

## 2013-06-12 ENCOUNTER — Ambulatory Visit (INDEPENDENT_AMBULATORY_CARE_PROVIDER_SITE_OTHER): Payer: Medicare Other | Admitting: Internal Medicine

## 2013-06-12 ENCOUNTER — Encounter: Payer: Self-pay | Admitting: Internal Medicine

## 2013-06-12 VITALS — BP 130/70 | HR 75 | Temp 97.9°F | Resp 18 | Wt 106.0 lb

## 2013-06-12 DIAGNOSIS — I10 Essential (primary) hypertension: Secondary | ICD-10-CM

## 2013-06-12 DIAGNOSIS — H918X9 Other specified hearing loss, unspecified ear: Secondary | ICD-10-CM

## 2013-06-12 NOTE — Progress Notes (Signed)
Subjective:    Patient ID: Lori [name], female    DOB: Oct 29, 1920, 79 y.o.   MRN: [id number]  HPI 79 year old patient who is seen today for followup. She is unaccompanied. She has been seen by neurology for mild cognitive impairment with hallucinations. She is presently on both Aricept and Namenda. She is doing quite well. Complaints today include the hearing loss. She has required treatment for tremor impactions in the past. She does wear hearing aids. She has history of hypertension allergic rhinitis and mild asthma. She also complains of insomnia.  Past Medical History  Diagnosis Date  . Osteoporosis   . Mitral stenosis   . Migraine   . Hearing loss   . Pneumonia   . Cataract     bilateral  . UTI (urinary tract infection)   . Memory change     History   Social History  . Marital Status: Married    Spouse Name: N/A    Number of Children: N/A  . Years of Education: N/A   Occupational History  . Retired    Social History Main Topics  . Smoking status: Former Smoker -- 0.50 packs/day for 24 years    Types: Cigarettes    Quit date: 11/13/1965  . Smokeless tobacco: Not on file  . Alcohol Use: 0.6 oz/week    1 Glasses of wine per week     Comment: occ  . Drug Use: No  . Sexually Active: Not on file   Other Topics Concern  . Not on file   Social History Narrative  . No narrative on file    Past Surgical History  Procedure Laterality Date  . Appendectomy    . Abdominal hysterectomy    . Bilateral salpingoophorectomy    . Eye surgery      bilateral  . Tubal ligation      History reviewed. No pertinent family history.  No Known Allergies  Current Outpatient Prescriptions on File Prior to Visit  Medication Sig Dispense Refill  . aspirin 81 MG tablet Take 81 mg by mouth daily.      . Azelastine-Fluticasone (DYMISTA) 137-50 MCG/ACT SUSP Place 1-2 sprays into the nose at bedtime.      . cyanocobalamin (,VITAMIN B-12,) 1000 MCG/ML injection Inject 1 mL  (1,000 mcg total) into the muscle every 30 (thirty) days.  30 mL  1  . donepezil (ARICEPT) 10 MG tablet Take 1 tablet (10 mg total) by mouth at bedtime. 1/2 tab po qhs  30 tablet  12  . Memantine HCl ER 28 MG CP24 Take 28 mg by mouth every other day. 1-1/2 every other day       No current facility-administered medications on file prior to visit.    BP 130/70  Pulse 75  Temp(Src) 97.9 F (36.6 C) (Oral)  Resp 18  Wt 106 lb (48.081 kg)  BMI 20.7 kg/m2  SpO2 97%      Review of Systems  Constitutional: Negative.   HENT: Positive for hearing loss. Negative for congestion, sore throat, rhinorrhea, dental problem, sinus pressure and tinnitus.   Eyes: Negative for pain, discharge and visual disturbance.  Respiratory: Negative for cough and shortness of breath.   Cardiovascular: Negative for chest pain, palpitations and leg swelling.  Gastrointestinal: Negative for nausea, vomiting, abdominal pain, diarrhea, constipation, blood in stool and abdominal distention.  Genitourinary: Negative for dysuria, urgency, frequency, hematuria, flank pain, vaginal bleeding, vaginal discharge, difficulty urinating, vaginal pain and pelvic pain.  Musculoskeletal: Negative for joint  swelling, arthralgias and gait problem.  Skin: Negative for rash.  Neurological: Negative for dizziness, syncope, speech difficulty, weakness, numbness and headaches.  Hematological: Negative for adenopathy.  Psychiatric/Behavioral: Positive for sleep disturbance. Negative for behavioral problems, dysphoric mood and agitation. The patient is not nervous/anxious.        Objective:   Physical Exam  Constitutional: She is oriented to person, place, and time. She appears well-developed and well-nourished.  HENT:  Head: Normocephalic.  Right Ear: External ear normal.  Left Ear: External ear normal.  Mouth/Throat: Oropharynx is clear and moist.  Bilateral hearing aids No wax in the canals  Eyes: Conjunctivae and EOM are  normal. Pupils are equal, round, and reactive to light.  Neck: Normal range of motion. Neck supple. No thyromegaly present.  Cardiovascular: Normal rate, regular rhythm, normal heart sounds and intact distal pulses.   Pulmonary/Chest: Effort normal and breath sounds normal.  Abdominal: Soft. Bowel sounds are normal. She exhibits no mass. There is no tenderness.  Musculoskeletal: Normal range of motion.  Lymphadenopathy:    She has no cervical adenopathy.  Neurological: She is alert and oriented to person, place, and time.  Skin: Skin is warm and dry. No rash noted.  Psychiatric: She has a normal mood and affect. Her behavior is normal.          Assessment & Plan:   Mild insomnia. Patient was told to try a bedtime dose of Benadryl. Sleep hygiene issues were discussed the bed. 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

## 2013-06-12 NOTE — Patient Instructions (Addendum)
Take a calcium supplement, plus 800-1200 units of vitamin D    It is important that you exercise regularly, at least 20 minutes 3 to 4 times per week.  If you develop chest pain or shortness of breath seek  medical attention.  Return in 6 months for follow-up  

## 2013-06-13 ENCOUNTER — Ambulatory Visit (INDEPENDENT_AMBULATORY_CARE_PROVIDER_SITE_OTHER): Payer: Medicare Other

## 2013-06-13 DIAGNOSIS — D649 Anemia, unspecified: Secondary | ICD-10-CM

## 2013-06-19 MED ORDER — CYANOCOBALAMIN 1000 MCG/ML IJ SOLN
1000.0000 ug | Freq: Once | INTRAMUSCULAR | Status: AC
  Administered 2013-06-19: 1000 ug via INTRAMUSCULAR

## 2013-06-24 ENCOUNTER — Encounter: Payer: Self-pay | Admitting: Internal Medicine

## 2013-06-24 ENCOUNTER — Ambulatory Visit (INDEPENDENT_AMBULATORY_CARE_PROVIDER_SITE_OTHER): Payer: Medicare Other | Admitting: Internal Medicine

## 2013-06-24 VITALS — BP 125/64 | HR 71 | Ht 61.0 in | Wt 107.0 lb

## 2013-06-24 DIAGNOSIS — J310 Chronic rhinitis: Secondary | ICD-10-CM

## 2013-06-24 DIAGNOSIS — J449 Chronic obstructive pulmonary disease, unspecified: Secondary | ICD-10-CM

## 2013-06-24 MED ORDER — IPRATROPIUM BROMIDE 0.03 % NA SOLN: NASAL | Status: DC

## 2013-06-24 NOTE — Patient Instructions (Addendum)
Script sent for  Atrovent / ipratropium nasal spray     Try 1-2 puffs each nostril, every 6-8 hours if needed for runny nose

## 2013-06-24 NOTE — Progress Notes (Signed)
11/22/12- [name] former smoker-self referral-runny nose and dry cough x 3 years-unsure cause ? allergies   Dr Tawanna Cooler PCP Occasional dry cough first thing in the morning and especially while eating. Denies getting strangled, or any sense of reflux/ heart burn. Quit smoking remotely and denies hx of lung disease. Told once in past that she had some asthma. No wheezing in recent years. No TB exposure. Annoying watery runny nose as she gets up each morning. No hx of significant seasonal rhinitis. Not affected by weather, environment or exposure. No self- treatment.  She was in the [employer] for 2 years in [employer]. Daughter works in this office.   12/23/12- [name] former smoker-self referral-runny nose and dry cough x 3 years-unsure cause ? allergies   Dr Tawanna Cooler PCP  Mother of Nicholos Johns in our office FOLLOWS FOR: review labs with patient, still having runny nose and cough since last viit. Still watery nose, using Dymista 1 puff once daily. Main complaints are postnasal drip and dry cough. She says the cough is only because of postnasal drip "going down the wrong way". Nonproductive cough without wheeze. She can eat and drink without cough for strangle. Does not wake at night coughing. Allergy Profile 11/22/2012-total IgE 36.6. No specific elevations. I explained this is against any significant allergy process as a basis for cough. CXR 11/27/12-IMPRESSION:  COPD without acute abnormality. No interval change.  Original Report Authenticated By: Janeece Riggers, M.D.  06/24/13- [name] former smoker-self referral-runny nose and dry cough x 3 years-unsure cause ? allergies    Dr Tawanna Cooler PCP  Mother of Nicholos Johns in our office FOLLOWS [id number] to have runny nose-clear; denies any cough. Dymista nasal spray seemed to help. Daily postnasal drip with watery rhinorrhea. Does not change with weather. Cough is resolved for now, which had been her biggest complaint.   ROS-see HPI Constitutional:   No-   weight loss, night sweats,  fevers, chills, fatigue, lassitude. HEENT:   No-  headaches, difficulty swallowing, tooth/dental problems, sore throat,       No-  sneezing, itching, ear ache, nasal congestion, +post nasal drip,  CV:  No-   chest pain, orthopnea, PND, swelling in lower extremities, anasarca, dizziness, palpitations Resp: No-   shortness of breath with exertion or at rest.             No- productive cough, no-non-productive cough,  No- coughing up of blood.              No-   change in color of mucus.  No- wheezing.   Skin: No-   rash or lesions. GI:  No-   heartburn, indigestion, abdominal pain, nausea, vomiting,  GU: . MS:  No- acute  joint pain or swelling.   Neuro-     nothing unusual Psych:  No- change in mood or affect. No depression or anxiety.  No memory loss.  OBJ- Physical Exam General- Alert, Oriented, Affect-appropriate, Distress- none acute, trim Skin- rash-none, lesions- none, excoriation- none Lymphadenopathy- none Head- atraumatic            Eyes- Gross vision intact, PERRLA, conjunctivae and secretions clear            Ears- Hearing, canals-normal            Nose-  +pale/ atrophic, no-Septal dev, +white mucus bridging, polyps, erosion, perforation             Throat- Mallampati II , mucosa clear , drainage- none, tonsils- atrophic Neck- flexible ,  trachea midline, no stridor , thyroid nl, carotid no bruit Chest - symmetrical excursion , unlabored           Heart/CV- RRR , no murmur , no gallop  , no rub, nl s1 s2                           - JVD- none , edema- none, stasis changes- none, varices- none           Lung- clear, no-cough, no-wheeze, unlabored , dullness-none, rub- none           Chest wall-  Abd-  Br/ Gen/ Rectal- Not done, not indicated Extrem- cyanosis- none, clubbing, none, atrophy- none, strength- nl Neuro- grossly intact to observation

## 2013-06-25 ENCOUNTER — Encounter: Payer: Self-pay | Admitting: Family Medicine

## 2013-06-26 ENCOUNTER — Encounter: Payer: Self-pay | Admitting: Family Medicine

## 2013-06-27 MED ORDER — "SYRINGE/NEEDLE (DISP) 25G X 5/8"" 1 ML MISC": 1.0000 | Status: DC

## 2013-06-27 MED ORDER — CYANOCOBALAMIN 1000 MCG/ML IJ SOLN: 1000.0000 ug | Freq: Once | INTRAMUSCULAR | Status: DC

## 2013-07-01 ENCOUNTER — Encounter: Payer: Self-pay | Admitting: Family Medicine

## 2013-07-09 NOTE — Assessment & Plan Note (Signed)
We can return to [facility] if needed. I would like to try ipratropium nasal spray first for anticholinergic effect.

## 2013-07-09 NOTE — Assessment & Plan Note (Signed)
She says cough has resolved at the present time. We will watch this. Reminded her to watch for postnasal drip, reflux, choking with meals, wheezing.

## 2013-08-05 ENCOUNTER — Other Ambulatory Visit: Payer: Self-pay

## 2013-08-05 MED ORDER — MEMANTINE HCL 10 MG PO TABS: 10.0000 mg | ORAL_TABLET | Freq: Two times a day (BID) | ORAL | Status: DC

## 2013-08-05 NOTE — Telephone Encounter (Signed)
Pharmacy is requesting new Rx for Namenda since Namenda XR is currently unavailable.

## 2013-09-01 ENCOUNTER — Encounter: Payer: Self-pay | Admitting: Family Medicine

## 2013-09-01 ENCOUNTER — Ambulatory Visit: Payer: Medicare Other | Admitting: Family Medicine

## 2013-09-01 ENCOUNTER — Ambulatory Visit (INDEPENDENT_AMBULATORY_CARE_PROVIDER_SITE_OTHER): Payer: Medicare Other | Admitting: Family Medicine

## 2013-09-01 VITALS — Temp 98.0°F | Wt 106.0 lb

## 2013-09-01 DIAGNOSIS — L918 Other hypertrophic disorders of the skin: Secondary | ICD-10-CM | POA: Insufficient documentation

## 2013-09-01 DIAGNOSIS — L089 Local infection of the skin and subcutaneous tissue, unspecified: Secondary | ICD-10-CM

## 2013-09-01 DIAGNOSIS — L909 Atrophic disorder of skin, unspecified: Secondary | ICD-10-CM

## 2013-09-01 NOTE — Patient Instructions (Signed)
Return when necessary  I would recommend you see Dr. Ermalinda Barrios hearing specialist

## 2013-09-01 NOTE — Progress Notes (Signed)
  Subjective:    Patient ID: Lori [name], female    DOB: [birth year], 79 y.o.   MRN: [id number]  HPI Lori [name] is a 79 year old female who comes in today for removal of a lesion on her chin  She has a pedunculated lesion on her chin which is actually a skin tag however Scott inflamed and red.  After informed consent she was taken to treat room and prepped with alcohol 1% Xylocaine was used locally. The skin tag was removed and the base was cauterized she tolerated procedure no complications   Review of Systems Review of systems otherwise negative except for profound hearing loss unresponsive to hearing aids recommend she see Dr. Tia Masker    Objective:   Physical Exam Procedure see above       Assessment & Plan:  Inflamed skin tag on the face removed as above

## 2013-09-04 ENCOUNTER — Encounter: Payer: Self-pay | Admitting: Family Medicine

## 2013-11-03 ENCOUNTER — Encounter: Payer: Self-pay | Admitting: Internal Medicine

## 2013-11-03 NOTE — Telephone Encounter (Signed)
At her August OV we gave script for ipratropium 0.03% nasal spray. That usually controls watery nasal drip for 3-4 hours. If it didn't help enough, offer   Rx Ipratropium 0.06% nasal spray     1-2 puffs each nostril three times daily as needed. If that doesn't help, I can see her here and see if I have any other ideas.

## 2013-11-04 ENCOUNTER — Encounter: Payer: Self-pay | Admitting: Family Medicine

## 2013-11-04 ENCOUNTER — Other Ambulatory Visit: Payer: Self-pay | Admitting: Family Medicine

## 2013-11-04 MED ORDER — IPRATROPIUM BROMIDE 0.06 % NA SOLN: 2.0000 | Freq: Three times a day (TID) | NASAL | Status: DC | PRN

## 2013-11-04 NOTE — Addendum Note (Signed)
Addended by: Caryl Ada on: 11/04/2013 08:34 AM   Modules accepted: Orders

## 2013-11-20 ENCOUNTER — Encounter: Payer: Self-pay | Admitting: Internal Medicine

## 2013-11-21 ENCOUNTER — Ambulatory Visit (INDEPENDENT_AMBULATORY_CARE_PROVIDER_SITE_OTHER): Payer: Medicare Other | Admitting: Nurse Practitioner

## 2013-11-21 ENCOUNTER — Encounter: Payer: Self-pay | Admitting: Nurse Practitioner

## 2013-11-21 VITALS — BP 113/63 | HR 72 | Ht 61.0 in | Wt 105.0 lb

## 2013-11-21 DIAGNOSIS — R413 Other amnesia: Secondary | ICD-10-CM

## 2013-11-21 NOTE — Progress Notes (Signed)
[facility]  PATIENT: Lori [name] DOB: [birth year]   REASON FOR VISIT: follow up for memory loss   HISTORY OF PRESENT ILLNESS: Lori [name], 80 year old female returns for followup. She has a history of memory loss. She was last seen in our office 05/21/2013 by Dr. Terrace Arabia. She is currently on Namenda 10 twice daily and Aricept 5 mg daily. She continues to live independently and  is accompanied by her daughter today. No safety issues identified in the home.  HISTORY: of memory loss, visual hallucination She was previously healthy, living independently, had more than 12 years of education, has 6 children, retired as a Diplomatic Services operational officer, still working part-time job, 2 days a week,. She is a Education administrator.  Her son noticed that she has mild memory loss, she still lives independently, driving short distance, cooking, doing laundry all by herself, since February 2014, she began to have episode of visual hallucination, she sees people in her room, talking, came in through the window, sometimes very bothersome for her.  She denies gait difficulty, no loss sense of smells.  Most recent laboratory evaluation showed normal CBC, CMP, TSH, she is on B12 IM supplement   REVIEW OF SYSTEMS: Full 14 system review of systems performed and notable only for those listed, all others are neg:  Constitutional: N/A  Cardiovascular: N/A  Ear/Nose/Throat: N/A  Skin: N/A  Eyes: N/A  Respiratory: N/A  Gastroitestinal: N/A  Hematology/Lymphatic: N/A  Endocrine: N/A Musculoskeletal:N/A  Allergy/Immunology: N/A  Neurological: N/A Psychiatric: N/A   ALLERGIES: No Known Allergies  HOME MEDICATIONS: Outpatient Prescriptions Prior to Visit  Medication Sig Dispense Refill  . aspirin 81 MG tablet Take 81 mg by mouth daily.      . cyanocobalamin (,VITAMIN B-12,) 1000 MCG/ML injection Inject 1 mL (1,000 mcg total) into the muscle every 30 (thirty) days.  30 mL  1  . diphenhydrAMINE (BENADRYL) 25  mg capsule Take 25 mg by mouth at bedtime as needed for itching.      . donepezil (ARICEPT) 10 MG tablet Take 1 tablet (10 mg total) by mouth at bedtime. 1/2 tab po qhs  30 tablet  12  . ipratropium (ATROVENT) 0.06 % nasal spray Place 2 sprays into both nostrils 3 (three) times daily as needed for rhinitis.  15 mL  5  . memantine (NAMENDA) 10 MG tablet Take 1 tablet (10 mg total) by mouth 2 (two) times daily.  60 tablet  6  . methenamine (HIPREX) 1 G tablet Take 0.5 g by mouth every other day.       . SYRINGE/NEEDLE, DISP, 1 ML (BD ECLIPSE SYRINGE) 25G X 5/8" 1 ML MISC 1 each by Does not apply route every 30 (thirty) days.  100 each  0  . cyanocobalamin (,VITAMIN B-12,) 1000 MCG/ML injection INJECT 1 ML (1,000 MCG TOTAL) INTO THE MUSCLE ONCE.  30 mL  3  . ipratropium (ATROVENT) 0.03 % nasal spray 1-2 sprays each nostril, every 6-8 hours if needed  30 mL  12   No facility-administered medications prior to visit.    PAST MEDICAL HISTORY: Past Medical History  Diagnosis Date  . Osteoporosis   . Mitral stenosis   . Migraine   . Hearing loss   . Pneumonia   . Cataract     bilateral  . UTI (urinary tract infection)   . Memory change     PAST SURGICAL HISTORY: Past Surgical History  Procedure Laterality Date  . Appendectomy    . Abdominal  hysterectomy    . Bilateral salpingoophorectomy    . Eye surgery      bilateral  . Tubal ligation      FAMILY HISTORY: History reviewed. No pertinent family history.  SOCIAL HISTORY: History   Social History  . Marital Status: Widowed    Spouse Name: N/A    Number of Children: 6  . Years of Education: 12+   Occupational History  . Retired    Social History Main Topics  . Smoking status: Former Smoker -- 0.50 packs/day for 24 years    Types: Cigarettes    Quit date: 11/13/1965  . Smokeless tobacco: Never Used  . Alcohol Use: 0.6 oz/week    1 Glasses of wine per week     Comment: occ  . Drug Use: No  . Sexual Activity: Not on file    Other Topics Concern  . Not on file   Social History Narrative   Patient is widowed and lives alone.   Patient had six children and has 5 living children now.   Patient is retired.   Patient has a high school education and [employer]   Patient is right-handed.   Patient drinks 2-3 cups of coffee daily.     PHYSICAL EXAM  Filed Vitals:   11/21/13 1024  BP: 113/63  Pulse: 72  Height: 5\' 1"  (1.549 m)  Weight: 105 lb (47.628 kg)   Body mass index is 19.85 kg/(m^2).  Generalized: Well developed, in no acute distress  Head: normocephalic and atraumatic,. Oropharynx benign  Neck: Supple, no carotid bruits  Cardiac: Regular rate rhythm, no murmur  Musculoskeletal: No deformity   Neurological examination   Mentation: Alert oriented to time, place, history taking. MMSE 26/30. AFT 12. Follows all commands speech and language fluent  Cranial nerve II-XII: Pupils were equal round reactive to light extraocular movements were full, visual field were full on confrontational test. Facial sensation and strength were normal. Very HOH with bil hearing aids. Uvula tongue midline. head turning and shoulder shrug were normal and symmetric.Tongue protrusion into cheek strength was normal. Motor: normal bulk and tone, full strength in the BUE, BLE,  No focal weakness. Kyphosis Coordination: finger-nose-finger, heel-to-shin bilaterally, no dysmetria Reflexes: Brachioradialis 2/2, biceps 2/2, triceps 2/2, patellar 2/2, Achilles 2/2, plantar responses were flexor bilaterally. Gait and Station: Rising up from seated position without assistance, normal stance,  moderate stride, good arm swing, smooth turning, able to perform tiptoe, and heel walking without difficulty.   DIAGNOSTIC DATA (LABS, IMAGING, TESTING) - I reviewed patient records, labs, notes, testing and imaging myself where available.  Lab Results  Component Value Date   WBC 4.1* 02/18/2013   HGB 12.9 02/18/2013   HCT 38.1 02/18/2013    MCV 92.2 02/18/2013   PLT 238.0 02/18/2013      Component Value Date/Time   NA 142 02/18/2013 1048   K 4.8 02/18/2013 1048   CL 104 02/18/2013 1048   CO2 27 02/18/2013 1048   GLUCOSE 89 02/18/2013 1048   BUN 15 02/18/2013 1048   CREATININE 0.6 02/18/2013 1048   CALCIUM 10.1 02/18/2013 1048   PROT 7.2 12/31/2012 2020   ALBUMIN 3.9 12/31/2012 2020   AST 14 12/31/2012 2020   ALT 9 12/31/2012 2020   ALKPHOS 62 12/31/2012 2020   BILITOT 0.2* 12/31/2012 2020   GFRNONAA 73* 12/31/2012 2020   GFRAA 85* 12/31/2012 2020     Lab Results  Component Value Date   VITAMINB12 549 02/18/2013   Lab  Results  Component Value Date   TSH 1.44 02/18/2013      ASSESSMENT AND PLAN  80 y.o. year old female  has a past medical history of Osteoporosis; Mitral stenosis; Migraine; Hearing loss; Pneumonia; Cataract; and Memory change. here to followup. Patient currently on Namenda 10 twice a day and Aricept 5 mg daily  Continue Aricept 5mg  or 1/2 tab at hs Continue Namenda 10mg  BID , daughter to call about RX and need to refill F/U in 6 months Nilda Riggs, [facility], [facility], APRN  [facility] [address] [place], [place] [address] [phone]

## 2013-11-21 NOTE — Patient Instructions (Addendum)
Continue Aricept 5mg  or 1/2 tab at hs Continue Namenda 10mg  BID will refill F/U in 6 months

## 2013-11-25 ENCOUNTER — Encounter: Payer: Self-pay | Admitting: Internal Medicine

## 2013-11-25 ENCOUNTER — Ambulatory Visit (INDEPENDENT_AMBULATORY_CARE_PROVIDER_SITE_OTHER): Payer: Medicare Other | Admitting: Internal Medicine

## 2013-11-25 VITALS — BP 114/62 | HR 75 | Ht 61.0 in | Wt 108.6 lb

## 2013-11-25 DIAGNOSIS — J310 Chronic rhinitis: Secondary | ICD-10-CM

## 2013-11-25 DIAGNOSIS — J449 Chronic obstructive pulmonary disease, unspecified: Secondary | ICD-10-CM

## 2013-11-25 NOTE — Progress Notes (Signed)
11/22/12- [name] former smoker-self referral-runny nose and dry cough x 3 years-unsure cause ? allergies   Dr Tawanna Coolerodd PCP Occasional dry cough first thing in the morning and especially while eating. Denies getting strangled, or any sense of reflux/ heart burn. Quit smoking remotely and denies hx of lung disease. Told once in past that she had some asthma. No wheezing in recent years. No TB exposure. Annoying watery runny nose as she gets up each morning. No hx of significant seasonal rhinitis. Not affected by weather, environment or exposure. No self- treatment.  She was in the [employer] for 2 years in [employer]. Daughter works in this office.   12/23/12- [name] former smoker-self referral-runny nose and dry cough x 3 years-unsure cause ? allergies   Dr Tawanna Coolerodd PCP  Mother of Lori [name] in our office FOLLOWS FOR: review labs with patient, still having runny nose and cough since last viit. Still watery nose, using Dymista 1 puff once daily. Main complaints are postnasal drip and dry cough. She says the cough is only because of postnasal drip "going down the wrong way". Nonproductive cough without wheeze. She can eat and drink without cough for strangle. Does not wake at night coughing. Allergy Profile 11/22/2012-total IgE 36.6. No specific elevations. I explained this is against any significant allergy process as a basis for cough. CXR 11/27/12-IMPRESSION:  COPD without acute abnormality. No interval change.  Original Report Authenticated By: Janeece Riggersavid Clark, M.D.  06/24/13- [name] former smoker-self referral-runny nose and dry cough x 3 years-unsure cause ? allergies    Dr Tawanna Coolerodd PCP  Mother of Lori [name] in our office FOLLOWS KGM:[id number]:Continues to have runny nose-clear; denies any cough. Dymista nasal spray seemed to help. Daily postnasal drip with watery rhinorrhea. Does not change with weather. Cough is resolved for now, which had been her biggest complaint.  11/25/13-  [name] former smoker-self referral-runny nose and dry  cough x 3 years-unsure cause ? allergies FOLLOWS UYQ:[id number]:continues to have runny nose through out the day; no trouble at night. Denies chest congestion or cough. Failed to Dymista. Using ipratropium 0.06% nasal spray.  ROS-see HPI Constitutional:   No-   weight loss, night sweats, fevers, chills, fatigue, lassitude. HEENT:   No-  headaches, difficulty swallowing, tooth/dental problems, sore throat,       No-  sneezing, itching, ear ache, nasal congestion, +post nasal drip,  CV:  No-   chest pain, orthopnea, PND, swelling in lower extremities, anasarca, dizziness, palpitations Resp: No-   shortness of breath with exertion or at rest.             No- productive cough, no-non-productive cough,  No- coughing up of blood.              No-   change in color of mucus.  No- wheezing.   Skin: No-   rash or lesions. GI:  No-   heartburn, indigestion, abdominal pain, nausea, vomiting,  GU: . MS:  No- acute  joint pain or swelling.   Neuro-     nothing unusual Psych:  No- change in mood or affect. No depression or anxiety.  No memory loss.  OBJ- Physical Exam General- Alert, Oriented, Affect-appropriate, Distress- none acute, trim Skin- rash-none, lesions- none, excoriation- none Lymphadenopathy- none Head- atraumatic            Eyes- Gross vision intact, PERRLA, conjunctivae and secretions clear            Ears- Hearing, canals-normal  Nose-  +pale/ atrophic, no-Septal dev, no- mucus bridging, polyps, erosion, perforation             Throat- Mallampati II , mucosa clear , drainage- none, tonsils- atrophic Neck- flexible , trachea midline, no stridor , thyroid nl, carotid no bruit Chest - symmetrical excursion , unlabored           Heart/CV- RRR , no murmur , no gallop  , no rub, nl s1 s2                           - JVD- none , edema- none, stasis changes- none, varices- none           Lung- clear, no-cough, no-wheeze, unlabored , dullness-none, rub- none           Chest wall-  Abd-  Br/  Gen/ Rectal- Not done, not indicated Extrem- cyanosis- none, clubbing, none, atrophy- none, strength- nl Neuro- grossly intact to observation

## 2013-11-25 NOTE — Patient Instructions (Addendum)
Try otc nasal spray Nasalcrom/ cromolyn    Follow directions on box. See if this works any better for your watery nose.   Try otc antihistamine Allegra 180    As an alternative you could try otc chlortimeton antihistamine, but this is more likely to make you drowsy.   If your nose keeps bothering you, then we would like you to see an ENT

## 2013-12-10 ENCOUNTER — Encounter: Payer: Self-pay | Admitting: Family Medicine

## 2013-12-11 ENCOUNTER — Ambulatory Visit (INDEPENDENT_AMBULATORY_CARE_PROVIDER_SITE_OTHER): Payer: Medicare Other | Admitting: Family Medicine

## 2013-12-11 ENCOUNTER — Encounter: Payer: Self-pay | Admitting: Family Medicine

## 2013-12-11 VITALS — BP 120/70 | Temp 98.2°F | Wt 108.0 lb

## 2013-12-11 DIAGNOSIS — H918X9 Other specified hearing loss, unspecified ear: Secondary | ICD-10-CM

## 2013-12-11 DIAGNOSIS — I10 Essential (primary) hypertension: Secondary | ICD-10-CM

## 2013-12-11 NOTE — Patient Instructions (Addendum)
Stop the Benadryl  Begin Zyrtec 10 mg,,,,,,,,,,,,,, one tablet nightly at bedtime  [facility].......... has an audiology department in her hearing aids are 50% cheaper than anyone in town  Return in 3 months for followup

## 2013-12-11 NOTE — Progress Notes (Signed)
Pre visit review using our clinic review tool, if applicable. No additional management support is needed unless otherwise documented below in the visit note. 

## 2013-12-11 NOTE — Progress Notes (Signed)
   Subjective:    Patient ID: Lori [name], female    DOB: 11/28/1919, 80 y.o.   MRN: [id number]  HPI Lori [name] is a 80 year old,,,,,,, widowed female x2,,,,,,,, who comes in today for evaluation of multiple issues  She has a history of sleep dysfunction for which he takes Benadryl and sometimes it works and sometimes and doesn't  She takes Aricept and Namenda via neurology for early dementia  She takes vitamin B12 shots once monthly for B12 deficiency  She's had allergic rhinitis now for the last 6 months and would like some treatment options   Review of Systems Review of systems otherwise negative she continues to volunteer at the [employer]. She's [facility] however they put her on a long long waiting list.    Objective:   Physical Exam  Well-developed well-nourished female no acute distress weight stable 108 BP normal 120/70      Assessment & Plan:  B12 deficiency continue B12 1 cc monthly  Dementia continue Aricept and Namenda year followup by neurology  Allergic rhinitis Atrovent nasal spray and Zyrtec plain each bedtime  [facility]

## 2013-12-12 ENCOUNTER — Telehealth: Payer: Self-pay | Admitting: Family Medicine

## 2013-12-12 NOTE — Telephone Encounter (Signed)
Relevant patient education assigned to patient using Emmi. ° °

## 2013-12-17 ENCOUNTER — Ambulatory Visit (INDEPENDENT_AMBULATORY_CARE_PROVIDER_SITE_OTHER): Payer: Medicare Other | Admitting: Adult Health

## 2013-12-17 ENCOUNTER — Encounter: Payer: Self-pay | Admitting: Adult Health

## 2013-12-17 VITALS — BP 112/64 | HR 78 | Temp 97.1°F | Ht 61.0 in | Wt 107.0 lb

## 2013-12-17 DIAGNOSIS — H6120 Impacted cerumen, unspecified ear: Secondary | ICD-10-CM

## 2013-12-17 NOTE — Assessment & Plan Note (Signed)
Bilateral cerumen impaction -ear irrigation and cerumen extraction  Tolerated procedure well.  Advised to use debrox As needed   follow up Dr. Maple HudsonYoung  As planned and As needed

## 2013-12-17 NOTE — Progress Notes (Signed)
11/22/12- [name] former smoker-self referral-runny nose and dry cough x 3 years-unsure cause ? allergies   Dr Tawanna Coolerodd PCP Occasional dry cough first thing in the morning and especially while eating. Denies getting strangled, or any sense of reflux/ heart burn. Quit smoking remotely and denies hx of lung disease. Told once in past that she had some asthma. No wheezing in recent years. No TB exposure. Annoying watery runny nose as she gets up each morning. No hx of significant seasonal rhinitis. Not affected by weather, environment or exposure. No self- treatment.  She was in the [employer] for 2 years in [employer]. Daughter works in this office.   12/23/12- [name] former smoker-self referral-runny nose and dry cough x 3 years-unsure cause ? allergies   Dr Tawanna Coolerodd PCP  Mother of Nicholos JohnsKathleen in our office FOLLOWS FOR: review labs with patient, still having runny nose and cough since last viit. Still watery nose, using Dymista 1 puff once daily. Main complaints are postnasal drip and dry cough. She says the cough is only because of postnasal drip "going down the wrong way". Nonproductive cough without wheeze. She can eat and drink without cough for strangle. Does not wake at night coughing. Allergy Profile 11/22/2012-total IgE 36.6. No specific elevations. I explained this is against any significant allergy process as a basis for cough. CXR 11/27/12-IMPRESSION:  COPD without acute abnormality. No interval change.  Original Report Authenticated By: Janeece Riggersavid Clark, M.D.  06/24/13- [name] former smoker-self referral-runny nose and dry cough x 3 years-unsure cause ? allergies    Dr Tawanna Coolerodd PCP  Mother of Nicholos JohnsKathleen in our office FOLLOWS OZH:[id number]:Continues to have runny nose-clear; denies any cough. Dymista nasal spray seemed to help. Daily postnasal drip with watery rhinorrhea. Does not change with weather. Cough is resolved for now, which had been her biggest complaint.  11/25/13-  [name] former smoker-self referral-runny nose and dry  cough x 3 years-unsure cause ? allergies FOLLOWS GEX:[id number]:continues to have runny nose through out the day; no trouble at night.  12/17/2013 Work in visit  Pt returns for ear wash. Was seen couple of weeks ago, cerumen impaction in ears. Is getting new hearing aides and has too much wax for fitting. Wants ears washed out.  No ear pain.    ROS-see HPI Constitutional:   No-   weight loss, night sweats, fevers, chills, fatigue, lassitude. HEENT:   No-  headaches, difficulty swallowing, tooth/dental problems, sore throat,       No-  sneezing, itching, ear ache, nasal congestion, +post nasal drip, +HOH  Psych:  No- change in mood or affect. No depression or anxiety.  + memory loss.  OBJ- Physical Exam-limited exam  GEN: A/Ox3; pleasant , NAD, elderly and frail   HEENT:  [place]/AT,  EACs- bilateral cerumen impaction  NOSE-clear, THROAT-clear, no lesions, no postnasal drip or exudate noted.

## 2013-12-20 NOTE — Assessment & Plan Note (Signed)
Failed Dymista. Ipratropium may be some help, as expected for a vasomotor rhinitis. Plan-try Allegra, Nasalcrom

## 2013-12-20 NOTE — Assessment & Plan Note (Signed)
controlled 

## 2013-12-25 ENCOUNTER — Encounter: Payer: Self-pay | Admitting: Internal Medicine

## 2013-12-25 DIAGNOSIS — J310 Chronic rhinitis: Secondary | ICD-10-CM

## 2013-12-26 NOTE — Addendum Note (Signed)
Addended by: Caryl AdaARROLL, LINDSAY N on: 12/26/2013 01:40 PM   Modules accepted: Orders

## 2013-12-26 NOTE — Telephone Encounter (Signed)
Dr Young please advise thanks 

## 2013-12-26 NOTE — Telephone Encounter (Signed)
[facility]      "Chronic rhinorhea after fall- ?cribiform leak?

## 2014-01-20 ENCOUNTER — Telehealth: Payer: Self-pay | Admitting: Family Medicine

## 2014-01-20 MED ORDER — HYDROCODONE-HOMATROPINE 5-1.5 MG/5ML PO SYRP: ORAL_SOLUTION | ORAL | Status: DC

## 2014-01-20 NOTE — Telephone Encounter (Signed)
Rx ready for pick up and daughter is aware

## 2014-01-20 NOTE — Telephone Encounter (Signed)
Pt's daughter is calling wanting to see if dr. Tawanna Coolerodd can see her mother today, states pt has had a bad cough for over 1 week now. Want to see if there is anyway dr. Tawanna Coolerodd can see her today.

## 2014-01-31 ENCOUNTER — Encounter: Payer: Self-pay | Admitting: Family Medicine

## 2014-02-02 MED ORDER — HYDROCODONE-HOMATROPINE 5-1.5 MG/5ML PO SYRP: ORAL_SOLUTION | ORAL | Status: DC

## 2014-03-03 ENCOUNTER — Other Ambulatory Visit: Payer: Self-pay | Admitting: Neurology

## 2014-03-04 ENCOUNTER — Other Ambulatory Visit: Payer: Self-pay | Admitting: Neurology

## 2014-03-16 ENCOUNTER — Ambulatory Visit: Payer: Medicare Other | Admitting: Podiatry

## 2014-03-16 ENCOUNTER — Ambulatory Visit (INDEPENDENT_AMBULATORY_CARE_PROVIDER_SITE_OTHER): Payer: Medicare Other | Admitting: Podiatry

## 2014-03-16 ENCOUNTER — Encounter: Payer: Self-pay | Admitting: Podiatry

## 2014-03-16 ENCOUNTER — Ambulatory Visit (INDEPENDENT_AMBULATORY_CARE_PROVIDER_SITE_OTHER): Payer: Medicare Other

## 2014-03-16 VITALS — Ht 62.0 in | Wt 103.0 lb

## 2014-03-16 DIAGNOSIS — M779 Enthesopathy, unspecified: Secondary | ICD-10-CM

## 2014-03-16 DIAGNOSIS — M79609 Pain in unspecified limb: Secondary | ICD-10-CM

## 2014-03-16 NOTE — Progress Notes (Signed)
   Subjective:    Patient ID: Lori [name], female    DOB: 08/02/1920, 80 y.o.   MRN: [id number]  HPI Comments: N foot pain L 1 - 10 toes D 3 weeks O  C throbbing pain A enclosed shoes T soaked feet in Epsom salt soaks  Pt states heels hurt, going on 2 months when in shoes and weightbearing. Pt request debridement of 1 - 10 toenails.  Foot Pain      Review of Systems  All other systems reviewed and are negative.      Objective:   Physical Exam Orientated [phone] 80 year old white female presents with her daughter  Vascular: DP and PT pulses 2/4 bilaterally  Neurological: Sensation to 10 g monofilament wire intact one/5 bilaterally Vibratory sensation nonreactive bilaterally Ankle reflexes reactive bilaterally  Dermatological: Atrophic skin noted bilaterally Toenails are normal trophic x10  Musculoskeletal: Unstable gait pattern noted Hammertoe deformities 2-5 bilaterally There is no restriction ankle, subtalar, midtarsal joints bilaterally  X-ray report left foot  Intact bony structure without fracture and/or dislocation noted. Generalized decreased bone density noted in all views. Mild hammering of toes 2-5 bilaterally.  Radiographic impression:  No acute bony abnormality noted left foot  Xray examination right foot   Intact bony structure without fracture and/or dislocation noted. Generalized decreased bone density noted in all views. Hammered second toe noted.  Radiographic impression:  No acute bony abnormality noted in the right foot                   Assessment & Plan:   Assessment: Peripheral neuropathy bilaterally Generalized arthropathy bilaterally  Plan: At this time I advised patient that there were some size of possible neuropathy affecting the foot area. Advised patient to purchase a sturdy pair of shoes and tried over-the-counter soft arch support.  Reappoint at patient's request

## 2014-03-16 NOTE — Patient Instructions (Signed)
Try shoes with thick rubber soles with over-the-counter soft arch pads.  Use over-the-counter Tylenol if needed for pain control.

## 2014-03-17 ENCOUNTER — Encounter: Payer: Self-pay | Admitting: Podiatry

## 2014-03-19 ENCOUNTER — Encounter: Payer: Self-pay | Admitting: Internal Medicine

## 2014-03-19 NOTE — Telephone Encounter (Signed)
Happy to see if needed. 

## 2014-03-25 ENCOUNTER — Ambulatory Visit: Payer: Medicare Other | Admitting: Internal Medicine

## 2014-04-13 ENCOUNTER — Encounter: Payer: Self-pay | Admitting: Family Medicine

## 2014-04-13 ENCOUNTER — Ambulatory Visit (INDEPENDENT_AMBULATORY_CARE_PROVIDER_SITE_OTHER): Payer: Medicare Other | Admitting: Family Medicine

## 2014-04-13 VITALS — BP 120/70 | Temp 98.1°F | Wt 107.0 lb

## 2014-04-13 DIAGNOSIS — N3000 Acute cystitis without hematuria: Secondary | ICD-10-CM

## 2014-04-13 DIAGNOSIS — R350 Frequency of micturition: Secondary | ICD-10-CM

## 2014-04-13 DIAGNOSIS — L84 Corns and callosities: Secondary | ICD-10-CM

## 2014-04-13 LAB — POCT URINALYSIS DIPSTICK
Bilirubin, UA: NEGATIVE
Blood, UA: NEGATIVE
Glucose, UA: NEGATIVE
Ketones, UA: NEGATIVE
NITRITE UA: POSITIVE
PH UA: 6.5
PROTEIN UA: NEGATIVE
Spec Grav, UA: <=1.005
UROBILINOGEN UA: 0.2

## 2014-04-13 MED ORDER — SULFAMETHOXAZOLE-TMP DS 800-160 MG PO TABS: 1.0000 | ORAL_TABLET | Freq: Two times a day (BID) | ORAL | Status: DC

## 2014-04-13 NOTE — Patient Instructions (Signed)
Septra DS............ one twice daily for 1 week then one half tab at bedtime till bilateral empty  Moleskin............ apply in the morning remove at bedtime.

## 2014-04-13 NOTE — Progress Notes (Signed)
Pre visit review using our clinic review tool, if applicable. No additional management support is needed unless otherwise documented below in the visit note. 

## 2014-04-13 NOTE — Progress Notes (Signed)
   Subjective:    Patient ID: Lori [name], female    DOB: 1920/01/31, 80 y.o.   MRN: [id number]  HPI Lori [name] is a 80 year old widowed female.......Marland Kitchen X2......... who comes in today for evaluation of 2 problems  For the last 3 months she said she is having trouble with urine. She been going more frequently. No fever chills back pain nausea vomiting or diarrhea or hematuria.  She's also having pain in the left fifth toe medial portion...   Review of Systems     review of systems otherwise negative  Objective:   Physical Exam Well-developed well-nourished female no acute distress vital signs stable she is afebrile urinalysis shows nitrate-positive positive for white cells consistent with a urinary tract infection  Examination of foot shows a tenderness medial portion of the left fifth toe there is some deformity in his room and on her fourth toe. No infection       Assessment & Plan:Your track infection Septra DS twice a day for 10 days  Pain left fifth toe  Pain left fifth toe...........Marland Kitchen moleskin  Your track infection............ Septra DS for 10 days

## 2014-05-03 ENCOUNTER — Other Ambulatory Visit: Payer: Self-pay | Admitting: Internal Medicine

## 2014-05-19 ENCOUNTER — Encounter: Payer: Self-pay | Admitting: Internal Medicine

## 2014-05-21 ENCOUNTER — Ambulatory Visit: Payer: Medicare Other | Admitting: Nurse Practitioner

## 2014-05-26 ENCOUNTER — Encounter: Payer: Self-pay | Admitting: Family Medicine

## 2014-05-26 ENCOUNTER — Encounter: Payer: Self-pay | Admitting: Nurse Practitioner

## 2014-05-26 ENCOUNTER — Ambulatory Visit (INDEPENDENT_AMBULATORY_CARE_PROVIDER_SITE_OTHER): Payer: Medicare Other | Admitting: Nurse Practitioner

## 2014-05-26 VITALS — BP 128/57 | HR 64 | Ht 62.0 in | Wt 106.6 lb

## 2014-05-26 DIAGNOSIS — R413 Other amnesia: Secondary | ICD-10-CM

## 2014-05-26 MED ORDER — DONEPEZIL HCL 10 MG PO TABS: 10.0000 mg | ORAL_TABLET | Freq: Every day | ORAL | Status: DC

## 2014-05-26 NOTE — Patient Instructions (Signed)
Memory score is stable Continue Aricept at current dose will refill Continue Namenda at current dose does not need refills Followup in 6 months

## 2014-05-26 NOTE — Progress Notes (Signed)
[facility]  PATIENT: Lori [name] DOB: 05/15/1920   REASON FOR VISIT: Followup for memory loss    HISTORY OF PRESENT ILLNESS:Lori [name], 80 year old female returns for followup. She has a history of memory loss. She was last seen in our office 11/21/13.  She is currently on Namenda 10 twice daily and Aricept 10mg  daily. She continues to live independently and is accompanied by her daughter today. No safety issues identified in the home. One fall in the last 6 months. The daughter feels memory is stable. She returns for reevaluation   HISTORY: of memory loss, visual hallucination She was previously healthy, living independently, had more than 12 years of education, has 6 children, retired as a Diplomatic Services operational officersecretary, still working part-time job, 2 days a week,. 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  Her son noticed that she has mild memory loss, she still lives independently, driving short distance, cooking, doing laundry all by herself, since February 2014, she began to have episode of visual hallucination, she sees people in her room, talking, came in through the window, sometimes very bothersome for her.  She denies gait difficulty, no loss sense of smells.  Most recent laboratory evaluation showed normal CBC, CMP, TSH, she is on B12 IM supplement  REVIEW OF SYSTEMS: Full 14 system review of systems performed and notable only for those listed, all others are neg:  Constitutional: N/A  Cardiovascular: N/A  Ear/Nose/Throat: N/A  Skin: N/A  Eyes: N/A  Respiratory: N/A  Gastroitestinal: N/A  Hematology/Lymphatic: N/A  Endocrine: N/A Musculoskeletal:N/A  Allergy/Immunology: N/A  Neurological: N/A Psychiatric: N/A Sleep : NA   ALLERGIES: No Known Allergies  HOME MEDICATIONS: Outpatient Prescriptions Prior to Visit  Medication Sig Dispense Refill  . aspirin 81 MG tablet Take 81 mg by mouth daily.      . Calcium Carbonate-Vitamin D (CALCIUM PLUS VITAMIN D PO) Take by mouth.        . cetirizine (ZYRTEC) 10 MG tablet Take 10 mg by mouth at bedtime.      . cyanocobalamin (,VITAMIN B-12,) 1000 MCG/ML injection Inject 1 mL (1,000 mcg total) into the muscle every 30 (thirty) days.  30 mL  1  . donepezil (ARICEPT) 10 MG tablet 1/2 to 1 tab po qhs      . ipratropium (ATROVENT) 0.06 % nasal spray PLACE 2 SPRAYS INTO BOTH NOSTRILS 3 (THREE) TIMES DAILY AS NEEDED FOR RHINITIS.  15 mL  2  . methenamine (HIPREX) 1 G tablet Take 0.5 g by mouth every other day.       Marland Kitchen. NAMENDA 10 MG tablet TAKE 1 TABLET (10 MG TOTAL) BY MOUTH 2 (TWO) TIMES DAILY.  60 tablet  6  . sulfamethoxazole-trimethoprim (BACTRIM DS) 800-160 MG per tablet Take 1 tablet by mouth 2 (two) times daily.  20 tablet  1  . SYRINGE/NEEDLE, DISP, 1 ML (BD ECLIPSE SYRINGE) 25G X 5/8" 1 ML MISC 1 each by Does not apply route every 30 (thirty) days.  100 each  0   No facility-administered medications prior to visit.    PAST MEDICAL HISTORY: Past Medical History  Diagnosis Date  . Osteoporosis   . Mitral stenosis   . Migraine   . Hearing loss   . Pneumonia   . Cataract     bilateral  . UTI (urinary tract infection)   . Memory change     PAST SURGICAL HISTORY: Past Surgical History  Procedure Laterality Date  . Appendectomy    . Abdominal hysterectomy    .  Bilateral salpingoophorectomy    . Eye surgery      bilateral  . Tubal ligation      FAMILY HISTORY: History reviewed. No pertinent family history.  SOCIAL HISTORY: History   Social History  . Marital Status: Widowed    Spouse Name: N/A    Number of Children: 6  . Years of Education: 12+   Occupational History  . Retired    Social History Main Topics  . Smoking status: Former Smoker -- 0.50 packs/day for 24 years    Types: Cigarettes    Quit date: 11/13/1965  . Smokeless tobacco: Never Used  . Alcohol Use: 0.6 oz/week    1 Glasses of wine per week     Comment: occ  . Drug Use: No  . Sexual Activity: Not on file   Other Topics Concern   . Not on file   Social History Narrative   Patient is widowed and lives alone.   Patient had six children and has 5 living children now.   Patient is retired.   Patient has a high school education and [employer].   Patient is right-handed.   Patient drinks 2-3 cups of coffee daily.     PHYSICAL EXAM  Filed Vitals:   05/26/14 1141  BP: 128/57  Pulse: 64  Height: 5\' 2"  (1.575 m)  Weight: 106 lb 9.6 oz (48.353 kg)   Body mass index is 19.49 kg/(m^2). Generalized: Well developed, in no acute distress  Head: normocephalic and atraumatic,. Oropharynx benign  Neck: Supple, no carotid bruits  Cardiac: Regular rate rhythm, no murmur  Musculoskeletal: No deformity  Neurological examination  Mentation: Alert oriented to time, place, history taking. MMSE 28/30. AFT 7. Follows all commands speech and language fluent  Cranial nerve II-XII: Pupils were equal round reactive to light extraocular movements were full, visual field were full on confrontational test. Facial sensation and strength were normal. Very HOH with bil hearing aids. Uvula tongue midline. head turning and shoulder shrug were normal and symmetric.Tongue protrusion into cheek strength was normal.  Motor: normal bulk and tone, full strength in the BUE, BLE, No focal weakness. Kyphosis  Coordination: finger-nose-finger, heel-to-shin bilaterally, no dysmetria  Reflexes: Brachioradialis 2/2, biceps 2/2, triceps 2/2, patellar 2/2, Achilles 2/2, plantar responses were flexor bilaterally.  Gait and Station: Rising up from seated position without assistance, wide based  stance, moderate stride, good arm swing, smooth turning, able to perform tiptoe, and heel walking without difficulty. No assistive device DIAGNOSTIC DATA (LABS, IMAGING, TESTING) -  ASSESSMENT AND PLAN  80 y.o. year old female  has a past medical history of Osteoporosis; Mitral stenosis; Migraine;  and Memory change. here to followup for memory loss.  Continue  Aricept at current dose will refill Continue Namenda at current dose does not need refills Followup in 6 months, next visit with Dr. Carmelina Noun, [facility], [facility], APRN  [facility] [address], [address] [place], [place] [address] [phone]

## 2014-06-30 ENCOUNTER — Ambulatory Visit (INDEPENDENT_AMBULATORY_CARE_PROVIDER_SITE_OTHER): Payer: Medicare Other | Admitting: Family Medicine

## 2014-06-30 ENCOUNTER — Encounter: Payer: Self-pay | Admitting: Family Medicine

## 2014-06-30 VITALS — BP 130/64 | Temp 98.0°F | Wt 109.0 lb

## 2014-06-30 DIAGNOSIS — G252 Other specified forms of tremor: Secondary | ICD-10-CM

## 2014-06-30 DIAGNOSIS — G250 Essential tremor: Secondary | ICD-10-CM

## 2014-06-30 DIAGNOSIS — R413 Other amnesia: Secondary | ICD-10-CM

## 2014-06-30 MED ORDER — PROPRANOLOL HCL 10 MG PO TABS: ORAL_TABLET | ORAL | Status: DC

## 2014-06-30 NOTE — Progress Notes (Signed)
Pre visit review using our clinic review tool, if applicable. No additional management support is needed unless otherwise documented below in the visit note. 

## 2014-06-30 NOTE — Progress Notes (Signed)
   Subjective:    Patient ID: Erasmo Scoreina Johnson Laurie, female    DOB: 04/12/1920, 80 y.o.   MRN: [id number]  HPI Coralee Northina is a 80 year old female who comes in today accompanied by her daughter,,,,,,,,,,,,, one daughter now lives with her full-time,,,,,,,,,,, because of her slowly progressing dementia. She is on Aricept and Namenda twice a day N/A  She is right-handed is having difficult writing check. She says sometimes her hand shakes. She is also having bruising and takes an aspirin tablet daily   Review of Systems Review of systems otherwise negative    Objective:   Physical Exam  Well-developed and nourished thin female no acute distress vital signs stable she's afebrile examination hands is normal when she grabs my tender and is not shake. She has one bruise on her right forearm      Assessment & Plan:  Benign tremor............ Inderal 10 mg each bedtime  Bruising...Marland Kitchen.Marland Kitchen. stop aspirin

## 2014-06-30 NOTE — Patient Instructions (Signed)
Inderal 10 mg...........Marland Kitchen. 1 at bedtime  Stop the aspirin if your bruising a lot  Return when necessary

## 2014-07-29 ENCOUNTER — Encounter: Payer: Self-pay | Admitting: Family Medicine

## 2014-08-19 ENCOUNTER — Encounter: Payer: Self-pay | Admitting: Family Medicine

## 2014-08-20 ENCOUNTER — Encounter: Payer: Self-pay | Admitting: Family Medicine

## 2014-08-20 ENCOUNTER — Ambulatory Visit: Payer: Medicare Other | Admitting: Family Medicine

## 2014-08-20 NOTE — Telephone Encounter (Signed)
Noted  

## 2014-08-25 ENCOUNTER — Telehealth: Payer: Self-pay | Admitting: Family Medicine

## 2014-08-25 NOTE — Telephone Encounter (Signed)
Form is ready for pick up and patient is aware.  Message will be sent to my chart.

## 2014-08-25 NOTE — Telephone Encounter (Signed)
[employer] needs handicap form completed for renewal please. Call when ready for pick up

## 2014-09-25 ENCOUNTER — Telehealth: Payer: Self-pay

## 2014-09-25 NOTE — Telephone Encounter (Signed)
Pt needs AWV  Pt canceled the 08/2014 appt, can it be rescheduled to include AWV?

## 2014-09-28 ENCOUNTER — Other Ambulatory Visit: Payer: Self-pay | Admitting: Neurology

## 2014-10-05 NOTE — Telephone Encounter (Signed)
Dr Tawanna Coolerodd does not do AWV.

## 2014-10-14 ENCOUNTER — Other Ambulatory Visit: Payer: Self-pay | Admitting: Internal Medicine

## 2014-11-26 ENCOUNTER — Encounter: Payer: Self-pay | Admitting: Neurology

## 2014-11-26 ENCOUNTER — Encounter: Payer: Self-pay | Admitting: Family Medicine

## 2014-11-26 ENCOUNTER — Ambulatory Visit (INDEPENDENT_AMBULATORY_CARE_PROVIDER_SITE_OTHER): Payer: Medicare Other | Admitting: Neurology

## 2014-11-26 VITALS — BP 110/59 | HR 68 | Ht 61.0 in | Wt 105.0 lb

## 2014-11-26 DIAGNOSIS — R413 Other amnesia: Secondary | ICD-10-CM | POA: Diagnosis not present

## 2014-11-26 DIAGNOSIS — G250 Essential tremor: Secondary | ICD-10-CM

## 2014-11-26 MED ORDER — MEMANTINE HCL 10 MG PO TABS: 10.0000 mg | ORAL_TABLET | Freq: Two times a day (BID) | ORAL | Status: DC

## 2014-11-26 MED ORDER — PROPRANOLOL HCL 10 MG PO TABS: ORAL_TABLET | ORAL | Status: DC

## 2014-11-26 MED ORDER — DONEPEZIL HCL 10 MG PO TABS: 10.0000 mg | ORAL_TABLET | Freq: Every day | ORAL | Status: DC

## 2014-11-26 NOTE — Progress Notes (Signed)
[facility]  PATIENT: Erasmo Scoreina Johnson Exline DOB: 03/21/1920   REASON FOR VISIT: Followup for memory loss    HISTORY OF PRESENT ILLNESS:Ms. Lequita HaltMorgan, 81 year old female returns for followup of memory trouble, primary care Dr. Tawanna Coolerodd  HISTORY: She has memory loss, visual hallucination She was previously healthy, living independently, had more than 12 years of education, has 6 children, retired as a Diplomatic Services operational officersecretary, still working part-time job, 2 days a week,. 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   Her son noticed that she has mild memory loss, she still lives independently, driving short distance, cooking, doing laundry all by herself, since February 2014, she began to have episode of visual hallucination, she sees people in her room, talking, came in through the window, sometimes very bothersome for her.  She denies gait difficulty, no loss sense of smells.  Most recent laboratory evaluation showed normal CBC, CMP, TSH, she is on B12 IM supplement MRI of brain 2014: Moderate atrophy, moderate small vessel disease  She is currently on Namenda 10 twice daily and Aricept 10mg  daily. She continues to live independently and is accompanied by her daughter today. No safety issues identified in the home. One fall in the last 6 months. The daughter feels memory is stable. She returns for reevaluation  UPDATE Jan 14th 2016: Her daughter lives with her most of time, she still drive short distance, independent, active member of [employer], she plays bingo   REVIEW OF SYSTEMS: Full 14 system review of systems performed and notable only for those listed, all others are neg:  Running nose   ALLERGIES: No Known Allergies  HOME MEDICATIONS: Outpatient Prescriptions Prior to Visit  Medication Sig Dispense Refill  . aspirin 81 MG tablet Take 81 mg by mouth daily.    . Calcium Carbonate-Vitamin D (CALCIUM PLUS VITAMIN D PO) Take by mouth.    . cyanocobalamin (,VITAMIN B-12,) 1000 MCG/ML injection  Inject 1 mL (1,000 mcg total) into the muscle every 30 (thirty) days. 30 mL 1  . donepezil (ARICEPT) 10 MG tablet Take 1 tablet (10 mg total) by mouth daily. 30 tablet 6  . ipratropium (ATROVENT) 0.06 % nasal spray PLACE 2 SPRAYS INTO BOTH NOSTRILS 3 (THREE) TIMES DAILY AS NEEDED FOR RHINITIS. 15 mL 2  . memantine (NAMENDA) 10 MG tablet TAKE 1 TABLET (10 MG TOTAL) BY MOUTH 2 (TWO) TIMES DAILY. 60 tablet 2  . methenamine (HIPREX) 1 G tablet Take 0.5 g by mouth every other day.     . propranolol (INDERAL) 10 MG tablet 1 by mouth each bedtime 100 tablet 3  . SYRINGE/NEEDLE, DISP, 1 ML (BD ECLIPSE SYRINGE) 25G X 5/8" 1 ML MISC 1 each by Does not apply route every 30 (thirty) days. 100 each 0   No facility-administered medications prior to visit.    PAST MEDICAL HISTORY: Past Medical History  Diagnosis Date  . Osteoporosis   . Mitral stenosis   . Migraine   . Hearing loss   . Pneumonia   . Cataract     bilateral  . UTI (urinary tract infection)   . Memory change     PAST SURGICAL HISTORY: Past Surgical History  Procedure Laterality Date  . Appendectomy    . Abdominal hysterectomy    . Bilateral salpingoophorectomy    . Eye surgery      bilateral  . Tubal ligation      FAMILY HISTORY: Family History  Problem Relation Age of Onset  . Heart Problems Mother   .  Heart Problems Father     SOCIAL HISTORY: History   Social History  . Marital Status: Widowed    Spouse Name: N/A    Number of Children: 6  . Years of Education: 12+   Occupational History  . Retired    Social History Main Topics  . Smoking status: Former Smoker -- 0.50 packs/day for 24 years    Types: Cigarettes    Quit date: 11/13/1965  . Smokeless tobacco: Never Used  . Alcohol Use: 0.6 oz/week    1 Glasses of wine per week     Comment: occ  . Drug Use: No  . Sexual Activity: Not on file   Other Topics Concern  . Not on file   Social History Narrative   Patient is widowed and lives alone.    Patient had six children and has 5 living children now.   Patient is retired.   Patient has a high school education and [employer].   Patient is right-handed.   Patient drinks 2-3 cups of coffee daily.     PHYSICAL EXAM  Filed Vitals:   11/26/14 1134  BP: 110/59  Pulse: 68  Height:  (1.549 m)  Weight: 105 lb (47.628 kg)   Body mass index is 19.85 kg/(m^2). Generalized: Well developed, in no acute distress  Head: normocephalic and atraumatic,. Oropharynx benign  Neck: Supple, no carotid bruits  Cardiac: Regular rate rhythm, no murmur  Musculoskeletal: No deformity  Neurological examination  Mentation: Alert oriented to time, place, history taking. MMSE 27 out of 30, she has missed 3 out of 3 recalls Cranial nerve II-XII: Pupils were equal round reactive to light extraocular movements were full, visual field were full on confrontational test. Facial sensation and strength were normal. Very HOH with bil hearing aids. Uvula tongue midline. head turning and shoulder shrug were normal and symmetric.Tongue protrusion into cheek strength was normal.  Motor: normal bulk and tone, full strength in the BUE, BLE, No focal weakness. Kyphosis  Coordination: finger-nose-finger, heel-to-shin bilaterally, no dysmetria  Reflexes: Brachioradialis 2/2, biceps 2/2, triceps 2/2, patellar 2/2, Achilles 2/2, plantar responses were flexor bilaterally.  Gait and Station: Rising up from seated position without assistance, wide based  stance, moderate stride, good arm swing, smooth turning, able to perform tiptoe, and heel walking without difficulty. No assistive device  DIAGNOSTIC DATA (LABS, IMAGING, TESTING) -  ASSESSMENT AND PLAN  81 y.o. year old female  has a past medical history of Osteoporosis; Mitral stenosis; Migraine;  and Memory change. here to followup for mild cognitive impairment   Refill her Aricept, Namenda Return to clinic in one year as needed  Levert Feinstein, M.D. Ph.D.  [facility] [address] [place], [place] [address] Phone: [phone] Fax:      [phone]

## 2014-11-26 NOTE — Patient Instructions (Signed)
Refilled medications,  Return to clinic in one year

## 2014-12-21 ENCOUNTER — Telehealth: Payer: Self-pay | Admitting: Family Medicine

## 2014-12-21 NOTE — Telephone Encounter (Signed)
Pt has chest congestion and wants to se dr todd tomorrow.  This has been going on >week. Ok to work in?

## 2014-12-21 NOTE — Telephone Encounter (Signed)
Please add patient to 12/22/14 at 8:15.  Patient is aware.

## 2014-12-22 ENCOUNTER — Ambulatory Visit (INDEPENDENT_AMBULATORY_CARE_PROVIDER_SITE_OTHER): Payer: Medicare Other | Admitting: Family Medicine

## 2014-12-22 ENCOUNTER — Encounter: Payer: Self-pay | Admitting: Family Medicine

## 2014-12-22 VITALS — BP 124/84 | HR 70 | Temp 97.9°F | Wt 107.0 lb

## 2014-12-22 DIAGNOSIS — J4489 Other specified chronic obstructive pulmonary disease: Secondary | ICD-10-CM

## 2014-12-22 DIAGNOSIS — J449 Chronic obstructive pulmonary disease, unspecified: Secondary | ICD-10-CM

## 2014-12-22 MED ORDER — CLARITHROMYCIN 500 MG PO TABS: 500.0000 mg | ORAL_TABLET | Freq: Two times a day (BID) | ORAL | Status: DC

## 2014-12-22 MED ORDER — PREDNISONE 20 MG PO TABS: ORAL_TABLET | ORAL | Status: DC

## 2014-12-22 MED ORDER — HYDROCODONE-HOMATROPINE 5-1.5 MG/5ML PO SYRP: 5.0000 mL | ORAL_SOLUTION | Freq: Three times a day (TID) | ORAL | Status: DC | PRN

## 2014-12-22 NOTE — Patient Instructions (Signed)
Rest at home  Drink lots of water  Biaxin............ one twice daily  Hydromet........Marland Kitchen. 1/2 teaspoon 3 times daily for cough  Prednisone 20 mg.......... 3 now........ 3 at bedtime tonight.......... then starting tomorrow morning 3 tablets every morning  Follow-up on Thursday

## 2014-12-22 NOTE — Progress Notes (Signed)
Pre visit review using our clinic review tool, if applicable. No additional management support is needed unless otherwise documented below in the visit note. 

## 2014-12-22 NOTE — Progress Notes (Signed)
   Subjective:    Patient ID: Erasmo Scoreina Johnson Brazier, female    DOB: 03/08/1920, 81 y.o.   MRN: [id number]  HPI Coralee Northina is a 81 year old female nonsmoker who comes in today with a cough for about 3 weeks  She's had a cold for couple weeks is gotten worse. She has no fever she has a lot of sputum production which is yellow. No blood. No shortness of breath. Pulse ox on room air 93%. She has a history of COPD and asthma   Review of Systems Review of systems otherwise negative    Objective:   Physical Exam  Well-developed well-nourished female no acute distress vital signs stable she's afebrile pulse ox on room air 93% HEENT were negative except cerumen impaction right ear neck was supple lungs are clear except for bilateral wheezing and rhonchi      Assessment & Plan:  Viral syndrome with secondary asthma, treat aggressively with antibiotics and prednisone because of underlying disease and age follow-up in 48 hours,

## 2014-12-24 ENCOUNTER — Ambulatory Visit (INDEPENDENT_AMBULATORY_CARE_PROVIDER_SITE_OTHER): Payer: Medicare Other | Admitting: Family Medicine

## 2014-12-24 ENCOUNTER — Encounter: Payer: Self-pay | Admitting: Family Medicine

## 2014-12-24 VITALS — BP 130/80 | HR 85 | Temp 97.3°F | Wt 108.0 lb

## 2014-12-24 DIAGNOSIS — J4489 Other specified chronic obstructive pulmonary disease: Secondary | ICD-10-CM

## 2014-12-24 DIAGNOSIS — J449 Chronic obstructive pulmonary disease, unspecified: Secondary | ICD-10-CM | POA: Diagnosis not present

## 2014-12-24 NOTE — Patient Instructions (Signed)
Prednisone 20 mg......... starting tomorrow 2 tabs 5 days.......Marland Kitchen. 1 tab 5 days...... a half a tab 5 days..... Then a half a tab Monday Wednesday Friday for a two-week taper  Return to see me in 3 weeks for follow-up  Hydromet........Marland Kitchen. 1/2-1 teaspoon at bedtime when necessary  Finished the antibiotics

## 2014-12-24 NOTE — Progress Notes (Signed)
   Subjective:    Patient ID: Erasmo Scoreina Johnson Ringstad, female    DOB: 07/12/1920, 81 y.o.   MRN: [id number]  HPI Coralee Northina is a 81 year old widowed female.... 2...Marland Kitchen.Marland Kitchen.Marland Kitchen. who comes in today accompanied by her daughter for follow-up of asthma  She had a viral syndrome the triggered her COPD and wheezing. We saw her the other day and started on prednisone 60 mg daily, Biaxin 500 twice a day and cough syrup. She comes back today stating he feels much much better. No side effects from the medication.   Review of Systems    Review of systems otherwise negative  Objective:   Physical Exam  Well-developed well-nourished female no acute distress vital signs stable she's afebrile pulmonary exam shows a small chest COPD type configuration decreased breath sounds wheezing markedly diminished since the other day but still present      Assessment & Plan:COPD with flare. COPD COPD with flare ............. taper prednisone as outlined return when necessary.

## 2015-01-14 ENCOUNTER — Ambulatory Visit (INDEPENDENT_AMBULATORY_CARE_PROVIDER_SITE_OTHER): Payer: Medicare Other | Admitting: Family Medicine

## 2015-01-14 ENCOUNTER — Encounter: Payer: Self-pay | Admitting: Family Medicine

## 2015-01-14 VITALS — BP 102/78 | Temp 97.8°F | Wt 104.0 lb

## 2015-01-14 DIAGNOSIS — N3000 Acute cystitis without hematuria: Secondary | ICD-10-CM | POA: Diagnosis not present

## 2015-01-14 DIAGNOSIS — J449 Chronic obstructive pulmonary disease, unspecified: Secondary | ICD-10-CM | POA: Diagnosis not present

## 2015-01-14 DIAGNOSIS — R300 Dysuria: Secondary | ICD-10-CM

## 2015-01-14 DIAGNOSIS — J4489 Other specified chronic obstructive pulmonary disease: Secondary | ICD-10-CM

## 2015-01-14 LAB — POCT URINALYSIS DIPSTICK
BILIRUBIN UA: NEGATIVE
Glucose, UA: NEGATIVE
Ketones, UA: NEGATIVE
NITRITE UA: NEGATIVE
PH UA: 6
Protein, UA: NEGATIVE
Spec Grav, UA: 1.01
UROBILINOGEN UA: 0.2

## 2015-01-14 MED ORDER — SULFAMETHOXAZOLE-TRIMETHOPRIM 800-160 MG PO TABS: ORAL_TABLET | ORAL | Status: DC

## 2015-01-14 MED ORDER — PREDNISONE 10 MG PO TABS: 10.0000 mg | ORAL_TABLET | Freq: Every day | ORAL | Status: DC

## 2015-01-14 NOTE — Progress Notes (Signed)
Pre visit review using our clinic review tool, if applicable. No additional management support is needed unless otherwise documented below in the visit note. 

## 2015-01-14 NOTE — Patient Instructions (Signed)
Prednisone 10 mg.......Marland Kitchen. 1 tab daily for 3 weeks then decrease it to 1 tab Monday Wednesday Friday  Follow-up mid April  Septra DS,,,, one twice daily for 7 days,,,,,,,,,,,, then 1 at bedtime until bottle empty,

## 2015-01-14 NOTE — Progress Notes (Signed)
   Subjective:    Patient ID: Lori [name], female    DOB: 12/24/1919, 81 y.o.   MRN: [id number]  HPI Lori [name] is a 81 year old female widowed 2 who comes in today accompanied by her daughter for evaluation of 2 problems  We have worked with her over the past 6 weeks. She had a flareup in her COPD developed asthma with a secondary pneumonia. She was treated as an outpatient and did well. She took her last 10 mg prednisone dose which she had been on a taper every other day about 4 days ago. She says she feels fine except for little wheezy.  Yesterday she developed urinary tract symptoms of dysuria and frequency. No fever chills or back pain. Last urine tract infection was about a year ago   Review of Systems Review of systems otherwise negative    Objective:   Physical Exam  Well-developed well-nourished female no acute distress vital signs stable she's afebrile examination lungs shows decreased breath sounds. Mild wheezing with forced expiration      Assessment & Plan:  Underlying COPD with asthma resolving slowly.......... discussed options.......Marland Kitchen. oral prednisone versus inhalers.......Marland Kitchen. elect for the oral prednisone  Unit tract infection.... Begin Septra... Urine culture.... Follow-up in culture returns

## 2015-01-16 LAB — URINE CULTURE: Colony Count: >=100000

## 2015-01-20 ENCOUNTER — Telehealth: Payer: Self-pay | Admitting: Family Medicine

## 2015-01-20 NOTE — Telephone Encounter (Signed)
Pt daughter called and would like a call back about culture results

## 2015-01-21 ENCOUNTER — Encounter: Payer: Self-pay | Admitting: Family Medicine

## 2015-01-21 DIAGNOSIS — N3020 Other chronic cystitis without hematuria: Secondary | ICD-10-CM | POA: Diagnosis not present

## 2015-01-21 DIAGNOSIS — N952 Postmenopausal atrophic vaginitis: Secondary | ICD-10-CM | POA: Diagnosis not present

## 2015-01-21 DIAGNOSIS — N390 Urinary tract infection, site not specified: Secondary | ICD-10-CM | POA: Diagnosis not present

## 2015-01-21 DIAGNOSIS — R339 Retention of urine, unspecified: Secondary | ICD-10-CM | POA: Diagnosis not present

## 2015-01-21 DIAGNOSIS — R350 Frequency of micturition: Secondary | ICD-10-CM | POA: Diagnosis not present

## 2015-01-21 NOTE — Telephone Encounter (Signed)
Left message on machine for daughter to continue medication per Dr Tawanna Coolerodd

## 2015-02-01 ENCOUNTER — Encounter: Payer: Self-pay | Admitting: Nurse Practitioner

## 2015-02-01 ENCOUNTER — Encounter: Payer: Self-pay | Admitting: Neurology

## 2015-02-02 ENCOUNTER — Encounter: Payer: Self-pay | Admitting: Nurse Practitioner

## 2015-02-02 NOTE — Telephone Encounter (Signed)
1 year Rx was sent in Jan.  I called the pharmacy.  Spoke with Maralyn SagoSarah.  She verified they do have this Rx on file, and can fill it today.  Says the patient is likely looking at an old Rx number.  I called the patient back.  Got no answer.  Left message.

## 2015-02-25 ENCOUNTER — Ambulatory Visit (INDEPENDENT_AMBULATORY_CARE_PROVIDER_SITE_OTHER): Payer: Medicare Other | Admitting: Family Medicine

## 2015-02-25 ENCOUNTER — Encounter: Payer: Self-pay | Admitting: Family Medicine

## 2015-02-25 VITALS — BP 100/50 | HR 72 | Temp 97.3°F | Wt 104.9 lb

## 2015-02-25 DIAGNOSIS — J449 Chronic obstructive pulmonary disease, unspecified: Secondary | ICD-10-CM

## 2015-02-25 NOTE — Progress Notes (Signed)
   Subjective:    Patient ID: Lori [name], female    DOB: 03/17/1920, 81 y.o.   MRN: [id number]  HPI Lori [name] is a 81 year old single female,,,,,,,, married 2 both husbands have died,,,,, who comes in today accompanied by her daughter for follow-up of COPD and asthma  She [place] viral infection [place]. We treated as an outpatient. We were fortunate not to have to admit her. She amazingly did well and is tapered off her prednisone over the last 6 weeks. Her last 10 mg of prednisone dose was 2 weeks ago. She says she feels well no recurrence of her cough or shortness of breath or wheezing  Her in a tract infection is treated by [facility] she self-referred to urology.   Review of Systems Review of systems otherwise negative    Objective:   Physical Exam Well-developed developed well-nourished female no acute distress vital signs stable she's afebrile examination lungs shows a small chest wall breath sounds diminished but no wheezing       Assessment & Plan:  COPD stable no medication at this time follow-up when necessary  Urinary tract infection followed by urology

## 2015-02-25 NOTE — Progress Notes (Signed)
Pre visit review using our clinic review tool, if applicable. No additional management support is needed unless otherwise documented below in the visit note. 

## 2015-02-25 NOTE — Patient Instructions (Signed)
Continue current medications  Flu shot in the fall  Follow-up in January 2017

## 2015-04-27 DIAGNOSIS — R339 Retention of urine, unspecified: Secondary | ICD-10-CM | POA: Diagnosis not present

## 2015-06-16 DIAGNOSIS — H11442 Conjunctival cysts, left eye: Secondary | ICD-10-CM | POA: Diagnosis not present

## 2015-06-16 DIAGNOSIS — M316 Other giant cell arteritis: Secondary | ICD-10-CM | POA: Diagnosis not present

## 2015-06-17 DIAGNOSIS — M316 Other giant cell arteritis: Secondary | ICD-10-CM | POA: Diagnosis not present

## 2015-06-28 ENCOUNTER — Encounter (HOSPITAL_COMMUNITY): Payer: Self-pay | Admitting: *Deleted

## 2015-06-28 ENCOUNTER — Emergency Department (INDEPENDENT_AMBULATORY_CARE_PROVIDER_SITE_OTHER)
Admission: EM | Admit: 2015-06-28 | Discharge: 2015-06-28 | Disposition: A | Discharge status: Home / Self Care | Payer: Medicare Other | Source: Home / Self Care | Attending: Family Medicine | Admitting: Family Medicine

## 2015-06-28 DIAGNOSIS — N390 Urinary tract infection, site not specified: Secondary | ICD-10-CM

## 2015-06-28 LAB — POCT URINALYSIS DIP (DEVICE)
Bilirubin Urine: NEGATIVE
Glucose, UA: NEGATIVE mg/dL
KETONES UR: 15 mg/dL — AB
Nitrite: NEGATIVE
PH: 7 (ref 5.0–8.0)
SPECIFIC GRAVITY, URINE: 1.025 (ref 1.005–1.030)
UROBILINOGEN UA: 0.2 mg/dL (ref 0.0–1.0)

## 2015-06-28 MED ORDER — CEPHALEXIN 500 MG PO CAPS: 500.0000 mg | ORAL_CAPSULE | Freq: Four times a day (QID) | ORAL | Status: DC

## 2015-06-28 NOTE — ED Provider Notes (Signed)
CSN: [id number]     Arrival date & time 06/28/15  1307 History   First MD Initiated Contact with Patient 06/28/15 1336     Chief Complaint  Patient presents with  . Urinary Tract Infection   (Consider location/radiation/quality/duration/timing/severity/associated sxs/prior Treatment) Patient is a 81 y.o. female presenting with urinary tract infection. The history is provided by the patient and a caregiver.  Urinary Tract Infection Pain quality:  Burning Pain severity:  Mild Onset quality:  Gradual Duration:  5 days Progression:  Unchanged Chronicity:  Chronic Recent urinary tract infections: yes   Relieved by:  None tried Worsened by:  Nothing tried Urinary symptoms: frequent urination, hesitancy and incontinence   Associated symptoms: no fever, no flank pain, no nausea and no vomiting   Risk factors: recurrent urinary tract infections     Past Medical History  Diagnosis Date  . Osteoporosis   . Mitral stenosis   . Migraine   . Hearing loss   . Pneumonia   . Cataract     bilateral  . UTI (urinary tract infection)   . Memory change    Past Surgical History  Procedure Laterality Date  . Appendectomy    . Abdominal hysterectomy    . Bilateral salpingoophorectomy    . Eye surgery      bilateral  . Tubal ligation     Family History  Problem Relation Age of Onset  . Heart Problems Mother   . Heart Problems Father    Social History  Substance Use Topics  . Smoking status: Former Smoker -- 0.50 packs/day for 24 years    Types: Cigarettes    Quit date: 11/13/1965  . Smokeless tobacco: Never Used  . Alcohol Use: 0.6 oz/week    1 Glasses of wine per week     Comment: occ   OB History    No data available     Review of Systems  Constitutional: Negative.  Negative for fever.  Gastrointestinal: Negative.  Negative for nausea and vomiting.  Genitourinary: Positive for dysuria, urgency and frequency. Negative for hematuria, flank pain and menstrual problem.     Allergies  Review of patient's allergies indicates no known allergies.  Home Medications   Prior to Admission medications   Medication Sig Start Date End Date Taking? Authorizing Provider  aspirin 81 MG tablet Take 81 mg by mouth daily.    Historical Provider, MD  Calcium Carbonate-Vitamin D (CALCIUM PLUS VITAMIN D PO) Take by mouth.    Historical Provider, MD  cephALEXin (KEFLEX) 500 MG capsule Take 1 capsule (500 mg total) by mouth 4 (four) times daily. Take all of medicine and drink lots of fluids 06/28/15   Linna Hoff, MD  cyanocobalamin (,VITAMIN B-12,) 1000 MCG/ML injection Inject 1 mL (1,000 mcg total) into the muscle every 30 (thirty) days. 01/08/13   Roderick Pee, MD  donepezil (ARICEPT) 10 MG tablet Take 1 tablet (10 mg total) by mouth daily. 11/26/14   Levert Feinstein, MD  memantine (NAMENDA) 10 MG tablet Take 1 tablet (10 mg total) by mouth 2 (two) times daily. 11/26/14   Levert Feinstein, MD  methenamine (HIPREX) 1 G tablet Take 0.5 g by mouth every other day.  04/09/13   Historical Provider, MD  propranolol (INDERAL) 10 MG tablet 1 by mouth each bedtime 11/26/14   Roderick Pee, MD  SYRINGE/NEEDLE, DISP, 1 ML (BD ECLIPSE SYRINGE) 25G X 5/8" 1 ML MISC 1 each by Does not apply route every 30 (thirty) days.  06/27/13   Roderick Pee, MD   BP 121/95 mmHg  Pulse 74  Temp(Src) 98.3 F (36.8 C) (Oral)  Resp 18  SpO2 93% Physical Exam  Constitutional: She is oriented to person, place, and time. She appears well-developed and well-nourished. No distress.  Abdominal: Soft. Bowel sounds are normal. She exhibits no distension and no mass. There is no tenderness. There is no rebound and no guarding.  Neurological: She is alert and oriented to person, place, and time.  Skin: Skin is warm and dry.  Nursing note and vitals reviewed.   ED Course  Procedures (including critical care time) Labs Review Labs Reviewed  POCT URINALYSIS DIP (DEVICE) - Abnormal; Notable for the following:     Ketones, ur 15 (*)    Hgb urine dipstick MODERATE (*)    Protein, ur >=300 (*)    Leukocytes, UA LARGE (*)    All other components within normal limits   U/a abnl Imaging Review No results found.   MDM   1. UTI (lower urinary tract infection)        Linna Hoff, MD 06/28/15 1426

## 2015-06-28 NOTE — Discharge Instructions (Signed)
Take all of medicine as directed, drink lots of fluids, see your doctor if further problems. °

## 2015-06-28 NOTE — ED Notes (Signed)
Pt    Reports  Symptoms  Of   Dysuria   With  Burning  When  She  Urinates           With  Onset  Of  Symptoms  For  Several  Days            Scanty  Output      Noticed

## 2015-07-21 DIAGNOSIS — M316 Other giant cell arteritis: Secondary | ICD-10-CM | POA: Diagnosis not present

## 2015-07-26 ENCOUNTER — Encounter: Payer: Self-pay | Admitting: Family Medicine

## 2015-08-10 ENCOUNTER — Encounter: Payer: Self-pay | Admitting: Adult Health

## 2015-08-10 ENCOUNTER — Ambulatory Visit (INDEPENDENT_AMBULATORY_CARE_PROVIDER_SITE_OTHER): Payer: Medicare Other | Admitting: Adult Health

## 2015-08-10 VITALS — BP 130/70 | HR 57 | Temp 97.4°F | Ht 61.0 in | Wt 99.8 lb

## 2015-08-10 DIAGNOSIS — R51 Headache: Secondary | ICD-10-CM

## 2015-08-10 DIAGNOSIS — W19XXXA Unspecified fall, initial encounter: Secondary | ICD-10-CM | POA: Diagnosis not present

## 2015-08-10 NOTE — Patient Instructions (Signed)
It was a pleasure meeting you today  -Stop taking your Inderal as it lowers your blood pressure and heart rate and this may have been why you fell down.   Follow up in 10 days for a recheck of your blood pressure.   Go to the ER with any changes in mental status or vision

## 2015-08-10 NOTE — Progress Notes (Signed)
Pre visit review using our clinic review tool, if applicable. No additional management support is needed unless otherwise documented below in the visit note. 

## 2015-08-10 NOTE — Progress Notes (Signed)
Subjective:    Patient ID: Lori [name], female    DOB: 05/25/1920, 81 y.o.   MRN: [id number]  HPI  81 year old female who presents to the office today after sustaining a fall on Friday(4 days ago). She endorses hitting they back of her head on a carpeted floor and had no LOC.She thinks that she became dizzy and fell backwards, does remember hitting her head.  Did not have LOC today in the office she reports that she has no pain in the back of the head or dizziness today. Is alert and oriented and family has not noticed any abnormalities.   She is not on blood thinners  BP in the office today is 130/70 and pulse of 57. She takes a beta blocker for her blood pressure.   Review of Systems  Constitutional: Negative.   Eyes: Negative.   Respiratory: Negative.   Cardiovascular: Negative.   Musculoskeletal: Negative.   Neurological: Negative.   All other systems reviewed and are negative.  Past Medical History  Diagnosis Date  . Osteoporosis   . Mitral stenosis   . Migraine   . Hearing loss   . Pneumonia   . Cataract     bilateral  . UTI (urinary tract infection)   . Memory change     Social History   Social History  . Marital Status: Widowed    Spouse Name: N/A  . Number of Children: 6  . Years of Education: 12+   Occupational History  . Retired    Social History Main Topics  . Smoking status: Former Smoker -- 0.50 packs/day for 24 years    Types: Cigarettes    Quit date: 11/13/1965  . Smokeless tobacco: Never Used  . Alcohol Use: 0.6 oz/week    1 Glasses of wine per week     Comment: occ  . Drug Use: No  . Sexual Activity: Not on file   Other Topics Concern  . Not on file   Social History Narrative   Patient is widowed and lives alone.   Patient had six children and has 5 living children now.   Patient is retired.   Patient has a high school education and [employer].   Patient is right-handed.   Patient drinks 2-3 cups of coffee daily.    Past  Surgical History  Procedure Laterality Date  . Appendectomy    . Abdominal hysterectomy    . Bilateral salpingoophorectomy    . Eye surgery      bilateral  . Tubal ligation      Family History  Problem Relation Age of Onset  . Heart Problems Mother   . Heart Problems Father     No Known Allergies  Current Outpatient Prescriptions on File Prior to Visit  Medication Sig Dispense Refill  . Calcium Carbonate-Vitamin D (CALCIUM PLUS VITAMIN D PO) Take by mouth.    . cyanocobalamin (,VITAMIN B-12,) 1000 MCG/ML injection Inject 1 mL (1,000 mcg total) into the muscle every 30 (thirty) days. 30 mL 1  . donepezil (ARICEPT) 10 MG tablet Take 1 tablet (10 mg total) by mouth daily. 30 tablet 11  . memantine (NAMENDA) 10 MG tablet Take 1 tablet (10 mg total) by mouth 2 (two) times daily. 60 tablet 11  . methenamine (HIPREX) 1 G tablet Take 0.5 g by mouth every other day.     . propranolol (INDERAL) 10 MG tablet 1 by mouth each bedtime 100 tablet 3  . SYRINGE/NEEDLE, DISP,  1 ML (BD ECLIPSE SYRINGE) 25G X 5/8" 1 ML MISC 1 each by Does not apply route every 30 (thirty) days. 100 each 0   No current facility-administered medications on file prior to visit.    BP 130/70 mmHg  Pulse 57  Temp(Src) 97.4 F (36.3 C) (Oral)  Ht  (1.549 m)  Wt 99 lb 12.8 oz (45.269 kg)  BMI 18.87 kg/m2  SpO2 93%       Objective:   Physical Exam  Constitutional: She is oriented to person, place, and time. She appears well-developed and well-nourished. No distress.  Eyes: Right eye exhibits no discharge. Left eye exhibits no discharge.  Neck: Normal range of motion. Neck supple.  Cardiovascular: Normal rate, regular rhythm, normal heart sounds and intact distal pulses.  Exam reveals no gallop and no friction rub.   No murmur heard. Pulmonary/Chest: Effort normal and breath sounds normal. No respiratory distress. She has no wheezes. She has no rales. She exhibits no tenderness.  Lymphadenopathy:    She  has no cervical adenopathy.  Neurological: She is alert and oriented to person, place, and time.  Skin: Skin is warm and dry. No rash noted. She is not diaphoretic. No erythema. No pallor.  Psychiatric: She has a normal mood and affect. Her behavior is normal. Judgment and thought content normal.  Nursing note and vitals reviewed.      Assessment & Plan:  1. Fall, initial encounter - Fall may have been from too low of BP or pulse - D/C Inderal  - Follow up in 7-10 days. - Stay hydrated

## 2015-08-19 ENCOUNTER — Ambulatory Visit (INDEPENDENT_AMBULATORY_CARE_PROVIDER_SITE_OTHER): Payer: Medicare Other | Admitting: Adult Health

## 2015-08-19 ENCOUNTER — Encounter: Payer: Self-pay | Admitting: Adult Health

## 2015-08-19 VITALS — Temp 97.8°F | Ht 61.0 in | Wt 99.3 lb

## 2015-08-19 DIAGNOSIS — R1032 Left lower quadrant pain: Secondary | ICD-10-CM | POA: Diagnosis not present

## 2015-08-19 DIAGNOSIS — R42 Dizziness and giddiness: Secondary | ICD-10-CM | POA: Diagnosis not present

## 2015-08-19 MED ORDER — AMOXICILLIN-POT CLAVULANATE 875-125 MG PO TABS: 1.0000 | ORAL_TABLET | Freq: Two times a day (BID) | ORAL | Status: DC

## 2015-08-19 NOTE — Patient Instructions (Addendum)
It was great seeing you today  Drink plenty of fluids!  Take the abx Augmentin twice a day for 7 days. If you do not notice any improvement in the next 3 days, please let me know. Eat a clear liquid diet for the next three days.   Diverticulitis Diverticulitis is inflammation or infection of small pouches in your colon that form when you have a condition called diverticulosis. The pouches in your colon are called diverticula. Your colon, or large intestine, is where water is absorbed and stool is formed. Complications of diverticulitis can include:  Bleeding.  Severe infection.  Severe pain.  Perforation of your colon.  Obstruction of your colon. CAUSES  Diverticulitis is caused by bacteria. Diverticulitis happens when stool becomes trapped in diverticula. This allows bacteria to grow in the diverticula, which can lead to inflammation and infection. RISK FACTORS People with diverticulosis are at risk for diverticulitis. Eating a diet that does not include enough fiber from fruits and vegetables may make diverticulitis more likely to develop. SYMPTOMS  Symptoms of diverticulitis may include:  Abdominal pain and tenderness. The pain is normally located on the left side of the abdomen, but may occur in other areas.  Fever and chills.  Bloating.  Cramping.  Nausea.  Vomiting.  Constipation.  Diarrhea.  Blood in your stool. DIAGNOSIS  Your health care provider will ask you about your medical history and do a physical exam. You may need to have tests done because many medical conditions can cause the same symptoms as diverticulitis. Tests may include:  Blood tests.  Urine tests.  Imaging tests of the abdomen, including X-rays and CT scans. When your condition is under control, your health care provider may recommend that you have a colonoscopy. A colonoscopy can show how severe your diverticula are and whether something else is causing your symptoms. TREATMENT  Most  cases of diverticulitis are mild and can be treated at home. Treatment may include:  Taking over-the-counter pain medicines.  Following a clear liquid diet.  Taking antibiotic medicines by mouth for 7-10 days. More severe cases may be treated at a hospital. Treatment may include:  Not eating or drinking.  Taking prescription pain medicine.  Receiving antibiotic medicines through an IV tube.  Receiving fluids and nutrition through an IV tube.  Surgery. HOME CARE INSTRUCTIONS   Follow your health care provider's instructions carefully.  Follow a full liquid diet or other diet as directed by your health care provider. After your symptoms improve, your health care provider may tell you to change your diet. He or she may recommend you eat a high-fiber diet. Fruits and vegetables are good sources of fiber. Fiber makes it easier to pass stool.  Take fiber supplements or probiotics as directed by your health care provider.  Only take medicines as directed by your health care provider.  Keep all your follow-up appointments. SEEK MEDICAL CARE IF:   Your pain does not improve.  You have a hard time eating food.  Your bowel movements do not return to normal. SEEK IMMEDIATE MEDICAL CARE IF:   Your pain becomes worse.  Your symptoms do not get better.  Your symptoms suddenly get worse.  You have a fever.  You have repeated vomiting.  You have bloody or black, tarry stools. MAKE SURE YOU:   Understand these instructions.  Will watch your condition.  Will get help right away if you are not doing well or get worse.   This information is not intended to  replace advice given to you by your health care provider. Make sure you discuss any questions you have with your health care provider.   Document Released: 08/09/2005 Document Revised: 11/04/2013 Document Reviewed: 09/24/2013 Elsevier Interactive Patient Education [employer]

## 2015-08-19 NOTE — Progress Notes (Signed)
Pre visit review using our clinic review tool, if applicable. No additional management support is needed unless otherwise documented below in the visit note. 

## 2015-08-19 NOTE — Progress Notes (Signed)
Subjective:    Patient ID: Lori [name], female    DOB: Dec 29, 1919, 81 y.o.   MRN: [id number]  HPI  81 year old female, patient of Dr. Tawanna Cooler, who is in the office for follow up of dizziness. I last saw her on 08/10/2015. At that time she was being seen in the office after falling backwards. It was thought that low blood pressure may be contributing to her dizziness and subsequent fall. Inderal 10 mg was discontinued.   She continues to complain of dizziness on occasion but feels like she is feeling better. Her BP in the office is 105/80. She does not drink a lot of fluids.   She does have lower abdominal pain for about 10 days. Pain is worse when palpating. She denies any constipation or diarrhea. Cannot describe the pain. Pain is always there. Her daughter has noticed that she has been holding her stomach lately. Does have a history of Diverticulitis but last flare was " a long time ago". She does not have any kidney stones.     Review of Systems  Gastrointestinal: Positive for abdominal pain. Negative for nausea, vomiting, diarrhea, constipation and abdominal distention.  Genitourinary: Negative.   Neurological: Positive for dizziness. Negative for weakness and headaches.  All other systems reviewed and are negative.  Past Medical History  Diagnosis Date  . Osteoporosis   . Mitral stenosis   . Migraine   . Hearing loss   . Pneumonia   . Cataract     bilateral  . UTI (urinary tract infection)   . Memory change     Social History   Social History  . Marital Status: Widowed    Spouse Name: N/A  . Number of Children: 6  . Years of Education: 12+   Occupational History  . Retired    Social History Main Topics  . Smoking status: Former Smoker -- 0.50 packs/day for 24 years    Types: Cigarettes    Quit date: 11/13/1965  . Smokeless tobacco: Never Used  . Alcohol Use: 0.6 oz/week    1 Glasses of wine per week     Comment: occ  . Drug Use: No  . Sexual Activity:  Not on file   Other Topics Concern  . Not on file   Social History Narrative   Patient is widowed and lives alone.   Patient had six children and has 5 living children now.   Patient is retired.   Patient has a high school education and [employer].   Patient is right-handed.   Patient drinks 2-3 cups of coffee daily.    Past Surgical History  Procedure Laterality Date  . Appendectomy    . Abdominal hysterectomy    . Bilateral salpingoophorectomy    . Eye surgery      bilateral  . Tubal ligation      Family History  Problem Relation Age of Onset  . Heart Problems Mother   . Heart Problems Father     No Known Allergies  Current Outpatient Prescriptions on File Prior to Visit  Medication Sig Dispense Refill  . Calcium Carbonate-Vitamin D (CALCIUM PLUS VITAMIN D PO) Take by mouth.    . cyanocobalamin (,VITAMIN B-12,) 1000 MCG/ML injection Inject 1 mL (1,000 mcg total) into the muscle every 30 (thirty) days. 30 mL 1  . donepezil (ARICEPT) 10 MG tablet Take 1 tablet (10 mg total) by mouth daily. 30 tablet 11  . memantine (NAMENDA) 10 MG tablet Take 1  tablet (10 mg total) by mouth 2 (two) times daily. 60 tablet 11  . methenamine (HIPREX) 1 G tablet Take 0.5 g by mouth every other day.     . SYRINGE/NEEDLE, DISP, 1 ML (BD ECLIPSE SYRINGE) 25G X 5/8" 1 ML MISC 1 each by Does not apply route every 30 (thirty) days. 100 each 0   No current facility-administered medications on file prior to visit.    Temp(Src) 97.8 F (36.6 C) (Oral)  Ht  (1.549 m)  Wt 99 lb 4.8 oz (45.042 kg)  BMI 18.77 kg/m2       Objective:   Physical Exam  Constitutional: She is oriented to person, place, and time. She appears well-developed and well-nourished. No distress.  Cardiovascular: Normal rate, regular rhythm, normal heart sounds and intact distal pulses.  Exam reveals no gallop and no friction rub.   No murmur heard. Pulmonary/Chest: Effort normal and breath sounds normal. No  respiratory distress. She has no wheezes. She has no rales. She exhibits no tenderness.  Abdominal: Soft. Bowel sounds are normal. She exhibits no distension and no mass. There is tenderness (left upper and lower quadrants). There is no rebound and no guarding.  No bruising or signs of trauma.   Musculoskeletal: Normal range of motion. She exhibits no edema or tenderness.  Walks with walker. Has steady gait  Neurological: She is alert and oriented to person, place, and time.  Skin: Skin is warm and dry. No rash noted. She is not diaphoretic. No erythema. No pallor.  Psychiatric: She has a normal mood and affect. Her behavior is normal. Thought content normal.  Nursing note and vitals reviewed.     Assessment & Plan:  1. Dizziness - BP continues to be low in the office 105/80 despite d/c Inderal - Needs to increase fluid intake  - Dizziness has been getting better but still may be a result of Aricept or Nemanda - Continue to use walker at all times.   2. Abdominal pain, left lower quadrant - Personal history of diverticulitis, unsure of last flare but was years ago. Will treat as diverticulitis.  - amoxicillin-clavulanate (AUGMENTIN) 875-125 MG tablet; Take 1 tablet by mouth 2 (two) times daily.  Dispense: 14 tablet; Refill: 0 - Clear liquid diet for 3-5 days - Follow up if no improvement in the next 2-3 days.

## 2015-08-24 DIAGNOSIS — M316 Other giant cell arteritis: Secondary | ICD-10-CM | POA: Diagnosis not present

## 2015-09-07 ENCOUNTER — Encounter: Payer: Self-pay | Admitting: Family Medicine

## 2015-09-14 ENCOUNTER — Ambulatory Visit (INDEPENDENT_AMBULATORY_CARE_PROVIDER_SITE_OTHER): Payer: Medicare Other | Admitting: *Deleted

## 2015-09-14 DIAGNOSIS — Z0000 Encounter for general adult medical examination without abnormal findings: Secondary | ICD-10-CM

## 2015-09-15 ENCOUNTER — Encounter: Payer: Self-pay | Admitting: Family Medicine

## 2015-09-16 ENCOUNTER — Ambulatory Visit (INDEPENDENT_AMBULATORY_CARE_PROVIDER_SITE_OTHER): Payer: Medicare Other | Admitting: *Deleted

## 2015-09-16 DIAGNOSIS — Z23 Encounter for immunization: Secondary | ICD-10-CM

## 2015-09-16 LAB — TB SKIN TEST: TB Skin Test: NEGATIVE

## 2015-10-04 ENCOUNTER — Encounter: Payer: Self-pay | Admitting: Family Medicine

## 2015-10-04 DIAGNOSIS — E538 Deficiency of other specified B group vitamins: Secondary | ICD-10-CM

## 2015-10-04 MED ORDER — "SYRINGE/NEEDLE (DISP) 25G X 5/8"" 1 ML MISC": 1.0000 | Status: DC

## 2015-10-04 MED ORDER — CYANOCOBALAMIN 1000 MCG/ML IJ SOLN: 1000.0000 ug | INTRAMUSCULAR | Status: DC

## 2015-10-23 DIAGNOSIS — N390 Urinary tract infection, site not specified: Secondary | ICD-10-CM | POA: Diagnosis not present

## 2015-10-25 ENCOUNTER — Encounter: Payer: Self-pay | Admitting: Family Medicine

## 2015-10-25 DIAGNOSIS — N390 Urinary tract infection, site not specified: Secondary | ICD-10-CM | POA: Diagnosis not present

## 2015-10-27 DIAGNOSIS — H47012 Ischemic optic neuropathy, left eye: Secondary | ICD-10-CM | POA: Diagnosis not present

## 2016-03-14 DIAGNOSIS — L820 Inflamed seborrheic keratosis: Secondary | ICD-10-CM | POA: Diagnosis not present

## 2016-03-14 DIAGNOSIS — L309 Dermatitis, unspecified: Secondary | ICD-10-CM | POA: Diagnosis not present

## 2016-03-20 ENCOUNTER — Telehealth: Payer: Self-pay | Admitting: Family Medicine

## 2016-03-20 NOTE — Telephone Encounter (Signed)
Pt daughter thinks pt has UTI and [facility] needs a order to test the pt.

## 2016-03-21 NOTE — Telephone Encounter (Signed)
Spoke with daughter and an appointment made. 

## 2016-03-21 NOTE — Telephone Encounter (Signed)
D[facility]  Pt is getting more and more confused. Would like to know if you have received the fax and if Dr Tawanna Coolerodd will order.

## 2016-03-22 ENCOUNTER — Ambulatory Visit (INDEPENDENT_AMBULATORY_CARE_PROVIDER_SITE_OTHER): Payer: Medicare Other | Admitting: Adult Health

## 2016-03-22 ENCOUNTER — Encounter: Payer: Self-pay | Admitting: Adult Health

## 2016-03-22 VITALS — BP 110/52 | Temp 98.9°F | Wt 106.9 lb

## 2016-03-22 DIAGNOSIS — N3000 Acute cystitis without hematuria: Secondary | ICD-10-CM

## 2016-03-22 LAB — POCT URINALYSIS DIPSTICK
BILIRUBIN UA: NEGATIVE
Glucose, UA: NEGATIVE
KETONES UA: NEGATIVE
NITRITE UA: NEGATIVE
PH UA: 6
PROTEIN UA: NEGATIVE
Spec Grav, UA: 1.025
Urobilinogen, UA: 0.2

## 2016-03-22 MED ORDER — SULFAMETHOXAZOLE-TRIMETHOPRIM 800-160 MG PO TABS: 1.0000 | ORAL_TABLET | Freq: Two times a day (BID) | ORAL | Status: DC

## 2016-03-22 NOTE — Progress Notes (Signed)
  WUJ:WJXB,[id number]:TODD,JEFFREY ALLEN, MD No chief complaint on file.   Current Issues:  Presents with 3 days of Confusion and hallucinations  Family reports that two days ago she had an episode of confusion where she thought she saw someone in her room. The confusion has since resolved. She denies any other urinary symptoms  Associated symptoms include:  None  There is a previous history of UTI with  similar symptoms.   Prior to Admission medications   Medication Sig Start Date End Date Taking? Authorizing Provider  Calcium Carbonate-Vitamin D (CALCIUM PLUS VITAMIN D PO) Take by mouth.   Yes Historical Provider, MD  cyanocobalamin (,VITAMIN B-12,) 1000 MCG/ML injection Inject 1 mL (1,000 mcg total) into the muscle every 30 (thirty) days. 10/04/15  Yes Roderick PeeJeffrey A Todd, MD  donepezil (ARICEPT) 10 MG tablet Take 1 tablet (10 mg total) by mouth daily. 11/26/14  Yes Levert FeinsteinYijun Yan, MD  memantine (NAMENDA) 10 MG tablet Take 1 tablet (10 mg total) by mouth 2 (two) times daily. 11/26/14  Yes Levert FeinsteinYijun Yan, MD  SYRINGE/NEEDLE, DISP, 1 ML (BD ECLIPSE SYRINGE) 25G X 5/8" 1 ML MISC 1 each by Does not apply route every 30 (thirty) days. 10/04/15  Yes Roderick PeeJeffrey A Todd, MD    Review of Systems: Negative unless mentioned above.   PE:  BP 110/52 mmHg  Temp(Src) 98.9 F (37.2 C) (Oral)  Wt 106 lb 14.4 oz (48.49 kg) Constitutional: Alert and oriented. NAD noted Heart normal rate, normal rhythm  Lungs: Clear to auscultation  Back: No CVA tenderness Abdomen: NO distension, pain, or rebound. Normactive bowel sounds   Results for orders placed or performed in visit on 09/14/15  PPD  Result Value Ref Range   TB Skin Test Negative    Induration 0 mm mm    Assessment and Plan:    1. Acute cystitis without hematuria [N30.00]  - sulfamethoxazole-trimethoprim (BACTRIM DS,SEPTRA DS) 800-160 MG tablet; Take 1 tablet by mouth 2 (two) times daily.  Dispense: 6 tablet; Refill: 0 - Urine culture - POCT Urinalysis Dipstick + 2  leukocytes.  - Follow up with fever, abdominal pain, or worsening symptoms.   Shirline Freesory Marieliz Strang, NP

## 2016-03-22 NOTE — Patient Instructions (Addendum)
Lori [name] has a urinary tract infection. I have prescribed Bactrim BID for 3 days.   Drink plenty of fluids  Follow up with fever greater than 101, continued confusion, or any other symptoms.      Urinary Tract Infection Urinary tract infections (UTIs) can develop anywhere along your urinary tract. Your urinary tract is your body's drainage system for removing wastes and extra water. Your urinary tract includes two kidneys, two ureters, a bladder, and a urethra. Your kidneys are a pair of bean-shaped organs. Each kidney is about the size of your fist. They are located below your ribs, one on each side of your spine. CAUSES Infections are caused by microbes, which are microscopic organisms, including fungi, viruses, and bacteria. These organisms are so small that they can only be seen through a microscope. Bacteria are the microbes that most commonly cause UTIs. SYMPTOMS  Symptoms of UTIs may vary by age and gender of the patient and by the location of the infection. Symptoms in young women typically include a frequent and intense urge to urinate and a painful, burning feeling in the bladder or urethra during urination. Older women and men are more likely to be tired, shaky, and weak and have muscle aches and abdominal pain. A fever may mean the infection is in your kidneys. Other symptoms of a kidney infection include pain in your back or sides below the ribs, nausea, and vomiting. DIAGNOSIS To diagnose a UTI, your caregiver will ask you about your symptoms. Your caregiver will also ask you to provide a urine sample. The urine sample will be tested for bacteria and white blood cells. White blood cells are made by your body to help fight infection. TREATMENT  Typically, UTIs can be treated with medication. Because most UTIs are caused by a bacterial infection, they usually can be treated with the use of antibiotics. The choice of antibiotic and length of treatment depend on your symptoms  and the type of bacteria causing your infection. HOME CARE INSTRUCTIONS  If you were prescribed antibiotics, take them exactly as your caregiver instructs you. Finish the medication even if you feel better after you have only taken some of the medication.  Drink enough water and fluids to keep your urine clear or pale yellow.  Avoid caffeine, tea, and carbonated beverages. They tend to irritate your bladder.  Empty your bladder often. Avoid holding urine for long periods of time.  Empty your bladder before and after sexual intercourse.  After a bowel movement, women should cleanse from front to back. Use each tissue only once. SEEK MEDICAL CARE IF:   You have back pain.  You develop a fever.  Your symptoms do not begin to resolve within 3 days. SEEK IMMEDIATE MEDICAL CARE IF:   You have severe back pain or lower abdominal pain.  You develop chills.  You have nausea or vomiting.  You have continued burning or discomfort with urination. MAKE SURE YOU:   Understand these instructions.  Will watch your condition.  Will get help right away if you are not doing well or get worse.   This information is not intended to replace advice given to you by your health care provider. Make sure you discuss any questions you have with your health care provider.   Document Released: 08/09/2005 Document Revised: 07/21/2015 Document Reviewed: 12/08/2011 Elsevier Interactive Patient Education [employer]2016 Elsevier Inc.

## 2016-03-24 LAB — URINE CULTURE: Colony Count: >=100000

## 2016-03-28 DIAGNOSIS — L820 Inflamed seborrheic keratosis: Secondary | ICD-10-CM | POA: Diagnosis not present

## 2016-03-28 DIAGNOSIS — L309 Dermatitis, unspecified: Secondary | ICD-10-CM | POA: Diagnosis not present

## 2016-04-24 ENCOUNTER — Encounter: Payer: Self-pay | Admitting: Family Medicine

## 2016-04-24 ENCOUNTER — Ambulatory Visit (INDEPENDENT_AMBULATORY_CARE_PROVIDER_SITE_OTHER): Payer: Medicare Other | Admitting: Family Medicine

## 2016-04-24 VITALS — BP 120/70 | HR 63 | Temp 98.4°F | Resp 16 | Ht 61.0 in | Wt 106.5 lb

## 2016-04-24 DIAGNOSIS — J45909 Unspecified asthma, uncomplicated: Secondary | ICD-10-CM

## 2016-04-24 DIAGNOSIS — J449 Chronic obstructive pulmonary disease, unspecified: Secondary | ICD-10-CM | POA: Diagnosis not present

## 2016-04-24 DIAGNOSIS — J4489 Other specified chronic obstructive pulmonary disease: Secondary | ICD-10-CM

## 2016-04-24 MED ORDER — PREDNISONE 10 MG PO TABS: ORAL_TABLET | ORAL | Status: DC

## 2016-04-24 MED ORDER — CLARITHROMYCIN 250 MG PO TABS: 250.0000 mg | ORAL_TABLET | Freq: Two times a day (BID) | ORAL | Status: DC

## 2016-04-24 NOTE — Patient Instructions (Signed)
Please take medications as directed and follow up in 48 to 72 hours for a recheck on symptoms and treatment. If your symptoms worsen, or you develop new symptoms, or a fever >101, please seek medical attention.

## 2016-04-24 NOTE — Progress Notes (Signed)
Subjective:    Patient ID: Lori [name], female    DOB: March 02, 1920, 82 y.o.   MRN: [id number]  HPI Lori [name] is a 82 year old female who presents today with a cough for one week productive of yellow sputum. Associated rhinitis and watery eyes. Denies fever, chills, sweats, sinus pressure/pain, ear pain, tooth pain, SOB, dyspnea on exertion, N/V/D, chest pain and palpitations. No recent sick contact exposure. Lives in an assisted living facility. She is a nonsmoker.  She has a history of COPD and asthma. Pulse ox on room air is 94% Previous history of symptoms similar to the ones she is experiencing today.  Review of Systems  Constitutional: Negative for fever and chills.  HENT: Positive for rhinorrhea. Negative for ear pain, postnasal drip, sneezing and sore throat.   Eyes:       Watery eyes  Respiratory: Positive for cough. Negative for shortness of breath.   Cardiovascular: Negative for chest pain, palpitations and leg swelling.  Gastrointestinal: Negative for nausea, vomiting, abdominal pain, diarrhea and constipation.  Genitourinary: Negative for dysuria, urgency, frequency, hematuria and flank pain.  Musculoskeletal: Negative for myalgias and arthralgias.  Skin: Negative for rash.  Neurological: Negative for dizziness, light-headedness and headaches.   Past Medical History  Diagnosis Date  . Osteoporosis   . Mitral stenosis   . Migraine   . Hearing loss   . Pneumonia   . Cataract     bilateral  . UTI (urinary tract infection)   . Memory change      Social History   Social History  . Marital Status: Widowed    Spouse Name: N/A  . Number of Children: 6  . Years of Education: 12+   Occupational History  . Retired    Social History Main Topics  . Smoking status: Former Smoker -- 0.50 packs/day for 24 years    Types: Cigarettes    Quit date: 11/13/1965  . Smokeless tobacco: Never Used  . Alcohol Use: 0.6 oz/week    1 Glasses of wine per week     Comment:  occ  . Drug Use: No  . Sexual Activity: Not on file   Other Topics Concern  . Not on file   Social History Narrative   Patient is widowed and lives alone.   Patient had six children and has 5 living children now.   Patient is retired.   Patient has a high school education and [employer].   Patient is right-handed.   Patient drinks 2-3 cups of coffee daily.    Past Surgical History  Procedure Laterality Date  . Appendectomy    . Abdominal hysterectomy    . Bilateral salpingoophorectomy    . Eye surgery      bilateral  . Tubal ligation      Family History  Problem Relation Age of Onset  . Heart Problems Mother   . Heart Problems Father     No Known Allergies  Current Outpatient Prescriptions on File Prior to Visit  Medication Sig Dispense Refill  . Calcium Carbonate-Vitamin D (CALCIUM PLUS VITAMIN D PO) Take by mouth.    . cyanocobalamin (,VITAMIN B-12,) 1000 MCG/ML injection Inject 1 mL (1,000 mcg total) into the muscle every 30 (thirty) days. 30 mL 11  . donepezil (ARICEPT) 10 MG tablet Take 1 tablet (10 mg total) by mouth daily. 30 tablet 11  . memantine (NAMENDA) 10 MG tablet Take 1 tablet (10 mg total) by mouth 2 (two) times daily.  60 tablet 11  . SYRINGE/NEEDLE, DISP, 1 ML (BD ECLIPSE SYRINGE) 25G X 5/8" 1 ML MISC 1 each by Does not apply route every 30 (thirty) days. 100 each 11   No current facility-administered medications on file prior to visit.    BP 120/70 mmHg  Pulse 63  Temp(Src) 98.4 F (36.9 C) (Oral)  Resp 16  Ht 5\' 1"  (1.549 m)  Wt 106 lb 8 oz (48.308 kg)  BMI 20.13 kg/m2  SpO2 94%       Objective:   Physical Exam  Constitutional: She is oriented to person, place, and time. She appears well-developed and well-nourished.  HENT:  Nose: Rhinorrhea present. Right sinus exhibits no frontal sinus tenderness. Left sinus exhibits no maxillary sinus tenderness and no frontal sinus tenderness.  Mouth/Throat: Mucous membranes are normal. No  oropharyngeal exudate or posterior oropharyngeal erythema.  Cerumen noted bilaterally. Able to visualize TM which are WNL  Eyes: Pupils are equal, round, and reactive to light. No scleral icterus.  Neck: Neck supple.  Pulmonary/Chest: Effort normal.  Bilateral wheezing and rhonchi noted  Abdominal: Soft. There is no tenderness.  Musculoskeletal: She exhibits no edema.  Lymphadenopathy:    She has no cervical adenopathy.  Neurological: She is alert and oriented to person, place, and time.  Skin: Skin is warm and dry. No rash noted.        Assessment & Plan:  1. Chronic obstructive airway disease with asthma (HCC) Suspect viral syndrome with secondary asthma. Will treat aggressively due to underlying disease and age. Follow up in 48 to 72 hours for reevaluation or sooner if needed.  - predniSONE (DELTASONE) 10 MG tablet; Take 4 tablets once daily for 2 days, 3 tablets daily for 2 days, 2 tablets daily for 2 days, and 1 tablet daily for 2 days.  Dispense: 20 tablet; Refill: 0 - clarithromycin (BIAXIN) 250 MG tablet; Take 1 tablet (250 mg total) by mouth 2 (two) times daily.  Dispense: 20 tablet; Refill: 0  Roddie McJulia Damarious Holtsclaw, FNP-C

## 2016-04-24 NOTE — Progress Notes (Signed)
Pre visit review using our clinic review tool, if applicable. No additional management support is needed unless otherwise documented below in the visit note. 

## 2016-04-27 ENCOUNTER — Ambulatory Visit (INDEPENDENT_AMBULATORY_CARE_PROVIDER_SITE_OTHER): Payer: Medicare Other | Admitting: Family Medicine

## 2016-04-27 ENCOUNTER — Encounter: Payer: Self-pay | Admitting: Family Medicine

## 2016-04-27 VITALS — BP 130/68 | HR 68 | Temp 98.2°F | Ht 61.0 in | Wt 106.0 lb

## 2016-04-27 DIAGNOSIS — J449 Chronic obstructive pulmonary disease, unspecified: Secondary | ICD-10-CM | POA: Diagnosis not present

## 2016-04-27 DIAGNOSIS — J45909 Unspecified asthma, uncomplicated: Secondary | ICD-10-CM

## 2016-04-27 NOTE — Progress Notes (Signed)
Subjective:    Patient ID: Lori [name], female    DOB: 01/03/1920, 82 y.o.   MRN: [id number]  HPI  Lori [name] is a 82 year old female who presents today for follow up after being evaluated and treated 3 days ago with an exacerbation of her asthma with chronic obstructive airway disease.  She reports that her symptoms have improved overall Symptoms still present today include cough but patient states that cough has improved  Denies fever, chills, sweats, sinus pressure/pain, ear pain, tooth pain, SOB, DOE, N/V/D, chest pain and palpitations.  Lives in an assisted living facility. She is a nonsmoker. Treatment of prednisone and Biaxin are tolerated well without adverse effects.   Review of Systems  Constitutional: Negative for fever and chills.  HENT: Positive for rhinorrhea.   Eyes: Negative for redness.       Watery eyes  Respiratory: Positive for cough.   Cardiovascular: Negative for chest pain and palpitations.  Gastrointestinal: Negative for nausea, vomiting, abdominal pain and diarrhea.  Skin: Negative for rash.  Neurological: Negative for dizziness, light-headedness and headaches.   Past Medical History  Diagnosis Date  . Osteoporosis   . Mitral stenosis   . Migraine   . Hearing loss   . Pneumonia   . Cataract     bilateral  . UTI (urinary tract infection)   . Memory change      Social History   Social History  . Marital Status: Widowed    Spouse Name: N/A  . Number of Children: 6  . Years of Education: 12+   Occupational History  . Retired    Social History Main Topics  . Smoking status: Former Smoker -- 0.50 packs/day for 24 years    Types: Cigarettes    Quit date: 11/13/1965  . Smokeless tobacco: Never Used  . Alcohol Use: 0.6 oz/week    1 Glasses of wine per week     Comment: occ  . Drug Use: No  . Sexual Activity: Not on file   Other Topics Concern  . Not on file   Social History Narrative   Patient is widowed and lives alone.   Patient had six children and has 5 living children now.   Patient is retired.   Patient has a high school education and [employer]   Patient is right-handed.   Patient drinks 2-3 cups of coffee daily.    Past Surgical History  Procedure Laterality Date  . Appendectomy    . Abdominal hysterectomy    . Bilateral salpingoophorectomy    . Eye surgery      bilateral  . Tubal ligation      Family History  Problem Relation Age of Onset  . Heart Problems Mother   . Heart Problems Father     No Known Allergies  Current Outpatient Prescriptions on File Prior to Visit  Medication Sig Dispense Refill  . Calcium Carbonate-Vitamin D (CALCIUM PLUS VITAMIN D PO) Take by mouth.    . clarithromycin (BIAXIN) 250 MG tablet Take 1 tablet (250 mg total) by mouth 2 (two) times daily. 20 tablet 0  . cyanocobalamin (,VITAMIN B-12,) 1000 MCG/ML injection Inject 1 mL (1,000 mcg total) into the muscle every 30 (thirty) days. 30 mL 11  . donepezil (ARICEPT) 10 MG tablet Take 1 tablet (10 mg total) by mouth daily. 30 tablet 11  . memantine (NAMENDA) 10 MG tablet Take 1 tablet (10 mg total) by mouth 2 (two) times daily. 60 tablet 11  .  predniSONE (DELTASONE) 10 MG tablet Take 4 tablets once daily for 2 days, 3 tablets daily for 2 days, 2 tablets daily for 2 days, and 1 tablet daily for 2 days. 20 tablet 0  . SYRINGE/NEEDLE, DISP, 1 ML (BD ECLIPSE SYRINGE) 25G X 5/8" 1 ML MISC 1 each by Does not apply route every 30 (thirty) days. 100 each 11   No current facility-administered medications on file prior to visit.    BP 130/68 mmHg  Pulse 68  Temp(Src) 98.2 F (36.8 C) (Oral)  Ht  (1.549 m)  Wt 106 lb (48.081 kg)  BMI 20.04 kg/m2  SpO2 93%       Objective:   Physical Exam  Constitutional: She is oriented to person, place, and time. She appears well-developed and well-nourished.  Eyes: Pupils are equal, round, and reactive to light. No scleral icterus.  Neck: Neck supple.  Cardiovascular:  Normal rate and regular rhythm.   Pulmonary/Chest: Effort normal and breath sounds normal. She has no wheezes. She has no rales.  COPD chest configuration noted. No wheezing or rhonchi present. Lung sounds have greatly improved from previous visit.  Lymphadenopathy:    She has no cervical adenopathy.  Neurological: She is alert and oriented to person, place, and time.  Skin: Skin is warm and dry. No rash noted.          Assessment & Plan:  1. Chronic obstructive airway disease with asthma (HCC) No wheezing or rhonchi noted today which is an improvement from previous visit 3 days ago. Continue Biaxin and prednisone as directed. Advised patient to follow up if symptoms do not continue to improve with treatment, worsen, or she develops new symptoms or a fever >101. Further advised patient to follow up with her PCP as regularly scheduled. Patient voiced understanding and agreed with plan.  Roddie Mc, FNP-C

## 2016-04-27 NOTE — Patient Instructions (Signed)
Continue Biaxin and prednisone as prescribed. If symptoms do not continue to improve or if you develop new symptoms or cough reoccurs, please follow up with your provider for further evaluation and treatment.

## 2016-06-01 DIAGNOSIS — R339 Retention of urine, unspecified: Secondary | ICD-10-CM | POA: Diagnosis not present

## 2016-06-01 DIAGNOSIS — R330 Drug induced retention of urine: Secondary | ICD-10-CM | POA: Diagnosis not present

## 2016-07-04 ENCOUNTER — Encounter: Payer: Self-pay | Admitting: Family Medicine

## 2016-07-05 DIAGNOSIS — N390 Urinary tract infection, site not specified: Secondary | ICD-10-CM | POA: Diagnosis not present

## 2016-07-06 ENCOUNTER — Telehealth: Payer: Self-pay | Admitting: Family Medicine

## 2016-07-06 NOTE — Telephone Encounter (Signed)
[name] [place] state they need a order to do a urinalysis on the patient the facility doctor can not do the orders it has to come from her PCP

## 2016-07-06 NOTE — Telephone Encounter (Signed)
Communicating with Dr. Tawanna Coolerodd. [facility] and informed her per Dr. Tawanna Coolerodd, pt will need an appointment scheduled and a U/A and culture must be done.Dr. Tawanna Coolerodd states " but to accurate urine U/A and culture must be done in and out cath"   [facility]/07/16 @ 10:30am

## 2016-07-07 ENCOUNTER — Ambulatory Visit (INDEPENDENT_AMBULATORY_CARE_PROVIDER_SITE_OTHER): Payer: Medicare Other | Admitting: Adult Health

## 2016-07-07 DIAGNOSIS — N10 Acute tubulo-interstitial nephritis: Secondary | ICD-10-CM

## 2016-07-07 LAB — POCT URINALYSIS DIPSTICK
Bilirubin, UA: NEGATIVE
Blood, UA: 10
Glucose, UA: NEGATIVE
KETONES UA: NEGATIVE
NITRITE UA: POSITIVE
PH UA: 6
PROTEIN UA: 15
Spec Grav, UA: 1.025
Urobilinogen, UA: 0.2

## 2016-07-07 MED ORDER — SULFAMETHOXAZOLE-TRIMETHOPRIM 800-160 MG PO TABS: 1.0000 | ORAL_TABLET | Freq: Two times a day (BID) | ORAL | 0 refills | Status: DC

## 2016-07-07 NOTE — Progress Notes (Signed)
Pre visit review using our clinic review tool, if applicable. No additional management support is needed unless otherwise documented below in the visit note. 

## 2016-07-07 NOTE — Patient Instructions (Signed)
It was great seeing you again.   Your urinalaysis shows a urinary tract infection.   I have sent in a prescription for Bactrim to the pharmacy. Take twice a day for 10 days.   Stay well hydrated.

## 2016-07-07 NOTE — Progress Notes (Signed)
   Subjective:    Patient ID: Lori [name], female    DOB: 10/01/1920, 82 y.o.   MRN: [id number]  HPI  82 year old female who presents to the office today with her daughter for possible UTI. The patient denies any symptoms but the daughter reports that over the last week she has had acute episodes of confusion(past baseline). There has been multiple occassions where Lori [name] has called another daughter at 11:30 at night and states " I am ready to go. Are you picking me up?".   Denies having any fevers or feeling acutely ill.   Review of Systems  Constitutional: Negative.   Respiratory: Negative.   Cardiovascular: Negative.   Genitourinary: Negative.   Musculoskeletal: Negative.   Psychiatric/Behavioral: Positive for confusion. Negative for agitation, behavioral problems, hallucinations and sleep disturbance.  All other systems reviewed and are negative.      Objective:   Physical Exam  Constitutional: She is oriented to person, place, and time. She appears well-developed and well-nourished. No distress.  Cardiovascular: Normal rate, regular rhythm, normal heart sounds and intact distal pulses.  Exam reveals no gallop and no friction rub.   No murmur heard. Pulmonary/Chest: Effort normal and breath sounds normal. No respiratory distress. She has no wheezes. She has no rales.  Abdominal: Soft. Normal appearance. She exhibits no distension and no mass. There is no tenderness. There is CVA tenderness. There is no rebound and no guarding.  Neurological: She is alert and oriented to person, place, and time.  Skin: Skin is warm and dry. No rash noted. She is not diaphoretic. No erythema. No pallor.  Psychiatric: She has a normal mood and affect. Her behavior is normal. Judgment and thought content normal.  Nursing note and vitals reviewed.     Assessment & Plan:  1. Acute pyelonephritis - Acute confusion possible related to UTI or may be sundowning. She is alert and oriented today in  the office.  - POCT urinalysis dipstick + Leuks, + nitrates, + blood - Urine culture - sulfamethoxazole-trimethoprim (BACTRIM DS,SEPTRA DS) 800-160 MG tablet; Take 1 tablet by mouth 2 (two) times daily.  Dispense: 14 tablet; Refill: 0 - Stay well hydrated  - Follow up if no improvement   Shirline Freesory Jyquan Kenley, NP   Shirline Freesory Tomisha Reppucci, NP]

## 2016-07-09 LAB — URINE CULTURE: Organism ID, Bacteria: 10000

## 2016-07-31 ENCOUNTER — Telehealth: Payer: Self-pay | Admitting: Family Medicine

## 2016-07-31 ENCOUNTER — Encounter: Payer: Self-pay | Admitting: Neurology

## 2016-07-31 ENCOUNTER — Ambulatory Visit (INDEPENDENT_AMBULATORY_CARE_PROVIDER_SITE_OTHER): Payer: Medicare Other | Admitting: Neurology

## 2016-07-31 VITALS — BP 101/59 | HR 60 | Ht 61.0 in | Wt 107.5 lb

## 2016-07-31 DIAGNOSIS — G3184 Mild cognitive impairment, so stated: Secondary | ICD-10-CM | POA: Diagnosis not present

## 2016-07-31 MED ORDER — DONEPEZIL HCL 10 MG PO TABS: 10.0000 mg | ORAL_TABLET | Freq: Every day | ORAL | 4 refills | Status: DC

## 2016-07-31 MED ORDER — MEMANTINE HCL 10 MG PO TABS: 10.0000 mg | ORAL_TABLET | Freq: Two times a day (BID) | ORAL | 4 refills | Status: DC

## 2016-07-31 NOTE — Telephone Encounter (Signed)
[name] states pt has not signed up to be with a dr at their facility.  So they need Dr Tawanna Coolerodd to look at her labs and advise if pt needs an abx.   They sent in info earlier,a nd Dr Tawanna Coolerodd advised pt to see their dr, but pt cannot. Must come form her pcp

## 2016-07-31 NOTE — Progress Notes (Signed)
[facility]  PATIENT: Lori [name] DOB: 09-17-1920  HISTORY OF PRESENT ILLNESS:Lori [name], 82 year old female returns for followup of memory trouble, primary care Dr. Tawanna Cooler  HISTORY: She has memory loss, visual hallucination She was previously healthy, living independently, had more than 12 years of education, has 6 children, retired as a Diplomatic Services operational officer, still working part-time job, 2 days a week,. She is a Education administrator.   Her son noticed that she has mild memory loss, she still lives independently, driving short distance, cooking, doing laundry all by herself, since February 2014, she began to have episode of visual hallucination, she sees people in her room, talking, came in through the window, sometimes very bothersome for her.  She denies gait difficulty, no loss sense of smells.  Most recent laboratory evaluation showed normal CBC, CMP, TSH, she is on B12 IM supplement MRI of brain 2014: Moderate atrophy, moderate small vessel disease  She is currently on Namenda 10 twice daily and Aricept 10mg  daily. She continues to live independently and is accompanied by her daughter today. No safety issues identified in the home. One fall in the last 6 months. The daughter feels memory is stable. 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 Calcium Carbonate-Vitamin D (CALCIUM PLUS VITAMIN D PO) Take by mouth.    .  clarithromycin (BIAXIN) 250 MG tablet Take 1 tablet (250 mg total) by mouth 2 (two) times daily. 20 tablet 0  . cyanocobalamin (,VITAMIN B-12,) 1000 MCG/ML injection Inject 1 mL (1,000 mcg total) into the muscle every 30 (thirty) days. 30 mL 11  . donepezil (ARICEPT) 10 MG tablet Take 1 tablet (10 mg total) by mouth daily. 30 tablet 11  . memantine (NAMENDA) 10 MG tablet Take 1 tablet (10 mg total) by mouth 2 (two) times daily. 60 tablet 11  . predniSONE (DELTASONE) 10 MG tablet Take 4 tablets once daily for 2 days, 3 tablets daily for 2 days, 2 tablets daily for 2 days, and 1 tablet daily for 2 days. 20 tablet 0  . sulfamethoxazole-trimethoprim (BACTRIM DS,SEPTRA DS) 800-160 MG tablet Take 1 tablet by mouth 2 (two) times daily. 14 tablet 0  . SYRINGE/NEEDLE, DISP, 1 ML (BD ECLIPSE SYRINGE) 25G X 5/8" 1 ML MISC 1 each by Does not apply route every 30 (thirty) days. 100 each 11   No facility-administered medications prior to visit.     PAST MEDICAL HISTORY: Past Medical History:  Diagnosis Date  . Cataract    bilateral  . Hearing loss   . Memory change   . Migraine   . Mitral stenosis   . Osteoporosis   . Pneumonia   . UTI (urinary tract infection)     PAST SURGICAL HISTORY: Past Surgical History:  Procedure Laterality Date  . ABDOMINAL HYSTERECTOMY    . APPENDECTOMY    . BILATERAL SALPINGOOPHORECTOMY    . EYE SURGERY     bilateral  .  TUBAL LIGATION      FAMILY HISTORY: Family History  Problem Relation Age of Onset  . Heart Problems Mother   . Heart Problems Father     SOCIAL HISTORY: Social History   Social History  . Marital status: Widowed    Spouse name: N/A  . Number of children: 6  . Years of education: 12+   Occupational History  . Retired Retired   Social History Main Topics  . Smoking status: Former Smoker    Packs/day: 0.50    Years: 24.00    Types: Cigarettes    Quit date: 11/13/1965  . Smokeless tobacco: Never Used  . Alcohol use 0.6 oz/week     1 Glasses of wine per week     Comment: occ  . Drug use: No  . Sexual activity: Not on file   Other Topics Concern  . Not on file   Social History Narrative   Patient is widowed and lives alone.   Patient had six children and has 5 living children now.   Patient is retired.   Patient has a high school education and [employer].   Patient is right-handed.   Patient drinks 2-3 cups of coffee daily.     PHYSICAL EXAM  Vitals:   07/31/16 1209  BP: (!) 101/59  Pulse: 60  Weight: 107 lb 8 oz (48.8 kg)  Height: 5\' 1"  (1.549 m)   Body mass index is 20.31 kg/m.  PHYSICAL EXAMNIATION:  Gen: NAD, conversant, well nourised, obese, well groomed                     Cardiovascular: Regular rate rhythm, no peripheral edema, warm, nontender. Eyes: Conjunctivae clear without exudates or hemorrhage Neck: Supple, no carotid bruise. Pulmonary: Clear to auscultation bilaterally   NEUROLOGICAL EXAM:  MENTAL STATUS: Speech:    Speech is normal; fluent and spontaneous with normal comprehension.  Cognition: Mini-Mental Status Examination 25/30, animal naming 6     Orientation: She is not oriented to date, year    recent and remote memory: She missed 3/3 recalls     Normal Attention span and concentration     Normal Language, naming, repeating,spontaneous speech     Fund of knowledge   CRANIAL NERVES: CN II: Visual fields are full to confrontation. Fundoscopic exam is normal with sharp discs and no vascular changes. Pupils are round equal and briskly reactive to light. CN III, IV, VI: extraocular movement are normal. No ptosis. CN V: Facial sensation is intact to pinprick in all 3 divisions bilaterally. Corneal responses are intact.  CN VII: Face is symmetric with normal eye closure and smile. CN VIII: Hearing is normal to rubbing fingers CN IX, X: Palate elevates symmetrically. Phonation is normal. CN XI: Head turning and shoulder shrug are intact CN XII: Tongue is midline with normal  movements and no atrophy.  MOTOR: There is no pronator drift of out-stretched arms. Muscle bulk and tone are normal. Muscle strength is normal.  REFLEXES: Reflexes are 2+ and symmetric at the biceps, triceps, knees, and ankles. Plantar responses are flexor.  SENSORY: Intact to light touch, pinprick, positional and vibratory sensation are intact in fingers and toes.  COORDINATION: Rapid alternating movements and fine finger movements are intact. There is no dysmetria on finger-to-nose and heel-knee-shin.    GAIT/STANCE: She needs push up to get up from seated position, kyphosis, cautious,  DIAGNOSTIC DATA (LABS, IMAGING, TESTING) -  ASSESSMENT AND PLAN  82 y.o. year old  female   Mild cognitive impairment  Refill her Aricept, Namenda  Continue follow-up with her primary care physician    Levert FeinsteinYijun Hanaan Gancarz, M.D. Ph.D.  [facility] [address] [place], [place] [address] Phone: [phone] Fax:      [phone]

## 2016-07-31 NOTE — Telephone Encounter (Signed)
Called and left voicemail for Lori [name] call to office. Per Dr. Tawanna Coolerodd this would be a good time to get her established with facility doctor.

## 2016-08-01 ENCOUNTER — Telehealth: Payer: Self-pay | Admitting: Emergency Medicine

## 2016-08-01 NOTE — Telephone Encounter (Signed)
Spoke with Marylene LandAngela and informed her that fax has been sent. Marylene Landngela verified confirmation of fax.    See chart note.

## 2016-08-01 NOTE — Telephone Encounter (Signed)
Marylene Landngela called from [facility] in reference to pt receiving an abx. Pt was seen on 07/07/16 by Kandee Keenory in which and abx was called into [facility] [address]. Marylene Landngela expressed that because the prescription was not sent to the facility pt has not received any for they use a in house pharmacy. Prescription has been hand written by Kandee Keenory and faxed to [place].

## 2016-08-29 DIAGNOSIS — R269 Unspecified abnormalities of gait and mobility: Secondary | ICD-10-CM | POA: Diagnosis not present

## 2016-08-29 DIAGNOSIS — G301 Alzheimer's disease with late onset: Secondary | ICD-10-CM | POA: Diagnosis not present

## 2016-09-26 DIAGNOSIS — J069 Acute upper respiratory infection, unspecified: Secondary | ICD-10-CM | POA: Diagnosis not present

## 2016-09-26 DIAGNOSIS — R400 Somnolence: Secondary | ICD-10-CM | POA: Diagnosis not present

## 2016-09-26 DIAGNOSIS — R269 Unspecified abnormalities of gait and mobility: Secondary | ICD-10-CM | POA: Diagnosis not present

## 2016-09-26 DIAGNOSIS — S61201A Unspecified open wound of left index finger without damage to nail, initial encounter: Secondary | ICD-10-CM | POA: Diagnosis not present

## 2016-09-26 DIAGNOSIS — Z7409 Other reduced mobility: Secondary | ICD-10-CM | POA: Diagnosis not present

## 2016-09-27 ENCOUNTER — Encounter (HOSPITAL_COMMUNITY): Payer: Self-pay | Admitting: Emergency Medicine

## 2016-09-27 ENCOUNTER — Emergency Department (HOSPITAL_COMMUNITY)
Admission: EM | Admit: 2016-09-27 | Discharge: 2016-09-27 | Disposition: A | Discharge status: Home / Self Care | Payer: Medicare Other | Attending: Emergency Medicine | Admitting: Emergency Medicine

## 2016-09-27 DIAGNOSIS — I10 Essential (primary) hypertension: Secondary | ICD-10-CM | POA: Insufficient documentation

## 2016-09-27 DIAGNOSIS — Z87891 Personal history of nicotine dependence: Secondary | ICD-10-CM | POA: Insufficient documentation

## 2016-09-27 DIAGNOSIS — J449 Chronic obstructive pulmonary disease, unspecified: Secondary | ICD-10-CM | POA: Diagnosis not present

## 2016-09-27 DIAGNOSIS — R197 Diarrhea, unspecified: Secondary | ICD-10-CM | POA: Insufficient documentation

## 2016-09-27 DIAGNOSIS — N289 Disorder of kidney and ureter, unspecified: Secondary | ICD-10-CM | POA: Diagnosis not present

## 2016-09-27 DIAGNOSIS — K2970 Gastritis, unspecified, without bleeding: Secondary | ICD-10-CM | POA: Diagnosis not present

## 2016-09-27 LAB — I-STAT CHEM 8, ED
BUN: 78 mg/dL — ABNORMAL HIGH (ref 6–20)
CHLORIDE: 107 mmol/L (ref 101–111)
Calcium, Ion: 1.15 mmol/L (ref 1.15–1.40)
Creatinine, Ser: 1.4 mg/dL — ABNORMAL HIGH (ref 0.44–1.00)
Glucose, Bld: 89 mg/dL (ref 65–99)
HCT: 39 % (ref 36.0–46.0)
HEMOGLOBIN: 13.3 g/dL (ref 12.0–15.0)
POTASSIUM: 4.4 mmol/L (ref 3.5–5.1)
SODIUM: 144 mmol/L (ref 135–145)
TCO2: 22 mmol/L (ref 0–100)

## 2016-09-27 LAB — CBC WITH DIFFERENTIAL/PLATELET
BASOS ABS: 0 10*3/uL (ref 0.0–0.1)
Basophils Relative: 1 %
EOS ABS: 0.1 10*3/uL (ref 0.0–0.7)
Eosinophils Relative: 1 %
HCT: 38.5 % (ref 36.0–46.0)
HEMOGLOBIN: 12.9 g/dL (ref 12.0–15.0)
LYMPHS ABS: 0.9 10*3/uL (ref 0.7–4.0)
LYMPHS PCT: 20 %
MCH: 31.9 pg (ref 26.0–34.0)
MCHC: 33.5 g/dL (ref 30.0–36.0)
MCV: 95.1 fL (ref 78.0–100.0)
Monocytes Absolute: 0.8 10*3/uL (ref 0.1–1.0)
Monocytes Relative: 19 %
NEUTROS PCT: 59 %
Neutro Abs: 2.6 10*3/uL (ref 1.7–7.7)
PLATELETS: 293 10*3/uL (ref 150–400)
RBC: 4.05 MIL/uL (ref 3.87–5.11)
RDW: 13 % (ref 11.5–15.5)
WBC: 4.3 10*3/uL (ref 4.0–10.5)

## 2016-09-27 MED ORDER — LOPERAMIDE HCL 2 MG PO CAPS: 4.0000 mg | ORAL_CAPSULE | ORAL | Status: DC | PRN

## 2016-09-27 MED ORDER — SODIUM CHLORIDE 0.9 % IV BOLUS (SEPSIS)
500.0000 mL | Freq: Once | INTRAVENOUS | Status: AC
  Administered 2016-09-27: 500 mL via INTRAVENOUS

## 2016-09-27 MED ORDER — LOPERAMIDE HCL 2 MG PO CAPS: 2.0000 mg | ORAL_CAPSULE | Freq: Four times a day (QID) | ORAL | 0 refills | Status: DC | PRN

## 2016-09-27 NOTE — ED Provider Notes (Signed)
[facility]-EMERGENCY DEPT Provider Note   CSN: [id number] Arrival date & time: 09/27/16  1617     History   Chief Complaint Chief Complaint  Patient presents with  . Diarrhea  . not eating    HPI Lori [name] is a 82 y.o. female.  Reportedly presents from her nursing care facility for evaluation of abdominal pain and diarrhea. It is also reported that she is "not eating". The patient is entirely unable to give history.  Level V caveat- dementia  HPI  Past Medical History:  Diagnosis Date  . Cataract    bilateral  . Hearing loss   . Memory change   . Migraine   . Mitral stenosis   . Osteoporosis   . Pneumonia   . UTI (urinary tract infection)     Patient Active Problem List   Diagnosis Date Noted  . Acute cystitis 01/14/2015  . Tremor, essential 06/30/2014  . Inflamed skin tag 09/01/2013  . Memory change   . Hallucinations 01/03/2013  . Rhinitis, nonallergic, chronic 11/22/2012  . Pernicious anemia 11/18/2012  . Neuropathy (HCC) 11/18/2012  . Corn of toe 11/18/2012  . Hypertension 06/13/2012  . Chronic obstructive airway disease with asthma (HCC) 10/26/2010  . CERUMEN IMPACTION, BILATERAL 10/05/2008  . BACK PAIN, ACUTE 01/10/2008  . MIGRAINE HEADACHE 07/23/2007  . LOSS, HEARING NEC 07/23/2007  . STENOSIS, MITRAL 07/23/2007  . OSTEOPOROSIS 07/23/2007  . EDEMA 07/23/2007    Past Surgical History:  Procedure Laterality Date  . ABDOMINAL HYSTERECTOMY    . APPENDECTOMY    . BILATERAL SALPINGOOPHORECTOMY    . EYE SURGERY     bilateral  . TUBAL LIGATION      OB History    No data available       Home Medications    Prior to Admission medications   Medication Sig Start Date End Date Taking? Authorizing Provider  Calcium Carbonate-Vitamin D (CALCIUM PLUS VITAMIN D PO) Take by mouth.    Historical Provider, MD  clarithromycin (BIAXIN) 250 MG tablet Take 1 tablet (250 mg total) by mouth 2 (two) times daily. 04/24/16   Roddie McJulia Kordsmeier, FNP    cyanocobalamin (,VITAMIN B-12,) 1000 MCG/ML injection Inject 1 mL (1,000 mcg total) into the muscle every 30 (thirty) days. 10/04/15   Roderick PeeJeffrey A Todd, MD  donepezil (ARICEPT) 10 MG tablet Take 1 tablet (10 mg total) by mouth daily. 07/31/16   Levert FeinsteinYijun Yan, MD  memantine (NAMENDA) 10 MG tablet Take 1 tablet (10 mg total) by mouth 2 (two) times daily. 07/31/16   Levert FeinsteinYijun Yan, MD  sulfamethoxazole-trimethoprim (BACTRIM DS,SEPTRA DS) 800-160 MG tablet Take 1 tablet by mouth 2 (two) times daily. 07/07/16   Shirline Freesory Nafziger, NP  SYRINGE/NEEDLE, DISP, 1 ML (BD ECLIPSE SYRINGE) 25G X 5/8" 1 ML MISC 1 each by Does not apply route every 30 (thirty) days. 10/04/15   Roderick PeeJeffrey A Todd, MD    Family History Family History  Problem Relation Age of Onset  . Heart Problems Mother   . Heart Problems Father     Social History Social History  Substance Use Topics  . Smoking status: Former Smoker    Packs/day: 0.50    Years: 24.00    Types: Cigarettes    Quit date: 11/13/1965  . Smokeless tobacco: Never Used  . Alcohol use 0.6 oz/week    1 Glasses of wine per week     Comment: occ     Allergies   Patient has no known allergies.   Review  of Systems Review of Systems  Unable to perform ROS: Dementia     Physical Exam Updated Vital Signs BP (!) 124/52   Pulse 92   Temp 98 F (36.7 C) (Oral)   Resp (!) 92   SpO2 90%   Physical Exam  Constitutional: She appears well-developed.  Elderly, frail  HENT:  Head: Normocephalic and atraumatic.  Mucous membranes are somewhat dry. There are no other oral lesions.  Eyes: Conjunctivae and EOM are normal. Pupils are equal, round, and reactive to light.  Neck: Normal range of motion and phonation normal. Neck supple.  Cardiovascular: Normal rate and regular rhythm.   Pulmonary/Chest: Effort normal and breath sounds normal. She exhibits no tenderness.  Abdominal: Soft. Bowel sounds are normal. She exhibits no distension. There is no tenderness. There is no  guarding.  Musculoskeletal: Normal range of motion.  Neurological: She is alert. No cranial nerve deficit. She exhibits normal muscle tone.  She is oriented to place, and person, not time.  Skin: Skin is warm and dry.  Psychiatric: She has a normal mood and affect. Her behavior is normal.  Nursing note and vitals reviewed.    ED Treatments / Results  Labs (all labs ordered are listed, but only abnormal results are displayed) Labs Reviewed  I-STAT CHEM 8, ED - Abnormal; Notable for the following:       Result Value   BUN 78 (*)    Creatinine, Ser 1.40 (*)    All other components within normal limits  CBC WITH DIFFERENTIAL/PLATELET    EKG  EKG Interpretation None       Radiology No results found.  Procedures Procedures (including critical care time)  Medications Ordered in ED Medications  loperamide (IMODIUM) capsule 4 mg (not administered)  sodium chloride 0.9 % bolus 500 mL (0 mLs Intravenous Stopped 09/27/16 1831)  sodium chloride 0.9 % bolus 500 mL (0 mLs Intravenous Stopped 09/27/16 1940)     Initial Impression / Assessment and Plan / ED Course  I have reviewed the triage vital signs and the nursing notes.  Pertinent labs & imaging results that were available during my care of the patient were reviewed by me and considered in my medical decision making (see chart for details).  Clinical Course as of Sep 27 2026  Wed Sep 27, 2016  1648 Patient offered water, crackers and peanut butter  [EW]  2025 The patient's daughter is here now and states that the patient has been having diarrhea, unknown number of times each day, for 1 week. She has not been treated for diarrhea yet. Yesterday, her PCP saw her and started her on Zithromax and Mucinex for "congestion". Here in the ED, she has tolerated oral fluids and half of a sandwich. She has also urinated twice and had 2 loose bowel movements.  [EW]    Clinical Course User Index [EW] Mancel BaleElliott Morgana Rowley, MD    Medications    loperamide (IMODIUM) capsule 4 mg (not administered)  sodium chloride 0.9 % bolus 500 mL (0 mLs Intravenous Stopped 09/27/16 1831)  sodium chloride 0.9 % bolus 500 mL (0 mLs Intravenous Stopped 09/27/16 1940)    Patient Vitals for the past 24 hrs:  BP Temp Temp src Pulse Resp SpO2  09/27/16 2010 (!) 124/52 - - 92 (!) 92 90 %  09/27/16 1900 120/89 - - 70 - 98 %  09/27/16 1830 124/59 - - 71 - 90 %  09/27/16 1800 (!) 117/51 - - 71 - 95 %  09/27/16  1749 120/58 - - 79 - 93 %  09/27/16 1640 115/60 98 F (36.7 C) Oral 73 17 97 %  09/27/16 1628 - - - - - 96 %    8:27 PM Reevaluation with update and discussion. After initial assessment and treatment, an updated evaluation reveals She remains alert, and is comfortable. Findings discussed with patient's daughter and all questions were answered. Autumn Gunn L    Final Clinical Impressions(s) / ED Diagnoses   Final diagnoses:  Diarrhea, unspecified type  Mild renal insufficiency    Nonspecific diarrhea, with dehydration and mild elevation of creatinine. Patient improved with treatment and is tolerating nutrition, orally, at this time. Doubt serious bacterial infection. Metabolic instability or impending vascular collapse.  Nursing Notes Reviewed/ Care Coordinated Applicable Imaging Reviewed Interpretation of Laboratory Data incorporated into ED treatment  The patient appears reasonably screened and/or stabilized for discharge and I doubt any other medical condition or other [facility] requiring further screening, evaluation, or treatment in the ED at this time prior to discharge.  Plan: Home Medications- Imodium prn; Home Treatments- push fluids; return here if the recommended treatment, does not improve the symptoms; Recommended follow up- PCP 1 week, recheck Creat.   New Prescriptions New Prescriptions   No medications on file     Mancel Bale, MD 09/27/16 2035

## 2016-09-27 NOTE — ED Notes (Signed)
Ate 1/2 [place] sandwich and 1/2 can gingerale with no problem.

## 2016-09-27 NOTE — ED Notes (Signed)
Bed: WA08 Expected date:  Expected time:  Means of arrival:  Comments: EMS 82 yo failure to thrive

## 2016-09-27 NOTE — ED Triage Notes (Signed)
Per EMS- patient is a [facility]. Staff reports that the patient has been having diarrhea, not eating, and c/o bilateral lower abdominal pain. Patient received 250 ml NS prior to arrival to the ED.

## 2016-09-27 NOTE — ED Notes (Signed)
Per Dr. Effie ShyWentz, pt. Could have some crackers and peanut butter along with a cup of water. Nurse aware.

## 2016-09-27 NOTE — Discharge Instructions (Signed)
Try to drink in 2 or 3 glasses of water each day.  Use the Imodium as directed for diarrhea.

## 2016-10-03 ENCOUNTER — Inpatient Hospital Stay (HOSPITAL_COMMUNITY)
Admission: EM | Admit: 2016-10-03 | Discharge: 2016-10-07 | DRG: 871 | Disposition: A | Discharge status: Home Health | Payer: Medicare Other | Attending: Internal Medicine | Admitting: Internal Medicine

## 2016-10-03 ENCOUNTER — Emergency Department (HOSPITAL_COMMUNITY): Payer: Medicare Other

## 2016-10-03 ENCOUNTER — Encounter (HOSPITAL_COMMUNITY): Payer: Self-pay | Admitting: Emergency Medicine

## 2016-10-03 DIAGNOSIS — R259 Unspecified abnormal involuntary movements: Secondary | ICD-10-CM | POA: Diagnosis not present

## 2016-10-03 DIAGNOSIS — I10 Essential (primary) hypertension: Secondary | ICD-10-CM | POA: Diagnosis not present

## 2016-10-03 DIAGNOSIS — R680 Hypothermia, not associated with low environmental temperature: Secondary | ICD-10-CM | POA: Diagnosis present

## 2016-10-03 DIAGNOSIS — N179 Acute kidney failure, unspecified: Secondary | ICD-10-CM | POA: Diagnosis present

## 2016-10-03 DIAGNOSIS — E861 Hypovolemia: Secondary | ICD-10-CM | POA: Diagnosis present

## 2016-10-03 DIAGNOSIS — Z87891 Personal history of nicotine dependence: Secondary | ICD-10-CM

## 2016-10-03 DIAGNOSIS — F0390 Unspecified dementia without behavioral disturbance: Secondary | ICD-10-CM | POA: Diagnosis not present

## 2016-10-03 DIAGNOSIS — M6282 Rhabdomyolysis: Secondary | ICD-10-CM | POA: Diagnosis not present

## 2016-10-03 DIAGNOSIS — S79911A Unspecified injury of right hip, initial encounter: Secondary | ICD-10-CM | POA: Diagnosis not present

## 2016-10-03 DIAGNOSIS — R296 Repeated falls: Secondary | ICD-10-CM | POA: Diagnosis present

## 2016-10-03 DIAGNOSIS — W19XXXD Unspecified fall, subsequent encounter: Secondary | ICD-10-CM | POA: Diagnosis not present

## 2016-10-03 DIAGNOSIS — E872 Acidosis, unspecified: Secondary | ICD-10-CM | POA: Diagnosis present

## 2016-10-03 DIAGNOSIS — Y92092 Bedroom in other non-institutional residence as the place of occurrence of the external cause: Secondary | ICD-10-CM

## 2016-10-03 DIAGNOSIS — T148XXA Other injury of unspecified body region, initial encounter: Secondary | ICD-10-CM | POA: Diagnosis not present

## 2016-10-03 DIAGNOSIS — S299XXA Unspecified injury of thorax, initial encounter: Secondary | ICD-10-CM | POA: Diagnosis not present

## 2016-10-03 DIAGNOSIS — N390 Urinary tract infection, site not specified: Secondary | ICD-10-CM | POA: Diagnosis present

## 2016-10-03 DIAGNOSIS — W1830XA Fall on same level, unspecified, initial encounter: Secondary | ICD-10-CM | POA: Diagnosis present

## 2016-10-03 DIAGNOSIS — I4891 Unspecified atrial fibrillation: Secondary | ICD-10-CM | POA: Diagnosis not present

## 2016-10-03 DIAGNOSIS — I369 Nonrheumatic tricuspid valve disorder, unspecified: Secondary | ICD-10-CM | POA: Diagnosis not present

## 2016-10-03 DIAGNOSIS — R7989 Other specified abnormal findings of blood chemistry: Secondary | ICD-10-CM

## 2016-10-03 DIAGNOSIS — M545 Low back pain: Secondary | ICD-10-CM | POA: Diagnosis not present

## 2016-10-03 DIAGNOSIS — M25551 Pain in right hip: Secondary | ICD-10-CM | POA: Diagnosis not present

## 2016-10-03 DIAGNOSIS — H9190 Unspecified hearing loss, unspecified ear: Secondary | ICD-10-CM | POA: Diagnosis present

## 2016-10-03 DIAGNOSIS — M79651 Pain in right thigh: Secondary | ICD-10-CM | POA: Diagnosis not present

## 2016-10-03 DIAGNOSIS — M810 Age-related osteoporosis without current pathological fracture: Secondary | ICD-10-CM | POA: Diagnosis present

## 2016-10-03 DIAGNOSIS — S199XXA Unspecified injury of neck, initial encounter: Secondary | ICD-10-CM | POA: Diagnosis not present

## 2016-10-03 DIAGNOSIS — R6521 Severe sepsis with septic shock: Secondary | ICD-10-CM | POA: Diagnosis present

## 2016-10-03 DIAGNOSIS — R748 Abnormal levels of other serum enzymes: Secondary | ICD-10-CM | POA: Diagnosis not present

## 2016-10-03 DIAGNOSIS — A419 Sepsis, unspecified organism: Secondary | ICD-10-CM | POA: Diagnosis not present

## 2016-10-03 DIAGNOSIS — S0990XA Unspecified injury of head, initial encounter: Secondary | ICD-10-CM | POA: Diagnosis not present

## 2016-10-03 DIAGNOSIS — R6520 Severe sepsis without septic shock: Secondary | ICD-10-CM | POA: Diagnosis not present

## 2016-10-03 DIAGNOSIS — S79921A Unspecified injury of right thigh, initial encounter: Secondary | ICD-10-CM | POA: Diagnosis not present

## 2016-10-03 DIAGNOSIS — M549 Dorsalgia, unspecified: Secondary | ICD-10-CM

## 2016-10-03 DIAGNOSIS — W19XXXA Unspecified fall, initial encounter: Secondary | ICD-10-CM | POA: Diagnosis present

## 2016-10-03 DIAGNOSIS — Z66 Do not resuscitate: Secondary | ICD-10-CM | POA: Diagnosis present

## 2016-10-03 DIAGNOSIS — M25559 Pain in unspecified hip: Secondary | ICD-10-CM | POA: Diagnosis not present

## 2016-10-03 DIAGNOSIS — R778 Other specified abnormalities of plasma proteins: Secondary | ICD-10-CM | POA: Diagnosis present

## 2016-10-03 LAB — COMPREHENSIVE METABOLIC PANEL
ALBUMIN: 4.2 g/dL (ref 3.5–5.0)
ALK PHOS: 61 U/L (ref 38–126)
ALT: 14 U/L (ref 14–54)
ANION GAP: 12 (ref 5–15)
AST: 20 U/L (ref 15–41)
BUN: 62 mg/dL — ABNORMAL HIGH (ref 6–20)
CALCIUM: 10.1 mg/dL (ref 8.9–10.3)
CO2: 21 mmol/L — AB (ref 22–32)
Chloride: 107 mmol/L (ref 101–111)
Creatinine, Ser: 1.65 mg/dL — ABNORMAL HIGH (ref 0.44–1.00)
GFR calc Af Amer: 30 mL/min — ABNORMAL LOW (ref >60)
GFR calc non Af Amer: 25 mL/min — ABNORMAL LOW (ref >60)
GLUCOSE: 131 mg/dL — AB (ref 65–99)
Potassium: 3.9 mmol/L (ref 3.5–5.1)
SODIUM: 140 mmol/L (ref 135–145)
Total Bilirubin: 1 mg/dL (ref 0.3–1.2)
Total Protein: 7.3 g/dL (ref 6.5–8.1)

## 2016-10-03 LAB — URINALYSIS, ROUTINE W REFLEX MICROSCOPIC
BILIRUBIN URINE: NEGATIVE
Glucose, UA: NEGATIVE mg/dL
HGB URINE DIPSTICK: NEGATIVE
KETONES UR: NEGATIVE mg/dL
Leukocytes, UA: NEGATIVE
NITRITE: NEGATIVE
Protein, ur: 100 mg/dL — AB
Specific Gravity, Urine: 1.016 (ref 1.005–1.030)
pH: 7.5 (ref 5.0–8.0)

## 2016-10-03 LAB — CBC WITH DIFFERENTIAL/PLATELET
BASOS PCT: 0 %
Basophils Absolute: 0 10*3/uL (ref 0.0–0.1)
Eosinophils Absolute: 0 10*3/uL (ref 0.0–0.7)
Eosinophils Relative: 0 %
HEMATOCRIT: 41.3 % (ref 36.0–46.0)
Hemoglobin: 14.4 g/dL (ref 12.0–15.0)
LYMPHS ABS: 0.8 10*3/uL (ref 0.7–4.0)
LYMPHS PCT: 8 %
MCH: 31.6 pg (ref 26.0–34.0)
MCHC: 34.9 g/dL (ref 30.0–36.0)
MCV: 90.6 fL (ref 78.0–100.0)
MONO ABS: 1 10*3/uL (ref 0.1–1.0)
MONOS PCT: 11 %
NEUTROS ABS: 7.2 10*3/uL (ref 1.7–7.7)
Neutrophils Relative %: 81 %
Platelets: 276 10*3/uL (ref 150–400)
RBC: 4.56 MIL/uL (ref 3.87–5.11)
RDW: 13.1 % (ref 11.5–15.5)
WBC: 9 10*3/uL (ref 4.0–10.5)

## 2016-10-03 LAB — TROPONIN I
TROPONIN I: 0.19 ng/mL — AB (ref <0.03)
Troponin I: 0.18 ng/mL (ref <0.03)

## 2016-10-03 LAB — I-STAT TROPONIN, ED: Troponin i, poc: 0.24 ng/mL (ref 0.00–0.08)

## 2016-10-03 LAB — URINE MICROSCOPIC-ADD ON

## 2016-10-03 LAB — I-STAT CG4 LACTIC ACID, ED: Lactic Acid, Venous: 2.12 mmol/L (ref 0.5–1.9)

## 2016-10-03 LAB — MRSA PCR SCREENING: MRSA BY PCR: NEGATIVE

## 2016-10-03 LAB — CK: CK TOTAL: 306 U/L — AB (ref 38–234)

## 2016-10-03 MED ORDER — ACETAMINOPHEN 650 MG RE SUPP: 650.0000 mg | Freq: Four times a day (QID) | RECTAL | Status: DC | PRN

## 2016-10-03 MED ORDER — MEMANTINE HCL 10 MG PO TABS
5.0000 mg | ORAL_TABLET | Freq: Every day | ORAL | Status: DC
  Administered 2016-10-04 – 2016-10-07 (×4): 5 mg via ORAL
  Filled 2016-10-03 (×4): qty 1

## 2016-10-03 MED ORDER — SODIUM CHLORIDE 0.9 % IV BOLUS (SEPSIS)
1000.0000 mL | Freq: Once | INTRAVENOUS | Status: AC
  Administered 2016-10-03: 1000 mL via INTRAVENOUS

## 2016-10-03 MED ORDER — MEMANTINE HCL 5 MG PO TABS: 5.0000 mg | ORAL_TABLET | Freq: Two times a day (BID) | ORAL | Status: DC

## 2016-10-03 MED ORDER — HEPARIN SODIUM (PORCINE) 5000 UNIT/ML IJ SOLN
5000.0000 [IU] | Freq: Three times a day (TID) | INTRAMUSCULAR | Status: DC
  Administered 2016-10-03 – 2016-10-06 (×10): 5000 [IU] via SUBCUTANEOUS
  Filled 2016-10-03 (×9): qty 1

## 2016-10-03 MED ORDER — ONDANSETRON HCL 4 MG/2ML IJ SOLN: 4.0000 mg | Freq: Four times a day (QID) | INTRAMUSCULAR | Status: DC | PRN

## 2016-10-03 MED ORDER — SODIUM CHLORIDE 0.9% FLUSH
3.0000 mL | Freq: Two times a day (BID) | INTRAVENOUS | Status: DC
  Administered 2016-10-03 – 2016-10-04 (×3): 3 mL via INTRAVENOUS

## 2016-10-03 MED ORDER — LOPERAMIDE HCL 2 MG PO CAPS: 2.0000 mg | ORAL_CAPSULE | Freq: Four times a day (QID) | ORAL | Status: DC | PRN

## 2016-10-03 MED ORDER — DONEPEZIL HCL 5 MG PO TABS
10.0000 mg | ORAL_TABLET | Freq: Every day | ORAL | Status: DC
  Administered 2016-10-03 – 2016-10-07 (×5): 10 mg via ORAL
  Filled 2016-10-03: qty 2
  Filled 2016-10-03: qty 1
  Filled 2016-10-03 (×2): qty 2
  Filled 2016-10-03: qty 1

## 2016-10-03 MED ORDER — SODIUM CHLORIDE 0.9 % IV SOLN
INTRAVENOUS | Status: DC
  Administered 2016-10-03 – 2016-10-07 (×9): via INTRAVENOUS

## 2016-10-03 MED ORDER — MEMANTINE HCL 10 MG PO TABS
10.0000 mg | ORAL_TABLET | Freq: Every day | ORAL | Status: DC
  Administered 2016-10-03 – 2016-10-06 (×4): 10 mg via ORAL
  Filled 2016-10-03: qty 1
  Filled 2016-10-03 (×2): qty 2
  Filled 2016-10-03: qty 1

## 2016-10-03 MED ORDER — FENTANYL CITRATE (PF) 100 MCG/2ML IJ SOLN
25.0000 ug | Freq: Once | INTRAMUSCULAR | Status: AC
  Administered 2016-10-03: 25 ug via INTRAVENOUS
  Filled 2016-10-03: qty 2

## 2016-10-03 MED ORDER — CEFTRIAXONE SODIUM 1 G IJ SOLR
1.0000 g | Freq: Once | INTRAMUSCULAR | Status: AC
  Administered 2016-10-03: 1 g via INTRAVENOUS
  Filled 2016-10-03: qty 10

## 2016-10-03 MED ORDER — CEFTRIAXONE SODIUM 1 G IJ SOLR
1.0000 g | INTRAMUSCULAR | Status: DC
Start: 2016-10-04 — End: 2016-10-07
  Administered 2016-10-04 – 2016-10-06 (×3): 1 g via INTRAVENOUS
  Filled 2016-10-03 (×4): qty 10

## 2016-10-03 MED ORDER — ACETAMINOPHEN 325 MG PO TABS
650.0000 mg | ORAL_TABLET | Freq: Four times a day (QID) | ORAL | Status: DC | PRN
  Administered 2016-10-04: 650 mg via ORAL
  Filled 2016-10-03: qty 2

## 2016-10-03 MED ORDER — ONDANSETRON HCL 4 MG PO TABS: 4.0000 mg | ORAL_TABLET | Freq: Four times a day (QID) | ORAL | Status: DC | PRN

## 2016-10-03 NOTE — Progress Notes (Signed)
[facility] from [facility]

## 2016-10-03 NOTE — Progress Notes (Signed)
Pt hypotensive but is alert.  70/30 range.  Informed Dr. Isidoro Donningai.  1L bolus ordered.  Erick Blinksuchman, Callen Zuba D, RN

## 2016-10-03 NOTE — Progress Notes (Signed)
eLink Physician-Brief Progress Note Patient Name: Lori [name] DOB: 07/23/1920 MRN: [id number]   Date of Service  10/03/2016  HPI/Events of Note  Sepsis - Repeat Assessment  Performed at:  430PM  Vitals     Blood pressure 126/75, pulse 78, temperature (!) 94.4 F (34.7 C), temperature source Axillary, resp. rate (!) 24, height 5' 2.5" (1.588 m), weight 42.4 kg (93 lb 7.6 oz), SpO2 97 %.    eICU Interventions  ALl other ROS reviewed and negative Repeat Check LA        Phat Dalton 10/03/2016, 4:21 PM

## 2016-10-03 NOTE — ED Notes (Signed)
Patient transported to X-ray 

## 2016-10-03 NOTE — Progress Notes (Signed)
CRITICAL VALUE ALERT  Critical value received:  Troponin 0.19  Date of notification:  10/03/16  Time of notification:  22:15    Nurse who received alert:  Domingo DimesSapna Jasleen Riepe  MD notified (1st page):  Yes  Time of first page:  22:20

## 2016-10-03 NOTE — H&P (Signed)
History and Physical        Hospital Admission Note Date: 10/03/2016  Patient name: Lori [name] Medical record number: [id number] Date of birth: Feb 27, 1920 Age: 82 y.o. Gender: female  PCP: Evette Georges, MD   Referring physician: Dr. Rayford Halsted  Patient coming from: ALF [facility]   Chief Complaint:  Sent from ALF after found on the floor  HPI: Patient is a 82 year old female with dementia, hearing loss, osteoporosis, hypertension, back pain and was sent from [facility] assisted living facility after she was found on the floor in her room. History was obtained from the patient's daughter on the phone and the son at the bedside. Patient's daughter reported that she was called when the patient was found on the floor. They do not know how long she was on the floor. Patient could not recall any events. She may have fallen sometime in the middle of the night. Patient's daughter reported that she had lot of diarrhea over the last 1 week and was significantly dehydrated, poor appetite, had been complaining of low back pain whenever she sat down. She had low-grade fevers in the last few days. Patient had been complaining of dysuria and increasing frequency of urination. Patient had no chest pain or shortness of breath. No vomiting. Patient herself was not able to provide any history.  ED work-up/course:  At the time of triage, temperature was 97.3, subsequently 94.4. Initially BP 73/35, heart rate 130s in atrial fibrillation which improved to 80s after IV fluid boluses. O2 sats 97% on room air. BP improved to 126/75. Multiple imagings negative for any fractures or dislocations. Lab work showed sodium 140, potassium 3.9 BUN 62, creatinine 1.65 CK 306 troponin 0.24 on a lactic acid 2.1 Chest x-ray negative for pneumonia WBC 9.0 EKG showed rate 122, atrial  fibrillation, STdepressions in the lateral leads  Review of Systems: Positives marked in 'bold' Unable to obtain any review of system from the patient due to her mental status and hearing deficit, she is unable to recall any events  Past Medical History: Past Medical History:  Diagnosis Date  . Cataract    bilateral  . Hearing loss   . Memory change   . Migraine   . Mitral stenosis   . Osteoporosis   . Pneumonia   . UTI (urinary tract infection)     Past Surgical History:  Procedure Laterality Date  . ABDOMINAL HYSTERECTOMY    . APPENDECTOMY    . BILATERAL SALPINGOOPHORECTOMY    . EYE SURGERY     bilateral  . TUBAL LIGATION      Medications: Prior to Admission medications   Medication Sig Start Date End Date Taking? Authorizing Provider  donepezil (ARICEPT) 10 MG tablet Take 1 tablet (10 mg total) by mouth daily. 07/31/16  Yes Levert Feinstein, MD  guaiFENesin (MUCINEX) 600 MG 12 hr tablet Take 600 mg by mouth 2 (two) times daily.   Yes Historical Provider, MD  loperamide (IMODIUM) 2 MG capsule Take 1 capsule (2 mg total) by mouth 4 (four) times daily as needed for diarrhea or loose stools. Patient taking differently: Take 2 mg by mouth every 6 (six) hours as needed  for diarrhea or loose stools.  09/27/16  Yes Mancel BaleElliott Wentz, MD  memantine (NAMENDA) 5 MG tablet Take 5-10 mg by mouth 2 (two) times daily. Takes 5 mg in the morning and 10 mg at bedtime   Yes Historical Provider, MD  cyanocobalamin (,VITAMIN B-12,) 1000 MCG/ML injection Inject 1 mL (1,000 mcg total) into the muscle every 30 (thirty) days. Patient not taking: Reported on 10/03/2016 10/04/15   Roderick PeeJeffrey A Todd, MD  memantine (NAMENDA) 10 MG tablet Take 1 tablet (10 mg total) by mouth 2 (two) times daily. Patient not taking: Reported on 10/03/2016 07/31/16   Levert FeinsteinYijun Yan, MD  sulfamethoxazole-trimethoprim (BACTRIM DS,SEPTRA DS) 800-160 MG tablet Take 1 tablet by mouth 2 (two) times daily. Patient not taking: Reported on  10/03/2016 07/07/16   Shirline Freesory Nafziger, NP    Allergies:  No Known Allergies  Social History:  reports that she quit smoking about 50 years ago. Her smoking use included Cigarettes. She has a 12.00 pack-year smoking history. She has never used smokeless tobacco. She reports that she drinks about 0.6 oz of alcohol per week . She reports that she does not use drugs.  Family History: Family History  Problem Relation Age of Onset  . Heart Problems Mother   . Heart Problems Father     Physical Exam: Blood pressure 126/75, pulse 78, temperature (!) 94.4 F (34.7 C), temperature source Axillary, resp. rate (!) 24, height 5' 2.5" (1.588 m), weight 42.4 kg (93 lb 7.6 oz), SpO2 97 %. General: Alert, awake, oriented x 1 to self, hearing deficit, fumbling with her hearing aids, dry mucosal membranes HEENT: normocephalic, atraumatic, anicteric sclera, pink conjunctiva, pupils equal and reactive to light and accomodation, oropharynx clear Neck: supple, no masses or lymphadenopathy, no goiter, no bruits  Heart: Regular rate and rhythm, without murmurs, rubs or gallops. Lungs: Clear to auscultation bilaterally, no wheezing, rales or rhonchi. Abdomen: Soft, nontender, nondistended, positive bowel sounds, no masses. Extremities: No clubbing, cyanosis or edema with positive pedal pulses. Neuro: Grossly intact, no focal neurological deficits, strength 5/5 upper and lower extremities bilaterally Psych: alert and oriented x 1, somewhat confused  Skin: no rashes or lesions, warm and dry   LABS on Admission:  Basic Metabolic Panel:  Recent Labs Lab 09/27/16 1706 10/03/16 1101  NA 144 140  K 4.4 3.9  CL 107 107  CO2  --  21*  GLUCOSE 89 131*  BUN 78* 62*  CREATININE 1.40* 1.65*  CALCIUM  --  10.1   Liver Function Tests:  Recent Labs Lab 10/03/16 1101  AST 20  ALT 14  ALKPHOS 61  BILITOT 1.0  PROT 7.3  ALBUMIN 4.2   No results for input(s): LIPASE, AMYLASE in the last 168 hours. No  results for input(s): AMMONIA in the last 168 hours. CBC:  Recent Labs Lab 09/27/16 1651 09/27/16 1706 10/03/16 1101  WBC 4.3  --  9.0  NEUTROABS 2.6  --  7.2  HGB 12.9 13.3 14.4  HCT 38.5 39.0 41.3  MCV 95.1  --  90.6  PLT 293  --  276   Cardiac Enzymes:  Recent Labs Lab 10/03/16 1101  CKTOTAL 306*   BNP: Invalid input(s): POCBNP CBG: No results for input(s): GLUCAP in the last 168 hours.  Radiological Exams on Admission:  No results found.  *I have personally reviewed the images above*  EKG: Independently reviewed. See above   Assessment/Plan Principal Problem:   Sepsis (HCC)With hypotension, hypothermia, possible hypovolemia due to recent significant diarrhea,  lactic acidosis, septic shock - Placed on sepsis protocol, possible UTI, repeat UA - Obtain blood cultures, repeat lactic acid, placed on IV Rocephin, IV fluids - At the time of my examination, patient had multiple BP readings in 70s. Discussed in detail with the family regarding goals of care. Both son and daughter are in agreement that given her age and overall frail health, they do not want to pursue aggressive measures or any vasopressors. At this point they want to continue IV fluids and antibiotics. If no significant improvement in next 24-48 hours, they are open to comfort care goals. She will be DNR, DNI, no code blue, no vasopressors.       Active Problems:    Acute kidney injury (HCC) - Likely due to #1 and rhabdomyolysis - Placed on gentle hydration, follow creatinine function closely    Fall - Per family, she has been having recent falls however imagings are negative for any fractures or dislocations  - If she survives this hospitalization, they are interested in pursuing skilled nursing facility - PTOT evaluation when stable    Elevated troponin - Trend serial cardiac enzymes, obtain 2-D echocardiogram    Lactic acidosis, Rhabdomyolysis - Continue IV fluid hydration    Dementia -  Continue Namenda, Aricept    recent diarrhea - Per family that has resolved  DVT prophylaxis:  heparin subcutaneous   CODE STATUS:  DNR/DNI   Consults called:  none   Family Communication: Admission, patients condition and plan of care including tests being ordered have been discussed with the patient's son, daughter who indicates understanding and agree with the plan and Code Status  Admission status: inpatient  Disposition plan: Further plan will depend as patient's clinical course evolves and further radiologic and laboratory data become available. Likely skilled nursing facility.   At the time of admission, it appears that the appropriate admission status for this patient is INPATIENT . This is judged to be reasonable and necessary in order to provide the required intensity of service to ensure the patient's safety given the presenting symptoms, physical exam findings, and initial radiographic and laboratory data in the context of their chronic comorbidities.    Time Spent on Admission: 60mins   Cheo Selvey M.D. [facility] 10/03/2016, 3:18 PM Pager: [phone]  If 7PM-7AM, please contact night-coverage [email or web address] Password [email or web address]

## 2016-10-03 NOTE — Progress Notes (Signed)
Orthopedic Tech Progress Note Patient Details:  Lori [name] 03/20/1920 [id number]  Ortho Devices Type of Ortho Device: Soft collar Ortho Device/Splint Location: Neck Ortho Device/Splint Interventions: Application   Clois Dupesvery S Evana Runnels 10/03/2016, 11:01 AM

## 2016-10-03 NOTE — ED Triage Notes (Signed)
Pt with unwitnessed fall. Found on the floor this morning and patient c/o lower back / pelvis pain. Pt states she thinks she fell last night. EMS reports patient had a fall yesterday during the day as well per NH staff. Pt with hx of alzheimers. Arrived alert and oriented to self and place.

## 2016-10-03 NOTE — ED Notes (Signed)
Bed: WA05 Expected date:  Expected time:  Means of arrival:  Comments: EMS-fall 

## 2016-10-03 NOTE — ED Provider Notes (Signed)
[facility]-EMERGENCY DEPT Provider Note   CSN: [id number] Arrival date & time: 10/03/16  [id number]   LEVEL 5 CAVEAT - DEMENTIA  History   Chief Complaint Chief Complaint  Patient presents with  . Fall  . Back Pain    HPI Lori [name] is a 82 y.o. female.  HPI  82 year old female sent in from the nursing home after being found on the floor in her room. Per EMS, the patient fell yesterday but was not sent to the ER. Was last seen last night. Still he thinks she fell sometime in the middle the night. Patient complaining of low back pain. Patient does not remember falling and denies any acute complaints. She is noticed to be in A. fib, she does not know if she has this in the past and does not currently feel any palpitations and has not felt palpitations to her knowledge. Denies chest pain or shortness of breath.  Past Medical History:  Diagnosis Date  . Cataract    bilateral  . Hearing loss   . Memory change   . Migraine   . Mitral stenosis   . Osteoporosis   . Pneumonia   . UTI (urinary tract infection)     Patient Active Problem List   Diagnosis Date Noted  . Septic shock (HCC) 10/03/2016  . Sepsis (HCC) 10/03/2016  . Acute kidney injury (HCC) 10/03/2016  . Fall 10/03/2016  . Elevated troponin 10/03/2016  . Lactic acidosis 10/03/2016  . Rhabdomyolysis 10/03/2016  . Dementia 10/03/2016  . Acute cystitis 01/14/2015  . Tremor, essential 06/30/2014  . Inflamed skin tag 09/01/2013  . Memory change   . Hallucinations 01/03/2013  . UTI (urinary tract infection)   . Rhinitis, nonallergic, chronic 11/22/2012  . Pernicious anemia 11/18/2012  . Neuropathy (HCC) 11/18/2012  . Corn of toe 11/18/2012  . Hypertension 06/13/2012  . Chronic obstructive airway disease with asthma (HCC) 10/26/2010  . CERUMEN IMPACTION, BILATERAL 10/05/2008  . BACK PAIN, ACUTE 01/10/2008  . MIGRAINE HEADACHE 07/23/2007  . LOSS, HEARING NEC 07/23/2007  . STENOSIS, MITRAL 07/23/2007  .  OSTEOPOROSIS 07/23/2007  . EDEMA 07/23/2007    Past Surgical History:  Procedure Laterality Date  . ABDOMINAL HYSTERECTOMY    . APPENDECTOMY    . BILATERAL SALPINGOOPHORECTOMY    . EYE SURGERY     bilateral  . TUBAL LIGATION      OB History    No data available       Home Medications    Prior to Admission medications   Medication Sig Start Date End Date Taking? Authorizing Provider  donepezil (ARICEPT) 10 MG tablet Take 1 tablet (10 mg total) by mouth daily. 07/31/16  Yes Levert FeinsteinYijun Yan, MD  guaiFENesin (MUCINEX) 600 MG 12 hr tablet Take 600 mg by mouth 2 (two) times daily.   Yes Historical Provider, MD  loperamide (IMODIUM) 2 MG capsule Take 1 capsule (2 mg total) by mouth 4 (four) times daily as needed for diarrhea or loose stools. Patient taking differently: Take 2 mg by mouth every 6 (six) hours as needed for diarrhea or loose stools.  09/27/16  Yes Mancel BaleElliott Wentz, MD  memantine (NAMENDA) 5 MG tablet Take 5-10 mg by mouth 2 (two) times daily. Takes 5 mg in the morning and 10 mg at bedtime   Yes Historical Provider, MD  cyanocobalamin (,VITAMIN B-12,) 1000 MCG/ML injection Inject 1 mL (1,000 mcg total) into the muscle every 30 (thirty) days. Patient not taking: Reported on 10/03/2016 10/04/15   Tinnie GensJeffrey  Shawnie DapperA Todd, MD  memantine (NAMENDA) 10 MG tablet Take 1 tablet (10 mg total) by mouth 2 (two) times daily. Patient not taking: Reported on 10/03/2016 07/31/16   Levert FeinsteinYijun Yan, MD  sulfamethoxazole-trimethoprim (BACTRIM DS,SEPTRA DS) 800-160 MG tablet Take 1 tablet by mouth 2 (two) times daily. Patient not taking: Reported on 10/03/2016 07/07/16   Shirline Freesory Nafziger, NP    Family History Family History  Problem Relation Age of Onset  . Heart Problems Mother   . Heart Problems Father     Social History Social History  Substance Use Topics  . Smoking status: Former Smoker    Packs/day: 0.50    Years: 24.00    Types: Cigarettes    Quit date: 11/13/1965  . Smokeless tobacco: Never Used  .  Alcohol use 0.6 oz/week    1 Glasses of wine per week     Comment: occ     Allergies   Patient has no known allergies.   Review of Systems Review of Systems  Unable to perform ROS: Dementia     Physical Exam Updated Vital Signs BP 126/75   Pulse 78   Temp (!) 94.4 F (34.7 C) (Axillary) Comment: will attempt again, once she has finished eating  Resp (!) 24   Ht 5' 2.5" (1.588 m)   Wt 93 lb 7.6 oz (42.4 kg)   SpO2 97%   BMI 16.82 kg/m   Physical Exam  Constitutional: She appears well-developed and well-nourished.  HENT:  Head: Normocephalic and atraumatic.  Right Ear: External ear normal.  Left Ear: External ear normal.  Nose: Nose normal.  Eyes: EOM are normal. Pupils are equal, round, and reactive to light. Right eye exhibits no discharge. Left eye exhibits no discharge.  Neck: Normal range of motion. Neck supple. Spinous process tenderness (mild) present.  Neck wrapped with towel  Cardiovascular: Normal heart sounds.  An irregular rhythm present. Tachycardia present.   Pulmonary/Chest: Effort normal and breath sounds normal. She exhibits no tenderness.  Abdominal: Soft. There is tenderness (lower).  Musculoskeletal:       Right hip: She exhibits tenderness (right thigh). She exhibits normal range of motion.       Left hip: She exhibits normal range of motion and no tenderness.       Cervical back: She exhibits tenderness.       Thoracic back: She exhibits no tenderness.       Lumbar back: She exhibits tenderness.  Neurological: She is alert. She is disoriented.  Skin: Skin is warm and dry.  Nursing note and vitals reviewed.    ED Treatments / Results  Labs (all labs ordered are listed, but only abnormal results are displayed) Labs Reviewed  COMPREHENSIVE METABOLIC PANEL - Abnormal; Notable for the following:       Result Value   CO2 21 (*)    Glucose, Bld 131 (*)    BUN 62 (*)    Creatinine, Ser 1.65 (*)    GFR calc non Af Amer 25 (*)    GFR calc Af  Amer 30 (*)    All other components within normal limits  URINALYSIS, ROUTINE W REFLEX MICROSCOPIC (NOT AT [facility]) - Abnormal; Notable for the following:    APPearance TURBID (*)    Protein, ur 100 (*)    All other components within normal limits  CK - Abnormal; Notable for the following:    Total CK 306 (*)    All other components within normal limits  URINE MICROSCOPIC-ADD ON -  Abnormal; Notable for the following:    Squamous Epithelial / LPF 0-5 (*)    Bacteria, UA MANY (*)    All other components within normal limits  I-STAT CG4 LACTIC ACID, ED - Abnormal; Notable for the following:    Lactic Acid, Venous 2.12 (*)    All other components within normal limits  I-STAT TROPOININ, ED - Abnormal; Notable for the following:    Troponin i, poc 0.24 (*)    All other components within normal limits  CULTURE, BLOOD (ROUTINE X 2)  CULTURE, BLOOD (ROUTINE X 2)  URINE CULTURE  MRSA PCR SCREENING  CBC WITH DIFFERENTIAL/PLATELET  I-STAT CG4 LACTIC ACID, ED    EKG  EKG Interpretation  Date/Time:  Tuesday October 03 2016 10:13:43 EST Ventricular Rate:  122 PR Interval:    QRS Duration: 89 QT Interval:  303 QTC Calculation: 432 R Axis:   80 Text Interpretation:  Atrial fibrillation with rapid ventricular response Ventricular premature complex Probable LVH with secondary repol abnrm ST depression, probably rate related Confirmed by Jerrie Gullo MD, Emori Mumme [phone]) on 10/03/2016 10:23:34 AM       Radiology Dg Chest 2 View  Result Date: 10/03/2016 CLINICAL DATA:  Recent fall EXAM: CHEST  2 VIEW COMPARISON:  11/22/2012 FINDINGS: Cardiac shadow is stable. Calcified granuloma is again noted in the right lower lobe. Bony structures are within normal limits. No pneumothorax is seen. No focal infiltrate or sizable effusion is noted. IMPRESSION: Chronic changes without acute abnormality. Electronically Signed   By: Alcide Clever M.D.   On: 10/03/2016 11:40   Dg Lumbar Spine Complete  Result Date:  10/03/2016 CLINICAL DATA:  Recent fall with low back pain, initial encounter EXAM: LUMBAR SPINE - COMPLETE 4+ VIEW COMPARISON:  None. FINDINGS: Five lumbar type vertebral bodies are well visualized. Vertebral body height is well maintained. Mild degenerative anterolisthesis of L5 on S1 is seen. No definitive pars defects are noted. Diffuse aortic calcifications are seen. IMPRESSION: Degenerative change with mild anterolisthesis. No acute abnormality is noted. Electronically Signed   By: Alcide Clever M.D.   On: 10/03/2016 11:42   Ct Head Wo Contrast  Result Date: 10/03/2016 CLINICAL DATA:  Pt with unwitnessed fall. Found on the floor this morning and patient c/o lower back / pelvis pain. Pt states she thinks she fell last night. EMS reports patient had a fall yesterday during the day as well per NH staff. Pt with hx of alzheimers EXAM: CT HEAD WITHOUT CONTRAST CT CERVICAL SPINE WITHOUT CONTRAST TECHNIQUE: Multidetector CT imaging of the head and cervical spine was performed following the standard protocol without intravenous contrast. Multiplanar CT image reconstructions of the cervical spine were also generated. COMPARISON:  Brain MRI 03/12/2013, head CT 12/31/2012 FINDINGS: CT HEAD FINDINGS Brain: No acute intracranial hemorrhage. No focal mass lesion. No CT evidence of acute infarction. No midline shift or mass effect. No hydrocephalus. Basilar cisterns are patent. There are periventricular and subcortical white matter hypodensities. Generalized cortical atrophy. Vascular: No hyperdense vessel or unexpected calcification. Skull: Normal. Negative for fracture or focal lesion. Sinuses/Orbits: No acute finding. Other: Neck CT 2009.  Chronic CT 2000 CT CERVICAL SPINE FINDINGS Alignment: Normal. Skull base and vertebrae: Normal craniocervical junction. Normal alignment of vertebral bodies. Normal articulation mild degenerate spurring. Soft tissues and spinal canal: No prevertebral fluid or swelling. No visible  canal hematoma. Disc levels:  Unremarkable Upper chest: There is biapical partially calcified apical thickening which appears chronic benign Other: Small amount of gas along the LEFT  C6 transverse foramen is felt to relate to venipuncture (image 31, series 13). IMPRESSION: 1. No intracranial trauma. 2. Atrophy and microvascular disease advanced from 2009. 3. No cervical spine fracture 4. Biapical calcified pleural parenchymal scarring appears benign. Electronically Signed   By: Genevive Bi M.D.   On: 10/03/2016 12:20   Ct Cervical Spine Wo Contrast  Result Date: 10/03/2016 CLINICAL DATA:  Pt with unwitnessed fall. Found on the floor this morning and patient c/o lower back / pelvis pain. Pt states she thinks she fell last night. EMS reports patient had a fall yesterday during the day as well per NH staff. Pt with hx of alzheimers EXAM: CT HEAD WITHOUT CONTRAST CT CERVICAL SPINE WITHOUT CONTRAST TECHNIQUE: Multidetector CT imaging of the head and cervical spine was performed following the standard protocol without intravenous contrast. Multiplanar CT image reconstructions of the cervical spine were also generated. COMPARISON:  Brain MRI 03/12/2013, head CT 12/31/2012 FINDINGS: CT HEAD FINDINGS Brain: No acute intracranial hemorrhage. No focal mass lesion. No CT evidence of acute infarction. No midline shift or mass effect. No hydrocephalus. Basilar cisterns are patent. There are periventricular and subcortical white matter hypodensities. Generalized cortical atrophy. Vascular: No hyperdense vessel or unexpected calcification. Skull: Normal. Negative for fracture or focal lesion. Sinuses/Orbits: No acute finding. Other: Neck CT 2009.  Chronic CT 2000 CT CERVICAL SPINE FINDINGS Alignment: Normal. Skull base and vertebrae: Normal craniocervical junction. Normal alignment of vertebral bodies. Normal articulation mild degenerate spurring. Soft tissues and spinal canal: No prevertebral fluid or swelling. No  visible canal hematoma. Disc levels:  Unremarkable Upper chest: There is biapical partially calcified apical thickening which appears chronic benign Other: Small amount of gas along the LEFT C6 transverse foramen is felt to relate to venipuncture (image 31, series 13). IMPRESSION: 1. No intracranial trauma. 2. Atrophy and microvascular disease advanced from 2009. 3. No cervical spine fracture 4. Biapical calcified pleural parenchymal scarring appears benign. Electronically Signed   By: Genevive Bi M.D.   On: 10/03/2016 12:20   Dg Hip Unilat W Or Wo Pelvis 2-3 Views Right  Result Date: 10/03/2016 CLINICAL DATA:  Recent fall with right hip pain, initial encounter EXAM: DG HIP (WITH OR WITHOUT PELVIS) 2-3V RIGHT COMPARISON:  None. FINDINGS: The pelvic ring is intact. No acute fracture or dislocation is seen. No gross soft tissue abnormality is noted. IMPRESSION: No acute abnormality noted. Electronically Signed   By: Alcide Clever M.D.   On: 10/03/2016 11:41   Dg Femur Min 2 Views Right  Result Date: 10/03/2016 CLINICAL DATA:  Recent fall with right leg pain, initial encounter EXAM: RIGHT FEMUR 2 VIEWS COMPARISON:  None. FINDINGS: There is no evidence of fracture or other focal bone lesions. Soft tissues are unremarkable. IMPRESSION: No acute abnormality noted. Electronically Signed   By: Alcide Clever M.D.   On: 10/03/2016 11:42    Procedures Procedures (including critical care time)  CRITICAL CARE Performed by: Pricilla Loveless T   Total critical care time: 30 minutes  Critical care time was exclusive of separately billable procedures and treating other patients.  Critical care was necessary to treat or prevent imminent or life-threatening deterioration.  Critical care was time spent personally by me on the following activities: development of treatment plan with patient and/or surrogate as well as nursing, discussions with consultants, evaluation of patient's response to treatment,  examination of patient, obtaining history from patient or surrogate, ordering and performing treatments and interventions, ordering and review of laboratory studies, ordering and  review of radiographic studies, pulse oximetry and re-evaluation of patient's condition.   Medications Ordered in ED Medications  donepezil (ARICEPT) tablet 10 mg (not administered)  loperamide (IMODIUM) capsule 2 mg (not administered)  0.9 %  sodium chloride infusion ( Intravenous New Bag/Given 10/03/16 1511)  acetaminophen (TYLENOL) tablet 650 mg (not administered)    Or  acetaminophen (TYLENOL) suppository 650 mg (not administered)  ondansetron (ZOFRAN) tablet 4 mg (not administered)    Or  ondansetron (ZOFRAN) injection 4 mg (not administered)  heparin injection 5,000 Units (not administered)  sodium chloride flush (NS) 0.9 % injection 3 mL (not administered)  cefTRIAXone (ROCEPHIN) 1 g in dextrose 5 % 50 mL IVPB (not administered)  memantine (NAMENDA) tablet 5 mg (not administered)  memantine (NAMENDA) tablet 10 mg (not administered)  sodium chloride 0.9 % bolus 1,000 mL (0 mLs Intravenous Stopped 10/03/16 1200)  fentaNYL (SUBLIMAZE) injection 25 mcg (25 mcg Intravenous Given 10/03/16 1220)  cefTRIAXone (ROCEPHIN) 1 g in dextrose 5 % 50 mL IVPB (0 g Intravenous Stopped 10/03/16 1244)  sodium chloride 0.9 % bolus 1,000 mL (1,000 mLs Intravenous New Bag/Given 10/03/16 1229)     Initial Impression / Assessment and Plan / ED Course  I have reviewed the triage vital signs and the nursing notes.  Pertinent labs & imaging results that were available during my care of the patient were reviewed by me and considered in my medical decision making (see chart for details).  Clinical Course as of Oct 03 1524  Tue Oct 03, 2016  1035 No acute distress. Will xray hip, femur, lumbar spine and get ct head/c-spine given mild neck tenderness. Unclear if afib is new, and if it is there is no clear onset. Fluids, infectious  workup. If she still have abd tenderness after I/O cath, will get CT.  [SG]  1236 Discussed with Dr. Isidoro Donning, BP has now dropped to 70s during consultation for admission. She requests more fluids and stabilization of BP. If it improves, she will admit, otherwise need to consult CCM. Has only received 1L so far.  [SG]  1252 I have discussed further with the family, and they would not want any type of life support including no pressors. If her blood pressure does not turn around with fluids and antibiotics they would rather go down the comfort care route. Discussed again with the hospitalist, Dr. Isidoro Donning, who will admit to step down.  [SG]    Clinical Course User Index [SG] Pricilla Loveless, MD    Final Clinical Impressions(s) / ED Diagnoses   Final diagnoses:  Septic shock (HCC)  Acute kidney injury [facility])  Atrial fibrillation with [facility])    New Prescriptions Current Discharge Medication List       Pricilla Loveless, MD 10/03/16 1525

## 2016-10-03 NOTE — ED Notes (Signed)
MD made aware of critical troponin value

## 2016-10-04 ENCOUNTER — Inpatient Hospital Stay (HOSPITAL_COMMUNITY): Payer: Medicare Other

## 2016-10-04 DIAGNOSIS — I369 Nonrheumatic tricuspid valve disorder, unspecified: Secondary | ICD-10-CM

## 2016-10-04 DIAGNOSIS — E872 Acidosis: Secondary | ICD-10-CM

## 2016-10-04 DIAGNOSIS — N179 Acute kidney failure, unspecified: Secondary | ICD-10-CM

## 2016-10-04 DIAGNOSIS — R748 Abnormal levels of other serum enzymes: Secondary | ICD-10-CM

## 2016-10-04 LAB — BASIC METABOLIC PANEL
ANION GAP: 5 (ref 5–15)
BUN: 40 mg/dL — AB (ref 6–20)
CHLORIDE: 117 mmol/L — AB (ref 101–111)
CO2: 18 mmol/L — ABNORMAL LOW (ref 22–32)
Calcium: 8.1 mg/dL — ABNORMAL LOW (ref 8.9–10.3)
Creatinine, Ser: 0.95 mg/dL (ref 0.44–1.00)
GFR calc Af Amer: 58 mL/min — ABNORMAL LOW (ref >60)
GFR, EST NON AFRICAN AMERICAN: 50 mL/min — AB (ref >60)
GLUCOSE: 92 mg/dL (ref 65–99)
POTASSIUM: 3.3 mmol/L — AB (ref 3.5–5.1)
Sodium: 140 mmol/L (ref 135–145)

## 2016-10-04 LAB — CBC
HEMATOCRIT: 30.1 % — AB (ref 36.0–46.0)
HEMOGLOBIN: 10.3 g/dL — AB (ref 12.0–15.0)
MCH: 31.9 pg (ref 26.0–34.0)
MCHC: 34.2 g/dL (ref 30.0–36.0)
MCV: 93.2 fL (ref 78.0–100.0)
Platelets: 215 10*3/uL (ref 150–400)
RBC: 3.23 MIL/uL — ABNORMAL LOW (ref 3.87–5.11)
RDW: 13.4 % (ref 11.5–15.5)
WBC: 5.7 10*3/uL (ref 4.0–10.5)

## 2016-10-04 LAB — TROPONIN I: TROPONIN I: 0.18 ng/mL — AB (ref <0.03)

## 2016-10-04 LAB — ECHOCARDIOGRAM COMPLETE
Height: 62.5 in
Weight: 1495.6 oz

## 2016-10-04 LAB — CK: CK TOTAL: 209 U/L (ref 38–234)

## 2016-10-04 LAB — LACTIC ACID, PLASMA: Lactic Acid, Venous: 0.7 mmol/L (ref 0.5–1.9)

## 2016-10-04 MED ORDER — SODIUM CHLORIDE 0.9 % IV BOLUS (SEPSIS)
500.0000 mL | Freq: Once | INTRAVENOUS | Status: AC
  Administered 2016-10-04: 500 mL via INTRAVENOUS

## 2016-10-04 MED ORDER — POTASSIUM CHLORIDE CRYS ER 20 MEQ PO TBCR
40.0000 meq | EXTENDED_RELEASE_TABLET | Freq: Once | ORAL | Status: AC
  Administered 2016-10-04: 40 meq via ORAL
  Filled 2016-10-04 (×2): qty 2

## 2016-10-04 NOTE — Care Management Note (Signed)
Case Management Note  Patient Details  Name: Lori [name] MRN: [id number] Date of Birth: 04/15/1920  Subjective/Objective:          hypotensive state          [facility]Plan[facility]  Date:  October 04, 2016 Chart reviewed for concurrent status and case management needs. Will continue to follow patient progress. Discharge Planning: following for needs Expected discharge date: [id number] Marcelle SmilingRhonda Davis, BSN, [place], [place]   [phone] Expected Discharge Date:   (UNKNOWN)               Expected Discharge Plan:  Assisted Living / Rest Home  In-House Referral:  Clinical Social Work  Discharge planning Services  CM Consult  Post Acute Care Choice:    Choice offered to:     DME Arranged:    DME Agency:     HH Arranged:    HH Agency:     Status of Service:  In process, will continue to follow  If discussed at Long Length of Stay Meetings, dates discussed:    Additional Comments:  [name], [name], RN 10/04/2016, 10:47 AM

## 2016-10-04 NOTE — Progress Notes (Signed)
PROGRESS NOTE    Lori [name]  JYN:[id number] DOB: 07/11/1920 DOA: 10/03/2016 PCP: Evette GeorgesDD,JEFFREY ALLEN, MD    Brief Narrative:  82 year old female with dementia, hearing loss, osteoporosis, hypertension, back pain and [facility] after she was found on the floor in her room. History was obtained from the patient's daughter on the phone and the son at the bedside. Patient's daughter reported that she was called when the patient was found on the floor. They do not know how long she was on the floor. Patient could not recall any events. She may have fallen sometime in the middle of the night. Patient's daughter reported that she had lot of diarrhea over the last 1 week and was significantly dehydrated, poor appetite, had been complaining of low back pain whenever she sat down. She had low-grade fevers in the last few days. Patient had been complaining of dysuria and increasing frequency of urination. Patient had no chest pain or shortness of breath. No vomiting. Patient herself was not able to provide any history.  Assessment & Plan:   Principal Problem:   Sepsis (HCC) Active Problems:   UTI (urinary tract infection)   Septic shock (HCC)   Acute kidney injury (HCC)   Fall   Elevated troponin   Lactic acidosis   Rhabdomyolysis   Dementia     Sepsis present on admission (HCC)With hypotension, hypothermia, possible hypovolemia due to recent significant diarrhea, lactic acidosis, septic shock - Suspicion for UTI - Lactate normalized. Was over 2 on admission - Urine culture pending - Patient presented hypotensive initially. Blood pressures overnight reviewed - have remained stable - Will check CBC in AM    Acute kidney injury (HCC) - Likely secondary to presenting rhabdo with sepsis - Cr has improved overnight with IVF - Repeat BMET in AM    Fall - No fractures noted at time of presentation - Stable at present    Elevated  troponin - Serial trop stable at around 0.18 overnight - EKG reviewed. Stable - 2d echo performed. Awaiting results    Lactic acidosis, Rhabdomyolysis - Patient continued on IVF hydration - CK trending down. - Repeat CK in AM    Dementia - Appears stable at present - Continue Namenda, Aricept    Recent diarrhea - Per family that has resolved - Stable currently - Repeat BMET in AM  DVT prophylaxis: Heparin subq Code Status: DNR Family Communication: Pt in room, family not at bedside Disposition Plan: Likely SNF at time of discharge  Consultants:     Procedures:     Antimicrobials: Anti-infectives    Start     Dose/Rate Route Frequency Ordered Stop   10/04/16 1200  cefTRIAXone (ROCEPHIN) 1 g in dextrose 5 % 50 mL IVPB     1 g 100 mL/hr over 30 Minutes Intravenous Every 24 hours 10/03/16 1357     10/03/16 1200  cefTRIAXone (ROCEPHIN) 1 g in dextrose 5 % 50 mL IVPB     1 g 100 mL/hr over 30 Minutes Intravenous  Once 10/03/16 1155 10/03/16 1244      Subjective: No complaints  Objective: Vitals:   10/04/16 0637 10/04/16 0700 10/04/16 0800 10/04/16 1200  BP: 112/68 (!) 99/47  (!) 105/44  Pulse: 79 65  77  Resp: 20 14  16   Temp:   98.2 F (36.8 C) 97.4 F (36.3 C)  TempSrc:   Axillary Axillary  SpO2: 94% 92%  96%  Weight:  Height:        Intake/Output Summary (Last 24 hours) at 10/04/16 1414 Last data filed at 10/04/16 1200  Gross per 24 hour  Intake          3591.67 ml  Output             1250 ml  Net          2341.67 ml   Filed Weights   10/03/16 1401  Weight: 42.4 kg (93 lb 7.6 oz)    Examination:  General exam: Appears calm and comfortable  Respiratory system: Clear to auscultation. Respiratory effort normal. Cardiovascular system: S1 & S2 heard, RRR. No JVD, murmurs, rubs, gallops or clicks. No pedal edema. Gastrointestinal system: Abdomen is nondistended, soft and nontender. No organomegaly or masses felt. Normal bowel sounds  heard. Central nervous system: Alert and oriented. No focal neurological deficits. Extremities: Symmetric 5 x 5 power. Skin: No rashes, lesions Psychiatry: Somewhat confused. Mood & affect appropriate.   Data Reviewed: I have personally reviewed following labs and imaging studies  CBC:  Recent Labs Lab 09/27/16 1651 09/27/16 1706 10/03/16 1101 10/04/16 0308  WBC 4.3  --  9.0 5.7  NEUTROABS 2.6  --  7.2  --   HGB 12.9 13.3 14.4 10.3*  HCT 38.5 39.0 41.3 30.1*  MCV 95.1  --  90.6 93.2  PLT 293  --  276 215   Basic Metabolic Panel:  Recent Labs Lab 09/27/16 1706 10/03/16 1101 10/04/16 0308  NA 144 140 140  K 4.4 3.9 3.3*  CL 107 107 117*  CO2  --  21* 18*  GLUCOSE 89 131* 92  BUN 78* 62* 40*  CREATININE 1.40* 1.65* 0.95  CALCIUM  --  10.1 8.1*   GFR: Estimated Creatinine Clearance: 24.2 mL/min (by C-G formula based on SCr of 0.95 mg/dL). Liver Function Tests:  Recent Labs Lab 10/03/16 1101  AST 20  ALT 14  ALKPHOS 61  BILITOT 1.0  PROT 7.3  ALBUMIN 4.2   No results for input(s): LIPASE, AMYLASE in the last 168 hours. No results for input(s): AMMONIA in the last 168 hours. Coagulation Profile: No results for input(s): INR, PROTIME in the last 168 hours. Cardiac Enzymes:  Recent Labs Lab 10/03/16 1101 10/03/16 1546 10/03/16 2115 10/04/16 0308 10/04/16 0850  CKTOTAL 306*  --   --   --  209  TROPONINI  --  0.18* 0.19* 0.18*  --    BNP (last 3 results) No results for input(s): PROBNP in the last 8760 hours. HbA1C: No results for input(s): HGBA1C in the last 72 hours. CBG: No results for input(s): GLUCAP in the last 168 hours. Lipid Profile: No results for input(s): CHOL, HDL, LDLCALC, TRIG, CHOLHDL, LDLDIRECT in the last 72 hours. Thyroid Function Tests: No results for input(s): TSH, T4TOTAL, FREET4, T3FREE, THYROIDAB in the last 72 hours. Anemia Panel: No results for input(s): VITAMINB12, FOLATE, FERRITIN, TIBC, IRON, RETICCTPCT in the last 72  hours. Sepsis Labs:  Recent Labs Lab 10/03/16 1111 10/04/16 0850  LATICACIDVEN 2.12* 0.7    Recent Results (from the past 240 hour(s))  Urine culture     Status: None (Preliminary result)   Collection Time: 10/03/16 10:48 AM  Result Value Ref Range Status   Specimen Description URINE, CLEAN CATCH  Final   Special Requests NONE  Final   Culture   Final    CULTURE REINCUBATED FOR BETTER GROWTH Performed at [facility]    Report Status PENDING  Incomplete  MRSA PCR Screening     Status: None   Collection Time: 10/03/16  2:12 PM  Result Value Ref Range Status   MRSA by PCR NEGATIVE NEGATIVE Final    Comment:        The GeneXpert MRSA Assay (FDA approved for NASAL specimens only), is one component of a comprehensive MRSA colonization surveillance program. It is not intended to diagnose MRSA infection nor to guide or monitor treatment for MRSA infections.      Radiology Studies: Dg Chest 2 View  Result Date: 10/03/2016 CLINICAL DATA:  Recent fall EXAM: CHEST  2 VIEW COMPARISON:  11/22/2012 FINDINGS: Cardiac shadow is stable. Calcified granuloma is again noted in the right lower lobe. Bony structures are within normal limits. No pneumothorax is seen. No focal infiltrate or sizable effusion is noted. IMPRESSION: Chronic changes without acute abnormality. Electronically Signed   By: Alcide CleverMark  Lukens M.D.   On: 10/03/2016 11:40   Dg Lumbar Spine Complete  Result Date: 10/03/2016 CLINICAL DATA:  Recent fall with low back pain, initial encounter EXAM: LUMBAR SPINE - COMPLETE 4+ VIEW COMPARISON:  None. FINDINGS: Five lumbar type vertebral bodies are well visualized. Vertebral body height is well maintained. Mild degenerative anterolisthesis of L5 on S1 is seen. No definitive pars defects are noted. Diffuse aortic calcifications are seen. IMPRESSION: Degenerative change with mild anterolisthesis. No acute abnormality is noted. Electronically Signed   By: Alcide CleverMark  Lukens M.D.   On:  10/03/2016 11:42   Ct Head Wo Contrast  Result Date: 10/03/2016 CLINICAL DATA:  Pt with unwitnessed fall. Found on the floor this morning and patient c/o lower back / pelvis pain. Pt states she thinks she fell last night. EMS reports patient had a fall yesterday during the day as well per NH staff. Pt with hx of alzheimers EXAM: CT HEAD WITHOUT CONTRAST CT CERVICAL SPINE WITHOUT CONTRAST TECHNIQUE: Multidetector CT imaging of the head and cervical spine was performed following the standard protocol without intravenous contrast. Multiplanar CT image reconstructions of the cervical spine were also generated. COMPARISON:  Brain MRI 03/12/2013, head CT 12/31/2012 FINDINGS: CT HEAD FINDINGS Brain: No acute intracranial hemorrhage. No focal mass lesion. No CT evidence of acute infarction. No midline shift or mass effect. No hydrocephalus. Basilar cisterns are patent. There are periventricular and subcortical white matter hypodensities. Generalized cortical atrophy. Vascular: No hyperdense vessel or unexpected calcification. Skull: Normal. Negative for fracture or focal lesion. Sinuses/Orbits: No acute finding. Other: Neck CT 2009.  Chronic CT 2000 CT CERVICAL SPINE FINDINGS Alignment: Normal. Skull base and vertebrae: Normal craniocervical junction. Normal alignment of vertebral bodies. Normal articulation mild degenerate spurring. Soft tissues and spinal canal: No prevertebral fluid or swelling. No visible canal hematoma. Disc levels:  Unremarkable Upper chest: There is biapical partially calcified apical thickening which appears chronic benign Other: Small amount of gas along the LEFT C6 transverse foramen is felt to relate to venipuncture (image 31, series 13). IMPRESSION: 1. No intracranial trauma. 2. Atrophy and microvascular disease advanced from 2009. 3. No cervical spine fracture 4. Biapical calcified pleural parenchymal scarring appears benign. Electronically Signed   By: Genevive BiStewart  Edmunds M.D.   On:  10/03/2016 12:20   Ct Cervical Spine Wo Contrast  Result Date: 10/03/2016 CLINICAL DATA:  Pt with unwitnessed fall. Found on the floor this morning and patient c/o lower back / pelvis pain. Pt states she thinks she fell last night. EMS reports patient had a fall yesterday during the day as well per NH staff. Pt  with hx of alzheimers EXAM: CT HEAD WITHOUT CONTRAST CT CERVICAL SPINE WITHOUT CONTRAST TECHNIQUE: Multidetector CT imaging of the head and cervical spine was performed following the standard protocol without intravenous contrast. Multiplanar CT image reconstructions of the cervical spine were also generated. COMPARISON:  Brain MRI 03/12/2013, head CT 12/31/2012 FINDINGS: CT HEAD FINDINGS Brain: No acute intracranial hemorrhage. No focal mass lesion. No CT evidence of acute infarction. No midline shift or mass effect. No hydrocephalus. Basilar cisterns are patent. There are periventricular and subcortical white matter hypodensities. Generalized cortical atrophy. Vascular: No hyperdense vessel or unexpected calcification. Skull: Normal. Negative for fracture or focal lesion. Sinuses/Orbits: No acute finding. Other: Neck CT 2009.  Chronic CT 2000 CT CERVICAL SPINE FINDINGS Alignment: Normal. Skull base and vertebrae: Normal craniocervical junction. Normal alignment of vertebral bodies. Normal articulation mild degenerate spurring. Soft tissues and spinal canal: No prevertebral fluid or swelling. No visible canal hematoma. Disc levels:  Unremarkable Upper chest: There is biapical partially calcified apical thickening which appears chronic benign Other: Small amount of gas along the LEFT C6 transverse foramen is felt to relate to venipuncture (image 31, series 13). IMPRESSION: 1. No intracranial trauma. 2. Atrophy and microvascular disease advanced from 2009. 3. No cervical spine fracture 4. Biapical calcified pleural parenchymal scarring appears benign. Electronically Signed   By: Genevive Bi M.D.    On: 10/03/2016 12:20   Dg Hip Unilat W Or Wo Pelvis 2-3 Views Right  Result Date: 10/03/2016 CLINICAL DATA:  Recent fall with right hip pain, initial encounter EXAM: DG HIP (WITH OR WITHOUT PELVIS) 2-3V RIGHT COMPARISON:  None. FINDINGS: The pelvic ring is intact. No acute fracture or dislocation is seen. No gross soft tissue abnormality is noted. IMPRESSION: No acute abnormality noted. Electronically Signed   By: Alcide Clever M.D.   On: 10/03/2016 11:41   Dg Femur Min 2 Views Right  Result Date: 10/03/2016 CLINICAL DATA:  Recent fall with right leg pain, initial encounter EXAM: RIGHT FEMUR 2 VIEWS COMPARISON:  None. FINDINGS: There is no evidence of fracture or other focal bone lesions. Soft tissues are unremarkable. IMPRESSION: No acute abnormality noted. Electronically Signed   By: Alcide Clever M.D.   On: 10/03/2016 11:42    Scheduled Meds: . cefTRIAXone (ROCEPHIN)  IV  1 g Intravenous Q24H  . donepezil  10 mg Oral Daily  . heparin  5,000 Units Subcutaneous Q8H  . memantine  10 mg Oral QHS  . memantine  5 mg Oral Daily  . sodium chloride flush  3 mL Intravenous Q12H   Continuous Infusions: . sodium chloride 100 mL/hr at 10/04/16 1223     LOS: 1 day   Celinda Dethlefs, Scheryl Marten, MD [facility] Pager [phone]  If 7PM-7AM, please contact night-coverage [email or web address] Password [facility] 10/04/2016, 2:14 PM

## 2016-10-04 NOTE — Clinical Social Work Note (Signed)
Clinical Social Work Assessment  Patient Details  Name: Lori [name] MRN: [id number] Date of Birth: May 27, 1920  Date of referral:  10/04/16               Reason for consult:  Facility Placement, Discharge Planning                Permission sought to share information with:  Facility Art therapist granted to share information::  Yes, Verbal Permission Granted  Name::        Agency::     Relationship::     Contact Information:     Housing/Transportation Living arrangements for the past 2 months:  [place] of Information:  Adult Children Patient Interpreter Needed:  None Criminal Activity/Legal Involvement Pertinent to Current Situation/Hospitalization:  No - Comment as needed Significant Relationships:  Adult Children Lives with:  Facility Resident Do you feel safe going back to the place where you live?   (unsure) Need for family participation in patient care:  Yes (Comment)  Care giving concerns:  Family is concerned about pt's multiple falls. Pt's son has met with NSG at ALF to discuss concerns and ways to decrease falls in the future.   Social Worker assessment / plan:  Pt hospitalized on 10/03/16 from [facility] with Sepsis (HCC)With hypotension, hypothermia, possible hypovolemia due to recent significant diarrhea, lactic acidosis, septic shock. Pt has dementia  and cannot participate in d/c planning. CSW spoke with pt's daughter, Lori [name] [phone], to assist with d/c planning. Daughter would like pt to return to ALF at d/c if changes can be made to help reduce falls. PT evaluation will be ordered when medically appropriate. Daughter will consider SNF placement, if recommended. CSW has contacted ALF and clinicals provided. CSW will keep ALF updated and continue assisting with d/c planning.  Employment status:  Retired Nurse, adult PT Recommendations:  Not assessed at this time Information /  Referral to community resources:     Patient/Family's Response to care: Family would like pt to return to ALF if they can manage her care.  Patient/Family's Understanding of and Emotional Response to Diagnosis, Current Treatment, and Prognosis:  Family is aware of pt's medical status. They are hopeful that pt can return to ALF but will consider SNF if recommended. Daughter appreciates CSW assistance with d/c planning.  Emotional Assessment Appearance:  Appears stated age Attitude/Demeanor/Rapport:  Unable to Assess Affect (typically observed):  Unable to Assess Orientation:  Oriented to Self Alcohol / Substance use:  Not Applicable Psych involvement (Current and /or in the community):  No (Comment)  Discharge Needs  Concerns to be addressed:  Discharge Planning Concerns Readmission within the last 30 days:  No Current discharge risk:  None Barriers to Discharge:  No Barriers Identified   Loraine Maple   [phone] 10/04/2016, 1:41 PM

## 2016-10-04 NOTE — Progress Notes (Signed)
  Echocardiogram 2D Echocardiogram has been performed.  Nolon RodBrown, Tony 10/04/2016, 1:08 PM

## 2016-10-04 NOTE — Progress Notes (Signed)
eLink Physician-Brief Progress Note Patient Name: Lori [name] DOB: 09/27/1920 MRN: [id number]   Date of Service  10/04/2016  HPI/Events of Note  x  eICU Interventions  Per RN patient has order to move out of icu     Intervention Category Minor Interventions: Other:  [name] 10/04/2016, 9:48 PM

## 2016-10-05 DIAGNOSIS — M6282 Rhabdomyolysis: Secondary | ICD-10-CM

## 2016-10-05 LAB — CBC
HCT: 30 % — ABNORMAL LOW (ref 36.0–46.0)
Hemoglobin: 10.1 g/dL — ABNORMAL LOW (ref 12.0–15.0)
MCH: 30.3 pg (ref 26.0–34.0)
MCHC: 33.7 g/dL (ref 30.0–36.0)
MCV: 90.1 fL (ref 78.0–100.0)
Platelets: 203 10*3/uL (ref 150–400)
RBC: 3.33 MIL/uL — ABNORMAL LOW (ref 3.87–5.11)
RDW: 13.3 % (ref 11.5–15.5)
WBC: 5.3 10*3/uL (ref 4.0–10.5)

## 2016-10-05 LAB — BASIC METABOLIC PANEL
Anion gap: 5 (ref 5–15)
BUN: 25 mg/dL — ABNORMAL HIGH (ref 6–20)
CO2: 20 mmol/L — ABNORMAL LOW (ref 22–32)
Calcium: 8.1 mg/dL — ABNORMAL LOW (ref 8.9–10.3)
Chloride: 118 mmol/L — ABNORMAL HIGH (ref 101–111)
Creatinine, Ser: 0.71 mg/dL (ref 0.44–1.00)
GFR calc Af Amer: >60 mL/min (ref >60)
GFR calc non Af Amer: >60 mL/min (ref >60)
Glucose, Bld: 91 mg/dL (ref 65–99)
Potassium: 3.3 mmol/L — ABNORMAL LOW (ref 3.5–5.1)
Sodium: 143 mmol/L (ref 135–145)

## 2016-10-05 LAB — CK: Total CK: 111 U/L (ref 38–234)

## 2016-10-05 MED ORDER — POTASSIUM CHLORIDE CRYS ER 20 MEQ PO TBCR
60.0000 meq | EXTENDED_RELEASE_TABLET | Freq: Once | ORAL | Status: AC
  Administered 2016-10-05: 60 meq via ORAL
  Filled 2016-10-05: qty 3

## 2016-10-05 NOTE — Progress Notes (Signed)
PROGRESS NOTE    Lori [name]  ZOX:[id number] DOB: 03-21-1920 DOA: 10/03/2016 PCP: Evette Georges, MD    Brief Narrative:  82 year old female with dementia, hearing loss, osteoporosis, hypertension, back pain and was sent from [facility] assisted living facility after she was found on the floor in her room. History was obtained from the patient's daughter on the phone and the son at the bedside. Patient's daughter reported that she was called when the patient was found on the floor. They do not know how long she was on the floor. Patient could not recall any events. She may have fallen sometime in the middle of the night. Patient's daughter reported that she had lot of diarrhea over the last 1 week and was significantly dehydrated, poor appetite, had been complaining of low back pain whenever she sat down. She had low-grade fevers in the last few days. Patient had been complaining of dysuria and increasing frequency of urination. Patient had no chest pain or shortness of breath. No vomiting. Patient herself was not able to provide any history.  Assessment & Plan:   Principal Problem:   Sepsis (HCC) Active Problems:   UTI (urinary tract infection)   Septic shock (HCC)   Acute kidney injury (HCC)   Fall   Elevated troponin   Lactic acidosis   Rhabdomyolysis   Dementia     Sepsis present on admission (HCC)With hypotension, hypothermia, possible hypovolemia due to recent significant diarrhea, lactic acidosis, septic shock - Likely UTI as etiology of presenting sepsis -Sepsis physiology has resolved - Urine culture reviewed, still pending at this time - Clinically, patient appears to be improving -Patient will continue on Rocephin for now. If urine culture results are inconclusive, consider transition to Ceftin to complete course of antibiotics    Acute kidney injury (HCC) - Likely secondary to presenting rhabdo with sepsis - Labs reviewed. Creatinine has  normalized -Repeat basic metabolic panel morning    Fall - No fractures noted at time of presentation - Appear stable. -Have consulted physical therapy for discharge planning. Currently patient resides at an assisted living facility    Elevated troponin - Serial trop stable at around 0.18 overnight - EKG reviewed. Stable - 2d echo performed. Results reviewed. No wall motion abnormality noted    Lactic acidosis, Rhabdomyolysis - Patient is continued on IV fluids -CK has improved to 111 from 306 at time of admission    Dementia - Appears stable at present - Continue Namenda, Aricept    Recent diarrhea - Per family diarrhea has resolved - Remains stable at present  DVT prophylaxis: Heparin subq Code Status: DNR Family Communication: Pt in room, family not at bedside Disposition Plan: Likely SNF at time of discharge  Consultants:     Procedures:     Antimicrobials: Anti-infectives    Start     Dose/Rate Route Frequency Ordered Stop   10/04/16 1200  cefTRIAXone (ROCEPHIN) 1 g in dextrose 5 % 50 mL IVPB     1 g 100 mL/hr over 30 Minutes Intravenous Every 24 hours 10/03/16 1357     10/03/16 1200  cefTRIAXone (ROCEPHIN) 1 g in dextrose 5 % 50 mL IVPB     1 g 100 mL/hr over 30 Minutes Intravenous  Once 10/03/16 1155 10/03/16 1244      Subjective: Patient is without complaints  Objective: Vitals:   10/04/16 2200 10/04/16 2231 10/05/16 0453 10/05/16 1454  BP:  (!) 144/64 (!) 106/51 (!) 155/66  Pulse:  88 100 79  Resp:  18 18 16   Temp:  97.7 F (36.5 C) 97.5 F (36.4 C) 97.4 F (36.3 C)  TempSrc:  Oral Oral Oral  SpO2:  95% 100% 93%  Weight: 46.5 kg (102 lb 8.2 oz)     Height: 5' 2.5" (1.588 m)       Intake/Output Summary (Last 24 hours) at 10/05/16 1841 Last data filed at 10/05/16 1802  Gross per 24 hour  Intake             3110 ml  Output             3275 ml  Net             -165 ml   Filed Weights   10/03/16 1401 10/04/16 2200  Weight: 42.4 kg  (93 lb 7.6 oz) 46.5 kg (102 lb 8.2 oz)    Examination:  General exam: Lying in bed, no acute distress Respiratory system: Normal respiratory effort, no audible wheezing Cardiovascular system: Regular rate, S1-S2 Gastrointestinal system: Positive bowel sounds, nondistended Central nervous system: CN II through XII grossly intact, sensation intact Extremities: No clubbing, no cyanosis Skin: Normal skin turgor, no rashes Psychiatry: Mood appears normal, no auditory hallucinations  Data Reviewed: I have personally reviewed following labs and imaging studies  CBC:  Recent Labs Lab 10/03/16 1101 10/04/16 0308 10/05/16 0359  WBC 9.0 5.7 5.3  NEUTROABS 7.2  --   --   HGB 14.4 10.3* 10.1*  HCT 41.3 30.1* 30.0*  MCV 90.6 93.2 90.1  PLT 276 215 203   Basic Metabolic Panel:  Recent Labs Lab 10/03/16 1101 10/04/16 0308 10/05/16 0359  NA 140 140 143  K 3.9 3.3* 3.3*  CL 107 117* 118*  CO2 21* 18* 20*  GLUCOSE 131* 92 91  BUN 62* 40* 25*  CREATININE 1.65* 0.95 0.71  CALCIUM 10.1 8.1* 8.1*   GFR: Estimated Creatinine Clearance: 31.6 mL/min (by C-G formula based on SCr of 0.71 mg/dL). Liver Function Tests:  Recent Labs Lab 10/03/16 1101  AST 20  ALT 14  ALKPHOS 61  BILITOT 1.0  PROT 7.3  ALBUMIN 4.2   No results for input(s): LIPASE, AMYLASE in the last 168 hours. No results for input(s): AMMONIA in the last 168 hours. Coagulation Profile: No results for input(s): INR, PROTIME in the last 168 hours. Cardiac Enzymes:  Recent Labs Lab 10/03/16 1101 10/03/16 1546 10/03/16 2115 10/04/16 0308 10/04/16 0850 10/05/16 0359  CKTOTAL 306*  --   --   --  209 111  TROPONINI  --  0.18* 0.19* 0.18*  --   --    BNP (last 3 results) No results for input(s): PROBNP in the last 8760 hours. HbA1C: No results for input(s): HGBA1C in the last 72 hours. CBG: No results for input(s): GLUCAP in the last 168 hours. Lipid Profile: No results for input(s): CHOL, HDL, LDLCALC,  TRIG, CHOLHDL, LDLDIRECT in the last 72 hours. Thyroid Function Tests: No results for input(s): TSH, T4TOTAL, FREET4, T3FREE, THYROIDAB in the last 72 hours. Anemia Panel: No results for input(s): VITAMINB12, FOLATE, FERRITIN, TIBC, IRON, RETICCTPCT in the last 72 hours. Sepsis Labs:  Recent Labs Lab 10/03/16 1111 10/04/16 0850  LATICACIDVEN 2.12* 0.7    Recent Results (from the past 240 hour(s))  Urine culture     Status: None (Preliminary result)   Collection Time: 10/03/16 10:48 AM  Result Value Ref Range Status   Specimen Description URINE, CLEAN CATCH  Final   Special Requests NONE  Final   Culture   Final    CULTURE REINCUBATED FOR BETTER GROWTH Performed at [facility]    Report Status PENDING  Incomplete  Blood Culture (routine x 2)     Status: None (Preliminary result)   Collection Time: 10/03/16 11:01 AM  Result Value Ref Range Status   Specimen Description BLOOD BLOOD LEFT FOREARM  Final   Special Requests IN PEDIATRIC BOTTLE 3 CC  Final   Culture   Final    NO GROWTH 2 DAYS Performed at [facility]    Report Status PENDING  Incomplete  Blood Culture (routine x 2)     Status: None (Preliminary result)   Collection Time: 10/03/16 11:01 AM  Result Value Ref Range Status   Specimen Description BLOOD RIGHT ARM  Final   Special Requests BOTTLES DRAWN AEROBIC AND ANAEROBIC 5CC  Final   Culture   Final    NO GROWTH 2 DAYS Performed at [facility]    Report Status PENDING  Incomplete  MRSA PCR Screening     Status: None   Collection Time: 10/03/16  2:12 PM  Result Value Ref Range Status   MRSA by PCR NEGATIVE NEGATIVE Final    Comment:        The GeneXpert MRSA Assay (FDA approved for NASAL specimens only), is one component of a comprehensive MRSA colonization surveillance program. It is not intended to diagnose MRSA infection nor to guide or monitor treatment for MRSA infections.   Urine culture     Status: None (Preliminary  result)   Collection Time: 10/03/16  3:06 PM  Result Value Ref Range Status   Specimen Description URINE, RANDOM  Final   Special Requests NONE  Final   Culture   Final    CULTURE REINCUBATED FOR BETTER GROWTH Performed at [facility]    Report Status PENDING  Incomplete     Radiology Studies: No results found.  Scheduled Meds: . cefTRIAXone (ROCEPHIN)  IV  1 g Intravenous Q24H  . donepezil  10 mg Oral Daily  . heparin  5,000 Units Subcutaneous Q8H  . memantine  10 mg Oral QHS  . memantine  5 mg Oral Daily  . sodium chloride flush  3 mL Intravenous Q12H   Continuous Infusions: . sodium chloride 100 mL/hr at 10/05/16 0954     LOS: 2 days   CHIU, Scheryl MartenSTEPHEN K, MD [facility] Pager [phone]  If 7PM-7AM, please contact night-coverage [email or web address] Password [email or web address] 10/05/2016, 6:41 PM

## 2016-10-06 DIAGNOSIS — R6521 Severe sepsis with septic shock: Secondary | ICD-10-CM

## 2016-10-06 DIAGNOSIS — A419 Sepsis, unspecified organism: Principal | ICD-10-CM

## 2016-10-06 LAB — BASIC METABOLIC PANEL
Anion gap: 5 (ref 5–15)
BUN: 13 mg/dL (ref 6–20)
CHLORIDE: 115 mmol/L — AB (ref 101–111)
CO2: 21 mmol/L — ABNORMAL LOW (ref 22–32)
CREATININE: 0.71 mg/dL (ref 0.44–1.00)
Calcium: 8.3 mg/dL — ABNORMAL LOW (ref 8.9–10.3)
Glucose, Bld: 96 mg/dL (ref 65–99)
POTASSIUM: 3.9 mmol/L (ref 3.5–5.1)
SODIUM: 141 mmol/L (ref 135–145)

## 2016-10-06 LAB — URINE CULTURE

## 2016-10-06 MED ORDER — CEFUROXIME AXETIL 250 MG PO TABS: 250.0000 mg | ORAL_TABLET | Freq: Two times a day (BID) | ORAL | 0 refills | Status: DC

## 2016-10-06 NOTE — Evaluation (Signed)
Physical Therapy Evaluation Patient Details Name: Lori [name] MRN: [id number] DOB: 02/04/1920 Today's Date: 10/06/2016   History of Present Illness  82 year old female with dementia, hearing loss, osteoporosis, hypertension, back pain and [facility] after she was found on the floor in her room, diagnosed with sepsis and acute kidney injury.  Clinical Impression  Pt admitted with above diagnosis. Pt currently with functional limitations due to the deficits listed below (see PT Problem List).  Pt will benefit from skilled PT to increase their independence and safety with mobility to allow discharge to the venue listed below.   Pt able to ambulate with min/guard assist for safety as she hasn't ambulated in a few days.  Pt typically ambulatory to dining hall at ALF.  Pt's family plans to discuss possible increase in care at ALF. Recommend HHPT if d/c back to ALF.     Follow Up Recommendations Home health PT;Supervision for mobility/OOB    Equipment Recommendations  Other (comment)    Recommendations for Other Services       Precautions / Restrictions        Mobility  Bed Mobility Overal bed mobility: Needs Assistance Bed Mobility: Supine to Sit     Supine to sit: HOB elevated;Supervision     General bed mobility comments: verbal cues for technique, HOB elevated  Transfers Overall transfer level: Needs assistance Equipment used: Rolling walker (2 wheeled) Transfers: Sit to/from Stand Sit to Stand: Min guard         General transfer comment: increased time and effort however no physical assist required, verbal cues for hand placement  Ambulation/Gait Ambulation/Gait assistance: Min guard Ambulation Distance (Feet): 120 Feet Assistive device: Rolling walker (2 wheeled) Gait Pattern/deviations: Step-through pattern;Drifts right/left;Narrow base of support;Shuffle     General Gait Details: verbal cues for RW  positioning, distance to tolerance  Stairs            Wheelchair Mobility    Modified Rankin (Stroke Patients Only)       Balance Overall balance assessment: History of Falls (daughter reports hx of falls from dizziness)                                           Pertinent Vitals/Pain Pain Assessment: No/denies pain    Home Living Family/patient expects to be discharged to:: Assisted living               Home Equipment: Walker - 2 wheels      Prior Function Level of Independence: Independent with assistive device(s)         Comments: ambulates to dining hall for meals, daughter reports pt prefers sponge bathing - fearful of falling in shower     Hand Dominance        Extremity/Trunk Assessment               Lower Extremity Assessment: Generalized weakness      Cervical / Trunk Assessment: Kyphotic  Communication   Communication: HOH  Cognition Arousal/Alertness: Awake/alert Behavior During Therapy: WFL for tasks assessed/performed Overall Cognitive Status: History of cognitive impairments - at baseline                      General Comments      Exercises     Assessment/Plan    PT Assessment Patient needs continued PT  services  PT Problem List Decreased strength;Decreased activity tolerance;Decreased balance;Decreased knowledge of use of DME;Decreased cognition;Decreased mobility          PT Treatment Interventions DME instruction;Gait training;Functional mobility training;Therapeutic exercise;Balance training;Therapeutic activities;Patient/family education    PT Goals (Current goals can be found in the Care Plan section)  Acute Rehab PT Goals PT Goal Formulation: With patient/family Time For Goal Achievement: 10/13/16 Potential to Achieve Goals: Good    Frequency Min 3X/week   Barriers to discharge        Co-evaluation               End of Session Equipment Utilized During Treatment:  Gait belt Activity Tolerance: Patient tolerated treatment well Patient left: in bed;with call bell/phone within reach;with family/visitor present;with bed alarm set Nurse Communication: Mobility status         Time: [id number] PT Time Calculation (min) (ACUTE ONLY): 20 min   Charges:   PT Evaluation $PT Eval Low Complexity: 1 Procedure     PT G Codes:        Lori [name],Lori [name] 10/06/2016, 12:38 PM Zenovia JarredKati Torry Istre, PT, DPT 10/06/2016 Pager: [phone]

## 2016-10-06 NOTE — Clinical Social Work Note (Addendum)
MSW has contacted [facility] in regards to patient's return. Per facility RN patient cannot be readmitted into facility today because RN is not in the building. Patient can return tomorrow before 11AM.   MSW has faxed discharge summary, FL-2 and PT evaluation. [facility] [place] RNCM notified.   MSW will continue to follow pt and pt's family for continued support and to facilitate discharge needs on Sat, 11/25.   Bedside RN, MD, family and RNCM notified.   Derenda FennelBashira Skye [name], MSW [phone] 10/06/2016 1:26 PM

## 2016-10-06 NOTE — Progress Notes (Signed)
Spoke with pt's daughters [phone]) and Kathleen(HCPOA [phone]), both are OK with [facility] coming in next Thurs or Fri.

## 2016-10-06 NOTE — Discharge Summary (Signed)
Physician Discharge Summary  Lori [name] AVW:[id number] DOB: [birth year] DOA: 10/03/2016  PCP: Evette Georges, MD  Admit date: 10/03/2016 Discharge date: 10/06/2016  Admitted From: ALF Disposition:  ALF  Recommendations for Outpatient Follow-up:  1. Follow up with PCP in 1-2 weeks 2. Please follow up on the following pending results:  Home Health:PT/RN   Discharge Condition:Improved CODE STATUS:DNR Diet recommendation: Heart Healthy   Brief/Interim Summary: 82 year old female with dementia, hearing loss, osteoporosis, hypertension, back pain and was sent from [facility] assisted living facility after she was found on the floor in her room. History was obtained from the patient's daughter on the phone and the son at the bedside. Patient's daughter reported that she was called when the patient was found on the floor. They do not know how long she was on the floor. Patient could not recall any events. She may have fallen sometime in the middle of the night. Patient's daughter reported that she had lotof diarrhea over the last 1 week and was significantly dehydrated, poor appetite, had been complaining of low back pain whenever she sat down. She had low-grade fevers in the last few days. Patient had been complaining of dysuria and increasing frequency of urination. Patient had no chest pain or shortness of breath. No vomiting. Patient herself was not able to provide any history.  Sepsis present on admission (HCC)With hypotension, hypothermia, possible hypovolemia due to recent significant diarrhea, lactic acidosis, septic shock - Likely UTI as etiology of presenting sepsis - Sepsis physiology has resolved - Urine culture reviewed, no significant growth - Clinically, patient appears much improved - Patient continued on rocephin. Patient to complete course with ceftin on discharge  Acute kidney injury [facility]) - Likely secondary to presenting rhabdo with sepsis - Labs  reviewed. Creatinine has normalized  Fall - No fractures noted at time of presentation - Appear stable. - Currently patient resides at an assisted living facility  Elevated troponin - Serial trop stable at around 0.18 overnight - EKG reviewed. Stable - 2d echo performed. Results reviewed. No wall motion abnormality noted  Lactic acidosis, Rhabdomyolysis - Patient is continued on IV fluids - CK has improved to 111 from 306 at time of admission  Dementia - Appears stable at present - Continue Namenda, Aricept   Recent diarrhea - Per family diarrhea has resolved - Remains stable at present  Discharge Diagnoses:  Principal Problem:   Sepsis (HCC) Active Problems:   UTI (urinary tract infection)   Septic shock (HCC)   Acute kidney injury (HCC)   Fall   Elevated troponin   Lactic acidosis   Rhabdomyolysis   Dementia    Discharge Instructions     Medication List    STOP taking these medications   cyanocobalamin 1000 MCG/ML injection Commonly known as:  (VITAMIN B-12)   sulfamethoxazole-trimethoprim 800-160 MG tablet Commonly known as:  BACTRIM DS,SEPTRA DS     TAKE these medications   cefUROXime 250 MG tablet Commonly known as:  CEFTIN Take 1 tablet (250 mg total) by mouth 2 (two) times daily with a meal.   donepezil 10 MG tablet Commonly known as:  ARICEPT Take 1 tablet (10 mg total) by mouth daily.   guaiFENesin 600 MG 12 hr tablet Commonly known as:  MUCINEX Take 600 mg by mouth 2 (two) times daily.   loperamide 2 MG capsule Commonly known as:  IMODIUM Take 1 capsule (2 mg total) by mouth 4 (four) times daily as needed for diarrhea or loose stools. What changed:  when to take this   memantine 5 MG tablet Commonly known as:  NAMENDA Take 5-10 mg by mouth 2 (two) times daily. Takes 5 mg in the morning and 10 mg at bedtime What changed:  Another medication with the same name was removed. Continue taking this medication, and follow the  directions you see here.      Follow-up Information    TODD,JEFFREY ALLEN, MD. Schedule an appointment as soon as possible for a visit in 2 week(s).   Specialty:  Family Medicine Contact information: [address] [place] [address] [phone]        Jerilee FieldESKRIDGE, MATTHEW, MD. Schedule an appointment as soon as possible for a visit.   Specialty:  Urology Contact information: [address] [place] [address] [phone]          No Known Allergies  Procedures/Studies: Dg Chest 2 View  Result Date: 10/03/2016 CLINICAL DATA:  Recent fall EXAM: CHEST  2 VIEW COMPARISON:  11/22/2012 FINDINGS: Cardiac shadow is stable. Calcified granuloma is again noted in the right lower lobe. Bony structures are within normal limits. No pneumothorax is seen. No focal infiltrate or sizable effusion is noted. IMPRESSION: Chronic changes without acute abnormality. Electronically Signed   By: Alcide CleverMark  Lukens M.D.   On: 10/03/2016 11:40   Dg Lumbar Spine Complete  Result Date: 10/03/2016 CLINICAL DATA:  Recent fall with low back pain, initial encounter EXAM: LUMBAR SPINE - COMPLETE 4+ VIEW COMPARISON:  None. FINDINGS: Five lumbar type vertebral bodies are well visualized. Vertebral body height is well maintained. Mild degenerative anterolisthesis of L5 on S1 is seen. No definitive pars defects are noted. Diffuse aortic calcifications are seen. IMPRESSION: Degenerative change with mild anterolisthesis. No acute abnormality is noted. Electronically Signed   By: Alcide CleverMark  Lukens M.D.   On: 10/03/2016 11:42   Ct Head Wo Contrast  Result Date: 10/03/2016 CLINICAL DATA:  Pt with unwitnessed fall. Found on the floor this morning and patient c/o lower back / pelvis pain. Pt states she thinks she fell last night. EMS reports patient had a fall yesterday during the day as well per NH staff. Pt with hx of alzheimers EXAM: CT HEAD WITHOUT CONTRAST CT CERVICAL SPINE WITHOUT CONTRAST TECHNIQUE: Multidetector  CT imaging of the head and cervical spine was performed following the standard protocol without intravenous contrast. Multiplanar CT image reconstructions of the cervical spine were also generated. COMPARISON:  Brain MRI 03/12/2013, head CT 12/31/2012 FINDINGS: CT HEAD FINDINGS Brain: No acute intracranial hemorrhage. No focal mass lesion. No CT evidence of acute infarction. No midline shift or mass effect. No hydrocephalus. Basilar cisterns are patent. There are periventricular and subcortical white matter hypodensities. Generalized cortical atrophy. Vascular: No hyperdense vessel or unexpected calcification. Skull: Normal. Negative for fracture or focal lesion. Sinuses/Orbits: No acute finding. Other: Neck CT 2009.  Chronic CT 2000 CT CERVICAL SPINE FINDINGS Alignment: Normal. Skull base and vertebrae: Normal craniocervical junction. Normal alignment of vertebral bodies. Normal articulation mild degenerate spurring. Soft tissues and spinal canal: No prevertebral fluid or swelling. No visible canal hematoma. Disc levels:  Unremarkable Upper chest: There is biapical partially calcified apical thickening which appears chronic benign Other: Small amount of gas along the LEFT C6 transverse foramen is felt to relate to venipuncture (image 31, series 13). IMPRESSION: 1. No intracranial trauma. 2. Atrophy and microvascular disease advanced from 2009. 3. No cervical spine fracture 4. Biapical calcified pleural parenchymal scarring appears benign. Electronically Signed   By: Genevive BiStewart  Edmunds  M.D.   On: 10/03/2016 12:20   Ct Cervical Spine Wo Contrast  Result Date: 10/03/2016 CLINICAL DATA:  Pt with unwitnessed fall. Found on the floor this morning and patient c/o lower back / pelvis pain. Pt states she thinks she fell last night. EMS reports patient had a fall yesterday during the day as well per NH staff. Pt with hx of alzheimers EXAM: CT HEAD WITHOUT CONTRAST CT CERVICAL SPINE WITHOUT CONTRAST TECHNIQUE:  Multidetector CT imaging of the head and cervical spine was performed following the standard protocol without intravenous contrast. Multiplanar CT image reconstructions of the cervical spine were also generated. COMPARISON:  Brain MRI 03/12/2013, head CT 12/31/2012 FINDINGS: CT HEAD FINDINGS Brain: No acute intracranial hemorrhage. No focal mass lesion. No CT evidence of acute infarction. No midline shift or mass effect. No hydrocephalus. Basilar cisterns are patent. There are periventricular and subcortical white matter hypodensities. Generalized cortical atrophy. Vascular: No hyperdense vessel or unexpected calcification. Skull: Normal. Negative for fracture or focal lesion. Sinuses/Orbits: No acute finding. Other: Neck CT 2009.  Chronic CT 2000 CT CERVICAL SPINE FINDINGS Alignment: Normal. Skull base and vertebrae: Normal craniocervical junction. Normal alignment of vertebral bodies. Normal articulation mild degenerate spurring. Soft tissues and spinal canal: No prevertebral fluid or swelling. No visible canal hematoma. Disc levels:  Unremarkable Upper chest: There is biapical partially calcified apical thickening which appears chronic benign Other: Small amount of gas along the LEFT C6 transverse foramen is felt to relate to venipuncture (image 31, series 13). IMPRESSION: 1. No intracranial trauma. 2. Atrophy and microvascular disease advanced from 2009. 3. No cervical spine fracture 4. Biapical calcified pleural parenchymal scarring appears benign. Electronically Signed   By: Genevive BiStewart  Edmunds M.D.   On: 10/03/2016 12:20   Dg Hip Unilat W Or Wo Pelvis 2-3 Views Right  Result Date: 10/03/2016 CLINICAL DATA:  Recent fall with right hip pain, initial encounter EXAM: DG HIP (WITH OR WITHOUT PELVIS) 2-3V RIGHT COMPARISON:  None. FINDINGS: The pelvic ring is intact. No acute fracture or dislocation is seen. No gross soft tissue abnormality is noted. IMPRESSION: No acute abnormality noted. Electronically Signed    By: Alcide CleverMark  Lukens M.D.   On: 10/03/2016 11:41   Dg Femur Min 2 Views Right  Result Date: 10/03/2016 CLINICAL DATA:  Recent fall with right leg pain, initial encounter EXAM: RIGHT FEMUR 2 VIEWS COMPARISON:  None. FINDINGS: There is no evidence of fracture or other focal bone lesions. Soft tissues are unremarkable. IMPRESSION: No acute abnormality noted. Electronically Signed   By: Alcide CleverMark  Lukens M.D.   On: 10/03/2016 11:42    Subjective: No complaints  Discharge Exam: Vitals:   10/05/16 2036 10/06/16 0648  BP: (!) 119/59 126/69  Pulse: 70 72  Resp: 16 16  Temp: 98.4 F (36.9 C) 98.2 F (36.8 C)   Vitals:   10/05/16 0453 10/05/16 1454 10/05/16 2036 10/06/16 0648  BP: (!) 106/51 (!) 155/66 (!) 119/59 126/69  Pulse: 100 79 70 72  Resp: 18 16 16 16   Temp: 97.5 F (36.4 C) 97.4 F (36.3 C) 98.4 F (36.9 C) 98.2 F (36.8 C)  TempSrc: Oral Oral Oral Oral  SpO2: 100% 93% 92% 94%  Weight:      Height:        General: Pt is alert, awake, not in acute distress Cardiovascular: RRR, S1/S2 +, no rubs, no gallops Respiratory: CTA bilaterally, no wheezing, no rhonchi Abdominal: Soft, NT, ND, bowel sounds + Extremities: no edema, no cyanosis  The results of significant diagnostics from this hospitalization (including imaging, microbiology, ancillary and laboratory) are listed below for reference.     Microbiology: Recent Results (from the past 240 hour(s))  Urine culture     Status: None (Preliminary result)   Collection Time: 10/03/16 10:48 AM  Result Value Ref Range Status   Specimen Description URINE, CLEAN CATCH  Final   Special Requests NONE  Final   Culture   Final    CULTURE REINCUBATED FOR BETTER GROWTH Performed at [facility]    Report Status PENDING  Incomplete  Blood Culture (routine x 2)     Status: None (Preliminary result)   Collection Time: 10/03/16 11:01 AM  Result Value Ref Range Status   Specimen Description BLOOD BLOOD LEFT FOREARM  Final    Special Requests IN PEDIATRIC BOTTLE 3 CC  Final   Culture   Final    NO GROWTH 2 DAYS Performed at [facility]    Report Status PENDING  Incomplete  Blood Culture (routine x 2)     Status: None (Preliminary result)   Collection Time: 10/03/16 11:01 AM  Result Value Ref Range Status   Specimen Description BLOOD RIGHT ARM  Final   Special Requests BOTTLES DRAWN AEROBIC AND ANAEROBIC 5CC  Final   Culture   Final    NO GROWTH 2 DAYS Performed at [facility]    Report Status PENDING  Incomplete  MRSA PCR Screening     Status: None   Collection Time: 10/03/16  2:12 PM  Result Value Ref Range Status   MRSA by PCR NEGATIVE NEGATIVE Final    Comment:        The GeneXpert MRSA Assay (FDA approved for NASAL specimens only), is one component of a comprehensive MRSA colonization surveillance program. It is not intended to diagnose MRSA infection nor to guide or monitor treatment for MRSA infections.   Urine culture     Status: Abnormal   Collection Time: 10/03/16  3:06 PM  Result Value Ref Range Status   Specimen Description URINE, RANDOM  Final   Special Requests NONE  Final   Culture MULTIPLE SPECIES PRESENT, SUGGEST RECOLLECTION (A)  Final   Report Status 10/06/2016 FINAL  Final     Labs: BNP (last 3 results) No results for input(s): BNP in the last 8760 hours. Basic Metabolic Panel:  Recent Labs Lab 10/03/16 1101 10/04/16 0308 10/05/16 0359 10/06/16 0334  NA 140 140 143 141  K 3.9 3.3* 3.3* 3.9  CL 107 117* 118* 115*  CO2 21* 18* 20* 21*  GLUCOSE 131* 92 91 96  BUN 62* 40* 25* 13  CREATININE 1.65* 0.95 0.71 0.71  CALCIUM 10.1 8.1* 8.1* 8.3*   Liver Function Tests:  Recent Labs Lab 10/03/16 1101  AST 20  ALT 14  ALKPHOS 61  BILITOT 1.0  PROT 7.3  ALBUMIN 4.2   No results for input(s): LIPASE, AMYLASE in the last 168 hours. No results for input(s): AMMONIA in the last 168 hours. CBC:  Recent Labs Lab 10/03/16 1101 10/04/16 0308  10/05/16 0359  WBC 9.0 5.7 5.3  NEUTROABS 7.2  --   --   HGB 14.4 10.3* 10.1*  HCT 41.3 30.1* 30.0*  MCV 90.6 93.2 90.1  PLT 276 215 203   Cardiac Enzymes:  Recent Labs Lab 10/03/16 1101 10/03/16 1546 10/03/16 2115 10/04/16 0308 10/04/16 0850 10/05/16 0359  CKTOTAL 306*  --   --   --  209 111  TROPONINI  --  0.18* 0.19* 0.18*  --   --    BNP: Invalid input(s): POCBNP CBG: No results for input(s): GLUCAP in the last 168 hours. D-Dimer No results for input(s): DDIMER in the last 72 hours. Hgb A1c No results for input(s): HGBA1C in the last 72 hours. Lipid Profile No results for input(s): CHOL, HDL, LDLCALC, TRIG, CHOLHDL, LDLDIRECT in the last 72 hours. Thyroid function studies No results for input(s): TSH, T4TOTAL, T3FREE, THYROIDAB in the last 72 hours.  Invalid input(s): FREET3 Anemia work up No results for input(s): VITAMINB12, FOLATE, FERRITIN, TIBC, IRON, RETICCTPCT in the last 72 hours. Urinalysis    Component Value Date/Time   COLORURINE YELLOW 10/03/2016 1048   APPEARANCEUR TURBID (A) 10/03/2016 1048   LABSPEC 1.016 10/03/2016 1048   PHURINE 7.5 10/03/2016 1048   GLUCOSEU NEGATIVE 10/03/2016 1048   HGBUR NEGATIVE 10/03/2016 1048   HGBUR moderate 02/21/2010 1051   BILIRUBINUR NEGATIVE 10/03/2016 1048   BILIRUBINUR neg 07/07/2016 1104   KETONESUR NEGATIVE 10/03/2016 1048   PROTEINUR 100 (A) 10/03/2016 1048   UROBILINOGEN 0.2 07/07/2016 1104   UROBILINOGEN 0.2 06/28/2015 1401   NITRITE NEGATIVE 10/03/2016 1048   LEUKOCYTESUR NEGATIVE 10/03/2016 1048   Sepsis Labs Invalid input(s): PROCALCITONIN,  WBC,  LACTICIDVEN Microbiology Recent Results (from the past 240 hour(s))  Urine culture     Status: None (Preliminary result)   Collection Time: 10/03/16 10:48 AM  Result Value Ref Range Status   Specimen Description URINE, CLEAN CATCH  Final   Special Requests NONE  Final   Culture   Final    CULTURE REINCUBATED FOR BETTER GROWTH Performed at [facility]    Report Status PENDING  Incomplete  Blood Culture (routine x 2)     Status: None (Preliminary result)   Collection Time: 10/03/16 11:01 AM  Result Value Ref Range Status   Specimen Description BLOOD BLOOD LEFT FOREARM  Final   Special Requests IN PEDIATRIC BOTTLE 3 CC  Final   Culture   Final    NO GROWTH 2 DAYS Performed at [facility]    Report Status PENDING  Incomplete  Blood Culture (routine x 2)     Status: None (Preliminary result)   Collection Time: 10/03/16 11:01 AM  Result Value Ref Range Status   Specimen Description BLOOD RIGHT ARM  Final   Special Requests BOTTLES DRAWN AEROBIC AND ANAEROBIC 5CC  Final   Culture   Final    NO GROWTH 2 DAYS Performed at [facility]    Report Status PENDING  Incomplete  MRSA PCR Screening     Status: None   Collection Time: 10/03/16  2:12 PM  Result Value Ref Range Status   MRSA by PCR NEGATIVE NEGATIVE Final    Comment:        The GeneXpert MRSA Assay (FDA approved for NASAL specimens only), is one component of a comprehensive MRSA colonization surveillance program. It is not intended to diagnose MRSA infection nor to guide or monitor treatment for MRSA infections.   Urine culture     Status: Abnormal   Collection Time: 10/03/16  3:06 PM  Result Value Ref Range Status   Specimen Description URINE, RANDOM  Final   Special Requests NONE  Final   Culture MULTIPLE SPECIES PRESENT, SUGGEST RECOLLECTION (A)  Final   Report Status 10/06/2016 FINAL  Final     SIGNED:   Jerald Kief, MD  [facility] 10/06/2016, 12:35 PM  If 7PM-7AM,  please contact night-coverage [email or web address] Password [email or web address]

## 2016-10-06 NOTE — [place] FL2 (Addendum)
[place] MEDICAID FL2 LEVEL OF CARE SCREENING TOOL     IDENTIFICATION  Patient Name: Lori [name] Birthdate: 12/08/1919 Sex: female Admission Date (Current Location): 10/03/2016  [facility] [place]   Facility and Address[facility]  [facility],  [address], [place] [address]      Provider Number: [id number]  Attending Physician Name and Address:  Jerald KiefStephen K Chiu, MD  Relative Name and Phone Number:       Current Level of Care: Hospital  Recommended Level of Care: Assisted Living Facility  Prior Approval Number:    Date Approved/Denied:   PASRR Number:   [id number]   Discharge Plan: ALF    Current Diagnoses: Patient Active Problem List   Diagnosis Date Noted  . Septic shock (HCC) 10/03/2016  . Sepsis (HCC) 10/03/2016  . Acute kidney injury (HCC) 10/03/2016  . Fall 10/03/2016  . Elevated troponin 10/03/2016  . Lactic acidosis 10/03/2016  . Rhabdomyolysis 10/03/2016  . Dementia 10/03/2016  . Acute cystitis 01/14/2015  . Tremor, essential 06/30/2014  . Inflamed skin tag 09/01/2013  . Memory change   . Hallucinations 01/03/2013  . UTI (urinary tract infection)   . Rhinitis, nonallergic, chronic 11/22/2012  . Pernicious anemia 11/18/2012  . Neuropathy (HCC) 11/18/2012  . Corn of toe 11/18/2012  . Hypertension 06/13/2012  . Chronic obstructive airway disease with asthma (HCC) 10/26/2010  . CERUMEN IMPACTION, BILATERAL 10/05/2008  . BACK PAIN, ACUTE 01/10/2008  . MIGRAINE HEADACHE 07/23/2007  . LOSS, HEARING NEC 07/23/2007  . STENOSIS, MITRAL 07/23/2007  . OSTEOPOROSIS 07/23/2007  . EDEMA 07/23/2007    Orientation RESPIRATION BLADDER Height & Weight     Self, Place  Normal Continent Weight: 102 lb 8.2 oz (46.5 kg) Height:  5' 2.5" (158.8 cm)  BEHAVIORAL SYMPTOMS/MOOD NEUROLOGICAL BOWEL NUTRITION STATUS   (none )  (none ) Continent Diet (no salt added)  AMBULATORY STATUS COMMUNICATION OF NEEDS Skin   Supervision  Verbally Normal                       Personal Care Assistance Level of Assistance  Bathing, Feeding, Dressing Bathing Assistance: Limited assistance Feeding assistance: Limited assistance Dressing Assistance: Limited assistance     Functional Limitations Info  Speech, Hearing, Sight Sight Info: Adequate Hearing Info: Adequate Speech Info: Adequate    SPECIAL CARE FACTORS FREQUENCY  PT (By licensed PT)     OT (By licensed OT)     PT Frequency: 3    OT Frequency: 3          Contractures      Additional Factors Info  Code Status, Allergies Code Status Info: DNR code  Allergies Info: N/A           Current Medications (10/06/2016):  This is the current hospital active medication list Current Facility-Administered Medications  Medication Dose Route Frequency Provider Last Rate Last Dose  . 0.9 %  sodium chloride infusion   Intravenous Continuous Ripudeep Jenna LuoK Rai, MD 100 mL/hr at 10/06/16 0555    . acetaminophen (TYLENOL) tablet 650 mg  650 mg Oral Q6H PRN Ripudeep Jenna LuoK Rai, MD   650 mg at 10/04/16 2334   Or  . acetaminophen (TYLENOL) suppository 650 mg  650 mg Rectal Q6H PRN Ripudeep K Rai, MD      . cefTRIAXone (ROCEPHIN) 1 g in dextrose 5 % 50 mL IVPB  1 g Intravenous Q24H Ripudeep K Rai, MD   1 g at 10/05/16  1234  . donepezil (ARICEPT) tablet 10 mg  10 mg Oral Daily Ripudeep K Rai, MD   10 mg at 10/06/16 0959  . heparin injection 5,000 Units  5,000 Units Subcutaneous Q8H Ripudeep Jenna LuoK Rai, MD   5,000 Units at 10/06/16 0555  . loperamide (IMODIUM) capsule 2 mg  2 mg Oral QID PRN Ripudeep Jenna LuoK Rai, MD      . memantine [facility](NAMENDA) tablet 10 mg  10 mg Oral QHS Ripudeep Jenna LuoK Rai, MD   10 mg at 10/05/16 2125  . memantine (NAMENDA) tablet 5 mg  5 mg Oral Daily Ripudeep Jenna LuoK Rai, MD   5 mg at 10/06/16 0959  . ondansetron (ZOFRAN) tablet 4 mg  4 mg Oral Q6H PRN Ripudeep Jenna LuoK Rai, MD       Or  . ondansetron (ZOFRAN) injection 4 mg  4 mg Intravenous Q6H PRN Ripudeep K Rai, MD      .  sodium chloride flush (NS) 0.9 % injection 3 mL  3 mL Intravenous Q12H Ripudeep K Rai, MD   3 mL at 10/04/16 2200     Discharge Medications: Please see discharge summary for a list of discharge medications.  Relevant Imaging Results:  Relevant Lab Results:   Additional Information SSN [id number]   Derenda FennelBashira Cormick Moss, MSW [phone] 10/06/2016 12:35 PM

## 2016-10-07 DIAGNOSIS — I4891 Unspecified atrial fibrillation: Secondary | ICD-10-CM

## 2016-10-07 NOTE — Progress Notes (Signed)
PROGRESS NOTE    Lori [name]  RUE:[id number] DOB: 04-Mar-1920 DOA: 10/03/2016 PCP: Evette Georges, MD    Brief Narrative:  82 year old female with dementia, hearing loss, osteoporosis, hypertension, back pain and was sent from [facility] assisted living facility after she was found on the floor in her room. History was obtained from the patient's daughter on the phone and the son at the bedside. Patient's daughter reported that she was called when the patient was found on the floor. They do not know how long she was on the floor. Patient could not recall any events. She may have fallen sometime in the middle of the night. Patient's daughter reported that she had lot of diarrhea over the last 1 week and was significantly dehydrated, poor appetite, had been complaining of low back pain whenever she sat down. She had low-grade fevers in the last few days. Patient had been complaining of dysuria and increasing frequency of urination. Patient had no chest pain or shortness of breath. No vomiting. Patient herself was not able to provide any history.  Assessment & Plan:   Principal Problem:   Sepsis (HCC) Active Problems:   UTI (urinary tract infection)   Septic shock (HCC)   Acute kidney injury (HCC)   Fall   Elevated troponin   Lactic acidosis   Rhabdomyolysis   Dementia  Sepsis present on admission (HCC)With hypotension, hypothermia, possible hypovolemia due to recent significant diarrhea, lactic acidosis, septic shock - Likely UTI as etiology of presenting sepsis - Sepsis physiology has resolved - Urine culture reviewed, no significant growth - Clinically, patient appears much improved - Patient continued on rocephin. Patient to complete course with ceftin on discharge  Acute kidney injury [facility]) - Likely secondary to presenting rhabdo with sepsis - Labs reviewed. Creatinine has normalized  Fall - No fractures noted at time of presentation - Appear  stable. - Currently patient resides at an assisted living facility  Elevated troponin - Serial trop stable at around 0.18 overnight - EKG reviewed. Stable - 2d echo performed. Results reviewed. No wall motion abnormality noted  Lactic acidosis, Rhabdomyolysis - Patient is continued on IV fluids - CK has improved to 111 from 306 at time of admission  Dementia - Appears stable at present - Continue Namenda, Aricept   Recent diarrhea - Per familydiarrheahas resolved - Remains stable at present  DVT prophylaxis: Heparin subq Code Status: DNR Family Communication: Pt in room, family not at bedside Disposition Plan: Return to ALF today  Consultants:     Procedures:     Antimicrobials: Anti-infectives    Start     Dose/Rate Route Frequency Ordered Stop   10/06/16 0000  cefUROXime (CEFTIN) 250 MG tablet     250 mg Oral 2 times daily with meals 10/06/16 1215     10/04/16 1200  cefTRIAXone (ROCEPHIN) 1 g in dextrose 5 % 50 mL IVPB     1 g 100 mL/hr over 30 Minutes Intravenous Every 24 hours 10/03/16 1357     10/03/16 1200  cefTRIAXone (ROCEPHIN) 1 g in dextrose 5 % 50 mL IVPB     1 g 100 mL/hr over 30 Minutes Intravenous  Once 10/03/16 1155 10/03/16 1244      Subjective: No complaints  Objective: Vitals:   10/06/16 0648 10/06/16 1454 10/06/16 2330 10/07/16 0610  BP: 126/69 (!) 120/55 123/64 129/86  Pulse: 72 68 76 72  Resp: 16 16 18 16   Temp: 98.2 F (36.8 C) 98 F (36.7 C) 99.1 F (  37.3 C) 99.2 F (37.3 C)  TempSrc: Oral Oral Oral Oral  SpO2: 94% 93% 91% 92%  Weight:      Height:        Intake/Output Summary (Last 24 hours) at 10/07/16 0910 Last data filed at 10/07/16 0900  Gross per 24 hour  Intake             3050 ml  Output             4100 ml  Net            -1050 ml   Filed Weights   10/03/16 1401 10/04/16 2200  Weight: 42.4 kg (93 lb 7.6 oz) 46.5 kg (102 lb 8.2 oz)    Examination:  General exam: Lying in bed, no acute  distress Respiratory system: Normal respiratory effort, no audible wheezing Cardiovascular system: Regular rate, S1-S2 Gastrointestinal system: Positive bowel sounds, nondistended Central nervous system: CN II through XII grossly intact, sensation intact Extremities: No clubbing, no cyanosis Skin: Normal skin turgor, no rashes Psychiatry: Mood appears normal, no auditory hallucinations  Data Reviewed: I have personally reviewed following labs and imaging studies  CBC:  Recent Labs Lab 10/03/16 1101 10/04/16 0308 10/05/16 0359  WBC 9.0 5.7 5.3  NEUTROABS 7.2  --   --   HGB 14.4 10.3* 10.1*  HCT 41.3 30.1* 30.0*  MCV 90.6 93.2 90.1  PLT 276 215 203   Basic Metabolic Panel:  Recent Labs Lab 10/03/16 1101 10/04/16 0308 10/05/16 0359 10/06/16 0334  NA 140 140 143 141  K 3.9 3.3* 3.3* 3.9  CL 107 117* 118* 115*  CO2 21* 18* 20* 21*  GLUCOSE 131* 92 91 96  BUN 62* 40* 25* 13  CREATININE 1.65* 0.95 0.71 0.71  CALCIUM 10.1 8.1* 8.1* 8.3*   GFR: Estimated Creatinine Clearance: 31.6 mL/min (by C-G formula based on SCr of 0.71 mg/dL). Liver Function Tests:  Recent Labs Lab 10/03/16 1101  AST 20  ALT 14  ALKPHOS 61  BILITOT 1.0  PROT 7.3  ALBUMIN 4.2   No results for input(s): LIPASE, AMYLASE in the last 168 hours. No results for input(s): AMMONIA in the last 168 hours. Coagulation Profile: No results for input(s): INR, PROTIME in the last 168 hours. Cardiac Enzymes:  Recent Labs Lab 10/03/16 1101 10/03/16 1546 10/03/16 2115 10/04/16 0308 10/04/16 0850 10/05/16 0359  CKTOTAL 306*  --   --   --  209 111  TROPONINI  --  0.18* 0.19* 0.18*  --   --    BNP (last 3 results) No results for input(s): PROBNP in the last 8760 hours. HbA1C: No results for input(s): HGBA1C in the last 72 hours. CBG: No results for input(s): GLUCAP in the last 168 hours. Lipid Profile: No results for input(s): CHOL, HDL, LDLCALC, TRIG, CHOLHDL, LDLDIRECT in the last 72  hours. Thyroid Function Tests: No results for input(s): TSH, T4TOTAL, FREET4, T3FREE, THYROIDAB in the last 72 hours. Anemia Panel: No results for input(s): VITAMINB12, FOLATE, FERRITIN, TIBC, IRON, RETICCTPCT in the last 72 hours. Sepsis Labs:  Recent Labs Lab 10/03/16 1111 10/04/16 0850  LATICACIDVEN 2.12* 0.7    Recent Results (from the past 240 hour(s))  Urine culture     Status: None (Preliminary result)   Collection Time: 10/03/16 10:48 AM  Result Value Ref Range Status   Specimen Description URINE, CLEAN CATCH  Final   Special Requests NONE  Final   Culture   Final    CULTURE REINCUBATED FOR  BETTER GROWTH Performed at [facility]    Report Status PENDING  Incomplete  Blood Culture (routine x 2)     Status: None (Preliminary result)   Collection Time: 10/03/16 11:01 AM  Result Value Ref Range Status   Specimen Description BLOOD BLOOD LEFT FOREARM  Final   Special Requests IN PEDIATRIC BOTTLE 3 CC  Final   Culture   Final    NO GROWTH 3 DAYS Performed at [facility]    Report Status PENDING  Incomplete  Blood Culture (routine x 2)     Status: None (Preliminary result)   Collection Time: 10/03/16 11:01 AM  Result Value Ref Range Status   Specimen Description BLOOD RIGHT ARM  Final   Special Requests BOTTLES DRAWN AEROBIC AND ANAEROBIC 5CC  Final   Culture   Final    NO GROWTH 3 DAYS Performed at [facility]    Report Status PENDING  Incomplete  MRSA PCR Screening     Status: None   Collection Time: 10/03/16  2:12 PM  Result Value Ref Range Status   MRSA by PCR NEGATIVE NEGATIVE Final    Comment:        The GeneXpert MRSA Assay (FDA approved for NASAL specimens only), is one component of a comprehensive MRSA colonization surveillance program. It is not intended to diagnose MRSA infection nor to guide or monitor treatment for MRSA infections.   Urine culture     Status: Abnormal   Collection Time: 10/03/16  3:06 PM  Result Value  Ref Range Status   Specimen Description URINE, RANDOM  Final   Special Requests NONE  Final   Culture MULTIPLE SPECIES PRESENT, SUGGEST RECOLLECTION (A)  Final   Report Status 10/06/2016 FINAL  Final     Radiology Studies: No results found.  Scheduled Meds: . cefTRIAXone (ROCEPHIN)  IV  1 g Intravenous Q24H  . donepezil  10 mg Oral Daily  . heparin  5,000 Units Subcutaneous Q8H  . memantine  10 mg Oral QHS  . memantine  5 mg Oral Daily  . sodium chloride flush  3 mL Intravenous Q12H   Continuous Infusions: . sodium chloride 100 mL/hr at 10/07/16 0233     LOS: 4 days   Hermann Dottavio, Scheryl MartenSTEPHEN K, MD [facility] Pager [phone]  If 7PM-7AM, please contact night-coverage [email or web address] Password [email or web address] 10/07/2016, 9:10 AM

## 2016-10-07 NOTE — Clinical Social Work Note (Signed)
Medical Social Worker facilitated patient discharge including contacting patient family and facility to confirm patient discharge plans.  Clinical information faxed to facility and family agreeable with plan.  MSW arranged [facility].  RN to call report prior to discharge.  Medical Social Worker will sign off for now as social work intervention is no longer needed. Please consult us again if new need arises.  Derenda FennelBashira Carlean Crowl, MSW [phone] 10/07/2016 9:35 AM

## 2016-10-07 NOTE — Care Management Note (Signed)
Case Management Note  Patient Details  Name: Lori [name] MRN: [id number] Date of Birth: 02/16/1920  Subjective/Objective:  Sepsis, UTI, Septic Shock, AKI                   Action/Plan: Discharge Planning: AVS reviewed: Chart reviewed. Pt has orders for HHPT/RN. HH arranged with [facility] and soc will begin on Thurs. Family aware.   PCP TODD, JEFFREY A MD  Expected Discharge Date:  10/07/2016               Expected Discharge Plan:  Assisted Living / Rest Home  In-House Referral:  Clinical Social Work  Discharge planning Services  CM Consult  Post Acute Care Choice:  Home Health Choice offered to:  Adult Children  DME Arranged:  N/A DME Agency:  NA  HH Arranged:  PT HH Agency[facility]  [facility]  Status of Service:  Completed, signed off  If discussed at [employer] Meetings, dates discussed:    Additional Comments:  Elliot CousinShavis, Claudetta Sallie Ellen, RN 10/07/2016, 10:07 AM

## 2016-10-08 LAB — CULTURE, BLOOD (ROUTINE X 2)
CULTURE: NO GROWTH
Culture: NO GROWTH

## 2016-10-08 LAB — URINE CULTURE: Culture: >=100000 — AB

## 2016-10-09 DIAGNOSIS — M6282 Rhabdomyolysis: Secondary | ICD-10-CM | POA: Diagnosis not present

## 2016-10-09 DIAGNOSIS — N320 Bladder-neck obstruction: Secondary | ICD-10-CM | POA: Diagnosis not present

## 2016-10-09 DIAGNOSIS — G309 Alzheimer's disease, unspecified: Secondary | ICD-10-CM | POA: Diagnosis not present

## 2016-10-09 DIAGNOSIS — N390 Urinary tract infection, site not specified: Secondary | ICD-10-CM | POA: Diagnosis not present

## 2016-10-10 DIAGNOSIS — G309 Alzheimer's disease, unspecified: Secondary | ICD-10-CM | POA: Diagnosis not present

## 2016-10-10 DIAGNOSIS — H9190 Unspecified hearing loss, unspecified ear: Secondary | ICD-10-CM | POA: Diagnosis not present

## 2016-10-10 DIAGNOSIS — G629 Polyneuropathy, unspecified: Secondary | ICD-10-CM | POA: Diagnosis not present

## 2016-10-10 DIAGNOSIS — J449 Chronic obstructive pulmonary disease, unspecified: Secondary | ICD-10-CM | POA: Diagnosis not present

## 2016-10-10 DIAGNOSIS — M545 Low back pain: Secondary | ICD-10-CM | POA: Diagnosis not present

## 2016-10-10 DIAGNOSIS — Z9181 History of falling: Secondary | ICD-10-CM | POA: Diagnosis not present

## 2016-10-10 DIAGNOSIS — M6282 Rhabdomyolysis: Secondary | ICD-10-CM | POA: Diagnosis not present

## 2016-10-10 DIAGNOSIS — N320 Bladder-neck obstruction: Secondary | ICD-10-CM | POA: Diagnosis not present

## 2016-10-10 DIAGNOSIS — I10 Essential (primary) hypertension: Secondary | ICD-10-CM | POA: Diagnosis not present

## 2016-10-10 DIAGNOSIS — Z466 Encounter for fitting and adjustment of urinary device: Secondary | ICD-10-CM | POA: Diagnosis not present

## 2016-10-10 DIAGNOSIS — A419 Sepsis, unspecified organism: Secondary | ICD-10-CM | POA: Diagnosis not present

## 2016-10-10 DIAGNOSIS — N390 Urinary tract infection, site not specified: Secondary | ICD-10-CM | POA: Diagnosis not present

## 2016-10-10 DIAGNOSIS — Z792 Long term (current) use of antibiotics: Secondary | ICD-10-CM | POA: Diagnosis not present

## 2016-10-11 ENCOUNTER — Other Ambulatory Visit: Payer: Self-pay | Admitting: Family Medicine

## 2016-10-11 DIAGNOSIS — R330 Drug induced retention of urine: Secondary | ICD-10-CM | POA: Diagnosis not present

## 2016-10-11 DIAGNOSIS — Z466 Encounter for fitting and adjustment of urinary device: Secondary | ICD-10-CM | POA: Diagnosis not present

## 2016-10-11 DIAGNOSIS — H9190 Unspecified hearing loss, unspecified ear: Secondary | ICD-10-CM | POA: Diagnosis not present

## 2016-10-11 DIAGNOSIS — J449 Chronic obstructive pulmonary disease, unspecified: Secondary | ICD-10-CM | POA: Diagnosis not present

## 2016-10-11 DIAGNOSIS — G309 Alzheimer's disease, unspecified: Secondary | ICD-10-CM | POA: Diagnosis not present

## 2016-10-11 DIAGNOSIS — I10 Essential (primary) hypertension: Secondary | ICD-10-CM | POA: Diagnosis not present

## 2016-10-11 DIAGNOSIS — M545 Low back pain: Secondary | ICD-10-CM | POA: Diagnosis not present

## 2016-10-11 DIAGNOSIS — N390 Urinary tract infection, site not specified: Secondary | ICD-10-CM | POA: Diagnosis not present

## 2016-10-11 DIAGNOSIS — Z792 Long term (current) use of antibiotics: Secondary | ICD-10-CM | POA: Diagnosis not present

## 2016-10-11 DIAGNOSIS — N320 Bladder-neck obstruction: Secondary | ICD-10-CM | POA: Diagnosis not present

## 2016-10-11 DIAGNOSIS — M6282 Rhabdomyolysis: Secondary | ICD-10-CM | POA: Diagnosis not present

## 2016-10-11 DIAGNOSIS — Z9181 History of falling: Secondary | ICD-10-CM | POA: Diagnosis not present

## 2016-10-11 DIAGNOSIS — G629 Polyneuropathy, unspecified: Secondary | ICD-10-CM | POA: Diagnosis not present

## 2016-10-13 DIAGNOSIS — J449 Chronic obstructive pulmonary disease, unspecified: Secondary | ICD-10-CM | POA: Diagnosis not present

## 2016-10-13 DIAGNOSIS — H9190 Unspecified hearing loss, unspecified ear: Secondary | ICD-10-CM | POA: Diagnosis not present

## 2016-10-13 DIAGNOSIS — Z9181 History of falling: Secondary | ICD-10-CM | POA: Diagnosis not present

## 2016-10-13 DIAGNOSIS — M545 Low back pain: Secondary | ICD-10-CM | POA: Diagnosis not present

## 2016-10-13 DIAGNOSIS — N390 Urinary tract infection, site not specified: Secondary | ICD-10-CM | POA: Diagnosis not present

## 2016-10-13 DIAGNOSIS — G629 Polyneuropathy, unspecified: Secondary | ICD-10-CM | POA: Diagnosis not present

## 2016-10-13 DIAGNOSIS — G309 Alzheimer's disease, unspecified: Secondary | ICD-10-CM | POA: Diagnosis not present

## 2016-10-13 DIAGNOSIS — N320 Bladder-neck obstruction: Secondary | ICD-10-CM | POA: Diagnosis not present

## 2016-10-13 DIAGNOSIS — Z792 Long term (current) use of antibiotics: Secondary | ICD-10-CM | POA: Diagnosis not present

## 2016-10-13 DIAGNOSIS — M6282 Rhabdomyolysis: Secondary | ICD-10-CM | POA: Diagnosis not present

## 2016-10-13 DIAGNOSIS — Z466 Encounter for fitting and adjustment of urinary device: Secondary | ICD-10-CM | POA: Diagnosis not present

## 2016-10-13 DIAGNOSIS — I10 Essential (primary) hypertension: Secondary | ICD-10-CM | POA: Diagnosis not present

## 2016-10-16 DIAGNOSIS — Z466 Encounter for fitting and adjustment of urinary device: Secondary | ICD-10-CM | POA: Diagnosis not present

## 2016-10-16 DIAGNOSIS — G309 Alzheimer's disease, unspecified: Secondary | ICD-10-CM | POA: Diagnosis not present

## 2016-10-16 DIAGNOSIS — M545 Low back pain: Secondary | ICD-10-CM | POA: Diagnosis not present

## 2016-10-16 DIAGNOSIS — M6282 Rhabdomyolysis: Secondary | ICD-10-CM | POA: Diagnosis not present

## 2016-10-16 DIAGNOSIS — G629 Polyneuropathy, unspecified: Secondary | ICD-10-CM | POA: Diagnosis not present

## 2016-10-16 DIAGNOSIS — N390 Urinary tract infection, site not specified: Secondary | ICD-10-CM | POA: Diagnosis not present

## 2016-10-16 DIAGNOSIS — H9190 Unspecified hearing loss, unspecified ear: Secondary | ICD-10-CM | POA: Diagnosis not present

## 2016-10-16 DIAGNOSIS — Z792 Long term (current) use of antibiotics: Secondary | ICD-10-CM | POA: Diagnosis not present

## 2016-10-16 DIAGNOSIS — I10 Essential (primary) hypertension: Secondary | ICD-10-CM | POA: Diagnosis not present

## 2016-10-16 DIAGNOSIS — Z9181 History of falling: Secondary | ICD-10-CM | POA: Diagnosis not present

## 2016-10-16 DIAGNOSIS — N320 Bladder-neck obstruction: Secondary | ICD-10-CM | POA: Diagnosis not present

## 2016-10-16 DIAGNOSIS — J449 Chronic obstructive pulmonary disease, unspecified: Secondary | ICD-10-CM | POA: Diagnosis not present

## 2016-10-17 DIAGNOSIS — M545 Low back pain: Secondary | ICD-10-CM | POA: Diagnosis not present

## 2016-10-17 DIAGNOSIS — R509 Fever, unspecified: Secondary | ICD-10-CM | POA: Diagnosis not present

## 2016-10-17 DIAGNOSIS — Z466 Encounter for fitting and adjustment of urinary device: Secondary | ICD-10-CM | POA: Diagnosis not present

## 2016-10-17 DIAGNOSIS — Z792 Long term (current) use of antibiotics: Secondary | ICD-10-CM | POA: Diagnosis not present

## 2016-10-17 DIAGNOSIS — Z993 Dependence on wheelchair: Secondary | ICD-10-CM | POA: Diagnosis not present

## 2016-10-17 DIAGNOSIS — N320 Bladder-neck obstruction: Secondary | ICD-10-CM | POA: Diagnosis not present

## 2016-10-17 DIAGNOSIS — M6282 Rhabdomyolysis: Secondary | ICD-10-CM | POA: Diagnosis not present

## 2016-10-17 DIAGNOSIS — N390 Urinary tract infection, site not specified: Secondary | ICD-10-CM | POA: Diagnosis not present

## 2016-10-17 DIAGNOSIS — G309 Alzheimer's disease, unspecified: Secondary | ICD-10-CM | POA: Diagnosis not present

## 2016-10-17 DIAGNOSIS — Z9181 History of falling: Secondary | ICD-10-CM | POA: Diagnosis not present

## 2016-10-17 DIAGNOSIS — J449 Chronic obstructive pulmonary disease, unspecified: Secondary | ICD-10-CM | POA: Diagnosis not present

## 2016-10-17 DIAGNOSIS — G629 Polyneuropathy, unspecified: Secondary | ICD-10-CM | POA: Diagnosis not present

## 2016-10-17 DIAGNOSIS — R109 Unspecified abdominal pain: Secondary | ICD-10-CM | POA: Diagnosis not present

## 2016-10-17 DIAGNOSIS — I10 Essential (primary) hypertension: Secondary | ICD-10-CM | POA: Diagnosis not present

## 2016-10-17 DIAGNOSIS — H9190 Unspecified hearing loss, unspecified ear: Secondary | ICD-10-CM | POA: Diagnosis not present

## 2016-10-18 DIAGNOSIS — R509 Fever, unspecified: Secondary | ICD-10-CM | POA: Diagnosis not present

## 2016-10-18 DIAGNOSIS — Z9181 History of falling: Secondary | ICD-10-CM | POA: Diagnosis not present

## 2016-10-18 DIAGNOSIS — M545 Low back pain: Secondary | ICD-10-CM | POA: Diagnosis not present

## 2016-10-18 DIAGNOSIS — G309 Alzheimer's disease, unspecified: Secondary | ICD-10-CM | POA: Diagnosis not present

## 2016-10-18 DIAGNOSIS — Z792 Long term (current) use of antibiotics: Secondary | ICD-10-CM | POA: Diagnosis not present

## 2016-10-18 DIAGNOSIS — N390 Urinary tract infection, site not specified: Secondary | ICD-10-CM | POA: Diagnosis not present

## 2016-10-18 DIAGNOSIS — Z466 Encounter for fitting and adjustment of urinary device: Secondary | ICD-10-CM | POA: Diagnosis not present

## 2016-10-18 DIAGNOSIS — I10 Essential (primary) hypertension: Secondary | ICD-10-CM | POA: Diagnosis not present

## 2016-10-18 DIAGNOSIS — N320 Bladder-neck obstruction: Secondary | ICD-10-CM | POA: Diagnosis not present

## 2016-10-18 DIAGNOSIS — J449 Chronic obstructive pulmonary disease, unspecified: Secondary | ICD-10-CM | POA: Diagnosis not present

## 2016-10-18 DIAGNOSIS — H9190 Unspecified hearing loss, unspecified ear: Secondary | ICD-10-CM | POA: Diagnosis not present

## 2016-10-18 DIAGNOSIS — M6282 Rhabdomyolysis: Secondary | ICD-10-CM | POA: Diagnosis not present

## 2016-10-18 DIAGNOSIS — G629 Polyneuropathy, unspecified: Secondary | ICD-10-CM | POA: Diagnosis not present

## 2016-10-20 DIAGNOSIS — N390 Urinary tract infection, site not specified: Secondary | ICD-10-CM | POA: Diagnosis not present

## 2016-10-20 DIAGNOSIS — H9190 Unspecified hearing loss, unspecified ear: Secondary | ICD-10-CM | POA: Diagnosis not present

## 2016-10-20 DIAGNOSIS — M545 Low back pain: Secondary | ICD-10-CM | POA: Diagnosis not present

## 2016-10-20 DIAGNOSIS — N320 Bladder-neck obstruction: Secondary | ICD-10-CM | POA: Diagnosis not present

## 2016-10-20 DIAGNOSIS — M6282 Rhabdomyolysis: Secondary | ICD-10-CM | POA: Diagnosis not present

## 2016-10-20 DIAGNOSIS — G309 Alzheimer's disease, unspecified: Secondary | ICD-10-CM | POA: Diagnosis not present

## 2016-10-20 DIAGNOSIS — Z466 Encounter for fitting and adjustment of urinary device: Secondary | ICD-10-CM | POA: Diagnosis not present

## 2016-10-20 DIAGNOSIS — Z9181 History of falling: Secondary | ICD-10-CM | POA: Diagnosis not present

## 2016-10-20 DIAGNOSIS — G629 Polyneuropathy, unspecified: Secondary | ICD-10-CM | POA: Diagnosis not present

## 2016-10-20 DIAGNOSIS — Z792 Long term (current) use of antibiotics: Secondary | ICD-10-CM | POA: Diagnosis not present

## 2016-10-20 DIAGNOSIS — J449 Chronic obstructive pulmonary disease, unspecified: Secondary | ICD-10-CM | POA: Diagnosis not present

## 2016-10-20 DIAGNOSIS — I10 Essential (primary) hypertension: Secondary | ICD-10-CM | POA: Diagnosis not present

## 2016-10-21 DIAGNOSIS — M6282 Rhabdomyolysis: Secondary | ICD-10-CM | POA: Diagnosis not present

## 2016-10-21 DIAGNOSIS — G309 Alzheimer's disease, unspecified: Secondary | ICD-10-CM | POA: Diagnosis not present

## 2016-10-21 DIAGNOSIS — Z466 Encounter for fitting and adjustment of urinary device: Secondary | ICD-10-CM | POA: Diagnosis not present

## 2016-10-21 DIAGNOSIS — M545 Low back pain: Secondary | ICD-10-CM | POA: Diagnosis not present

## 2016-10-21 DIAGNOSIS — N390 Urinary tract infection, site not specified: Secondary | ICD-10-CM | POA: Diagnosis not present

## 2016-10-21 DIAGNOSIS — G629 Polyneuropathy, unspecified: Secondary | ICD-10-CM | POA: Diagnosis not present

## 2016-10-21 DIAGNOSIS — Z9181 History of falling: Secondary | ICD-10-CM | POA: Diagnosis not present

## 2016-10-21 DIAGNOSIS — I10 Essential (primary) hypertension: Secondary | ICD-10-CM | POA: Diagnosis not present

## 2016-10-21 DIAGNOSIS — N320 Bladder-neck obstruction: Secondary | ICD-10-CM | POA: Diagnosis not present

## 2016-10-21 DIAGNOSIS — J449 Chronic obstructive pulmonary disease, unspecified: Secondary | ICD-10-CM | POA: Diagnosis not present

## 2016-10-21 DIAGNOSIS — H9190 Unspecified hearing loss, unspecified ear: Secondary | ICD-10-CM | POA: Diagnosis not present

## 2016-10-21 DIAGNOSIS — Z792 Long term (current) use of antibiotics: Secondary | ICD-10-CM | POA: Diagnosis not present

## 2016-10-23 DIAGNOSIS — G309 Alzheimer's disease, unspecified: Secondary | ICD-10-CM | POA: Diagnosis not present

## 2016-10-23 DIAGNOSIS — G629 Polyneuropathy, unspecified: Secondary | ICD-10-CM | POA: Diagnosis not present

## 2016-10-23 DIAGNOSIS — M6282 Rhabdomyolysis: Secondary | ICD-10-CM | POA: Diagnosis not present

## 2016-10-23 DIAGNOSIS — M545 Low back pain: Secondary | ICD-10-CM | POA: Diagnosis not present

## 2016-10-23 DIAGNOSIS — I10 Essential (primary) hypertension: Secondary | ICD-10-CM | POA: Diagnosis not present

## 2016-10-23 DIAGNOSIS — N390 Urinary tract infection, site not specified: Secondary | ICD-10-CM | POA: Diagnosis not present

## 2016-10-23 DIAGNOSIS — J449 Chronic obstructive pulmonary disease, unspecified: Secondary | ICD-10-CM | POA: Diagnosis not present

## 2016-10-23 DIAGNOSIS — Z466 Encounter for fitting and adjustment of urinary device: Secondary | ICD-10-CM | POA: Diagnosis not present

## 2016-10-23 DIAGNOSIS — N320 Bladder-neck obstruction: Secondary | ICD-10-CM | POA: Diagnosis not present

## 2016-10-23 DIAGNOSIS — Z9181 History of falling: Secondary | ICD-10-CM | POA: Diagnosis not present

## 2016-10-23 DIAGNOSIS — Z792 Long term (current) use of antibiotics: Secondary | ICD-10-CM | POA: Diagnosis not present

## 2016-10-23 DIAGNOSIS — H9190 Unspecified hearing loss, unspecified ear: Secondary | ICD-10-CM | POA: Diagnosis not present

## 2016-10-24 DIAGNOSIS — Z792 Long term (current) use of antibiotics: Secondary | ICD-10-CM | POA: Diagnosis not present

## 2016-10-24 DIAGNOSIS — I10 Essential (primary) hypertension: Secondary | ICD-10-CM | POA: Diagnosis not present

## 2016-10-24 DIAGNOSIS — M6282 Rhabdomyolysis: Secondary | ICD-10-CM | POA: Diagnosis not present

## 2016-10-24 DIAGNOSIS — Z9181 History of falling: Secondary | ICD-10-CM | POA: Diagnosis not present

## 2016-10-24 DIAGNOSIS — G309 Alzheimer's disease, unspecified: Secondary | ICD-10-CM | POA: Diagnosis not present

## 2016-10-24 DIAGNOSIS — N320 Bladder-neck obstruction: Secondary | ICD-10-CM | POA: Diagnosis not present

## 2016-10-24 DIAGNOSIS — N390 Urinary tract infection, site not specified: Secondary | ICD-10-CM | POA: Diagnosis not present

## 2016-10-24 DIAGNOSIS — M545 Low back pain: Secondary | ICD-10-CM | POA: Diagnosis not present

## 2016-10-24 DIAGNOSIS — J449 Chronic obstructive pulmonary disease, unspecified: Secondary | ICD-10-CM | POA: Diagnosis not present

## 2016-10-24 DIAGNOSIS — G629 Polyneuropathy, unspecified: Secondary | ICD-10-CM | POA: Diagnosis not present

## 2016-10-24 DIAGNOSIS — Z466 Encounter for fitting and adjustment of urinary device: Secondary | ICD-10-CM | POA: Diagnosis not present

## 2016-10-24 DIAGNOSIS — H9190 Unspecified hearing loss, unspecified ear: Secondary | ICD-10-CM | POA: Diagnosis not present

## 2016-10-25 DIAGNOSIS — H9190 Unspecified hearing loss, unspecified ear: Secondary | ICD-10-CM | POA: Diagnosis not present

## 2016-10-25 DIAGNOSIS — Z9181 History of falling: Secondary | ICD-10-CM | POA: Diagnosis not present

## 2016-10-25 DIAGNOSIS — N320 Bladder-neck obstruction: Secondary | ICD-10-CM | POA: Diagnosis not present

## 2016-10-25 DIAGNOSIS — M545 Low back pain: Secondary | ICD-10-CM | POA: Diagnosis not present

## 2016-10-25 DIAGNOSIS — I10 Essential (primary) hypertension: Secondary | ICD-10-CM | POA: Diagnosis not present

## 2016-10-25 DIAGNOSIS — J449 Chronic obstructive pulmonary disease, unspecified: Secondary | ICD-10-CM | POA: Diagnosis not present

## 2016-10-25 DIAGNOSIS — G309 Alzheimer's disease, unspecified: Secondary | ICD-10-CM | POA: Diagnosis not present

## 2016-10-25 DIAGNOSIS — Z792 Long term (current) use of antibiotics: Secondary | ICD-10-CM | POA: Diagnosis not present

## 2016-10-25 DIAGNOSIS — Z466 Encounter for fitting and adjustment of urinary device: Secondary | ICD-10-CM | POA: Diagnosis not present

## 2016-10-25 DIAGNOSIS — M6282 Rhabdomyolysis: Secondary | ICD-10-CM | POA: Diagnosis not present

## 2016-10-25 DIAGNOSIS — G629 Polyneuropathy, unspecified: Secondary | ICD-10-CM | POA: Diagnosis not present

## 2016-10-25 DIAGNOSIS — N390 Urinary tract infection, site not specified: Secondary | ICD-10-CM | POA: Diagnosis not present

## 2016-10-27 DIAGNOSIS — G309 Alzheimer's disease, unspecified: Secondary | ICD-10-CM | POA: Diagnosis not present

## 2016-10-27 DIAGNOSIS — H9190 Unspecified hearing loss, unspecified ear: Secondary | ICD-10-CM | POA: Diagnosis not present

## 2016-10-27 DIAGNOSIS — Z9181 History of falling: Secondary | ICD-10-CM | POA: Diagnosis not present

## 2016-10-27 DIAGNOSIS — N320 Bladder-neck obstruction: Secondary | ICD-10-CM | POA: Diagnosis not present

## 2016-10-27 DIAGNOSIS — M6282 Rhabdomyolysis: Secondary | ICD-10-CM | POA: Diagnosis not present

## 2016-10-27 DIAGNOSIS — M545 Low back pain: Secondary | ICD-10-CM | POA: Diagnosis not present

## 2016-10-27 DIAGNOSIS — Z792 Long term (current) use of antibiotics: Secondary | ICD-10-CM | POA: Diagnosis not present

## 2016-10-27 DIAGNOSIS — J449 Chronic obstructive pulmonary disease, unspecified: Secondary | ICD-10-CM | POA: Diagnosis not present

## 2016-10-27 DIAGNOSIS — Z466 Encounter for fitting and adjustment of urinary device: Secondary | ICD-10-CM | POA: Diagnosis not present

## 2016-10-27 DIAGNOSIS — I10 Essential (primary) hypertension: Secondary | ICD-10-CM | POA: Diagnosis not present

## 2016-10-27 DIAGNOSIS — N390 Urinary tract infection, site not specified: Secondary | ICD-10-CM | POA: Diagnosis not present

## 2016-10-27 DIAGNOSIS — G629 Polyneuropathy, unspecified: Secondary | ICD-10-CM | POA: Diagnosis not present

## 2016-10-30 DIAGNOSIS — I10 Essential (primary) hypertension: Secondary | ICD-10-CM | POA: Diagnosis not present

## 2016-10-30 DIAGNOSIS — M6282 Rhabdomyolysis: Secondary | ICD-10-CM | POA: Diagnosis not present

## 2016-10-30 DIAGNOSIS — N320 Bladder-neck obstruction: Secondary | ICD-10-CM | POA: Diagnosis not present

## 2016-10-30 DIAGNOSIS — Z792 Long term (current) use of antibiotics: Secondary | ICD-10-CM | POA: Diagnosis not present

## 2016-10-30 DIAGNOSIS — H9190 Unspecified hearing loss, unspecified ear: Secondary | ICD-10-CM | POA: Diagnosis not present

## 2016-10-30 DIAGNOSIS — Z466 Encounter for fitting and adjustment of urinary device: Secondary | ICD-10-CM | POA: Diagnosis not present

## 2016-10-30 DIAGNOSIS — J449 Chronic obstructive pulmonary disease, unspecified: Secondary | ICD-10-CM | POA: Diagnosis not present

## 2016-10-30 DIAGNOSIS — G309 Alzheimer's disease, unspecified: Secondary | ICD-10-CM | POA: Diagnosis not present

## 2016-10-30 DIAGNOSIS — G629 Polyneuropathy, unspecified: Secondary | ICD-10-CM | POA: Diagnosis not present

## 2016-10-30 DIAGNOSIS — M545 Low back pain: Secondary | ICD-10-CM | POA: Diagnosis not present

## 2016-10-30 DIAGNOSIS — N390 Urinary tract infection, site not specified: Secondary | ICD-10-CM | POA: Diagnosis not present

## 2016-10-30 DIAGNOSIS — Z9181 History of falling: Secondary | ICD-10-CM | POA: Diagnosis not present

## 2016-11-01 DIAGNOSIS — M545 Low back pain: Secondary | ICD-10-CM | POA: Diagnosis not present

## 2016-11-01 DIAGNOSIS — G629 Polyneuropathy, unspecified: Secondary | ICD-10-CM | POA: Diagnosis not present

## 2016-11-01 DIAGNOSIS — N320 Bladder-neck obstruction: Secondary | ICD-10-CM | POA: Diagnosis not present

## 2016-11-01 DIAGNOSIS — I10 Essential (primary) hypertension: Secondary | ICD-10-CM | POA: Diagnosis not present

## 2016-11-01 DIAGNOSIS — H9190 Unspecified hearing loss, unspecified ear: Secondary | ICD-10-CM | POA: Diagnosis not present

## 2016-11-01 DIAGNOSIS — Z466 Encounter for fitting and adjustment of urinary device: Secondary | ICD-10-CM | POA: Diagnosis not present

## 2016-11-01 DIAGNOSIS — M6282 Rhabdomyolysis: Secondary | ICD-10-CM | POA: Diagnosis not present

## 2016-11-01 DIAGNOSIS — N390 Urinary tract infection, site not specified: Secondary | ICD-10-CM | POA: Diagnosis not present

## 2016-11-01 DIAGNOSIS — Z792 Long term (current) use of antibiotics: Secondary | ICD-10-CM | POA: Diagnosis not present

## 2016-11-01 DIAGNOSIS — G309 Alzheimer's disease, unspecified: Secondary | ICD-10-CM | POA: Diagnosis not present

## 2016-11-01 DIAGNOSIS — Z9181 History of falling: Secondary | ICD-10-CM | POA: Diagnosis not present

## 2016-11-01 DIAGNOSIS — J449 Chronic obstructive pulmonary disease, unspecified: Secondary | ICD-10-CM | POA: Diagnosis not present

## 2016-11-03 DIAGNOSIS — Z466 Encounter for fitting and adjustment of urinary device: Secondary | ICD-10-CM | POA: Diagnosis not present

## 2016-11-03 DIAGNOSIS — G309 Alzheimer's disease, unspecified: Secondary | ICD-10-CM | POA: Diagnosis not present

## 2016-11-03 DIAGNOSIS — Z9181 History of falling: Secondary | ICD-10-CM | POA: Diagnosis not present

## 2016-11-03 DIAGNOSIS — Z792 Long term (current) use of antibiotics: Secondary | ICD-10-CM | POA: Diagnosis not present

## 2016-11-03 DIAGNOSIS — J449 Chronic obstructive pulmonary disease, unspecified: Secondary | ICD-10-CM | POA: Diagnosis not present

## 2016-11-03 DIAGNOSIS — H9190 Unspecified hearing loss, unspecified ear: Secondary | ICD-10-CM | POA: Diagnosis not present

## 2016-11-03 DIAGNOSIS — N390 Urinary tract infection, site not specified: Secondary | ICD-10-CM | POA: Diagnosis not present

## 2016-11-03 DIAGNOSIS — M545 Low back pain: Secondary | ICD-10-CM | POA: Diagnosis not present

## 2016-11-03 DIAGNOSIS — M6282 Rhabdomyolysis: Secondary | ICD-10-CM | POA: Diagnosis not present

## 2016-11-03 DIAGNOSIS — N320 Bladder-neck obstruction: Secondary | ICD-10-CM | POA: Diagnosis not present

## 2016-11-03 DIAGNOSIS — I10 Essential (primary) hypertension: Secondary | ICD-10-CM | POA: Diagnosis not present

## 2016-11-03 DIAGNOSIS — G629 Polyneuropathy, unspecified: Secondary | ICD-10-CM | POA: Diagnosis not present

## 2016-11-07 DIAGNOSIS — M545 Low back pain: Secondary | ICD-10-CM | POA: Diagnosis not present

## 2016-11-07 DIAGNOSIS — I10 Essential (primary) hypertension: Secondary | ICD-10-CM | POA: Diagnosis not present

## 2016-11-07 DIAGNOSIS — M6282 Rhabdomyolysis: Secondary | ICD-10-CM | POA: Diagnosis not present

## 2016-11-07 DIAGNOSIS — N390 Urinary tract infection, site not specified: Secondary | ICD-10-CM | POA: Diagnosis not present

## 2016-11-07 DIAGNOSIS — H9190 Unspecified hearing loss, unspecified ear: Secondary | ICD-10-CM | POA: Diagnosis not present

## 2016-11-07 DIAGNOSIS — Z466 Encounter for fitting and adjustment of urinary device: Secondary | ICD-10-CM | POA: Diagnosis not present

## 2016-11-07 DIAGNOSIS — G629 Polyneuropathy, unspecified: Secondary | ICD-10-CM | POA: Diagnosis not present

## 2016-11-07 DIAGNOSIS — Z9181 History of falling: Secondary | ICD-10-CM | POA: Diagnosis not present

## 2016-11-07 DIAGNOSIS — Z792 Long term (current) use of antibiotics: Secondary | ICD-10-CM | POA: Diagnosis not present

## 2016-11-07 DIAGNOSIS — G309 Alzheimer's disease, unspecified: Secondary | ICD-10-CM | POA: Diagnosis not present

## 2016-11-07 DIAGNOSIS — N320 Bladder-neck obstruction: Secondary | ICD-10-CM | POA: Diagnosis not present

## 2016-11-07 DIAGNOSIS — J449 Chronic obstructive pulmonary disease, unspecified: Secondary | ICD-10-CM | POA: Diagnosis not present

## 2016-11-09 DIAGNOSIS — G309 Alzheimer's disease, unspecified: Secondary | ICD-10-CM | POA: Diagnosis not present

## 2016-11-09 DIAGNOSIS — Z466 Encounter for fitting and adjustment of urinary device: Secondary | ICD-10-CM | POA: Diagnosis not present

## 2016-11-09 DIAGNOSIS — Z792 Long term (current) use of antibiotics: Secondary | ICD-10-CM | POA: Diagnosis not present

## 2016-11-09 DIAGNOSIS — M545 Low back pain: Secondary | ICD-10-CM | POA: Diagnosis not present

## 2016-11-09 DIAGNOSIS — J449 Chronic obstructive pulmonary disease, unspecified: Secondary | ICD-10-CM | POA: Diagnosis not present

## 2016-11-09 DIAGNOSIS — N320 Bladder-neck obstruction: Secondary | ICD-10-CM | POA: Diagnosis not present

## 2016-11-09 DIAGNOSIS — M6282 Rhabdomyolysis: Secondary | ICD-10-CM | POA: Diagnosis not present

## 2016-11-09 DIAGNOSIS — H9190 Unspecified hearing loss, unspecified ear: Secondary | ICD-10-CM | POA: Diagnosis not present

## 2016-11-09 DIAGNOSIS — Z9181 History of falling: Secondary | ICD-10-CM | POA: Diagnosis not present

## 2016-11-09 DIAGNOSIS — I10 Essential (primary) hypertension: Secondary | ICD-10-CM | POA: Diagnosis not present

## 2016-11-09 DIAGNOSIS — N390 Urinary tract infection, site not specified: Secondary | ICD-10-CM | POA: Diagnosis not present

## 2016-11-09 DIAGNOSIS — G629 Polyneuropathy, unspecified: Secondary | ICD-10-CM | POA: Diagnosis not present

## 2016-11-10 DIAGNOSIS — Z466 Encounter for fitting and adjustment of urinary device: Secondary | ICD-10-CM | POA: Diagnosis not present

## 2016-11-10 DIAGNOSIS — M6282 Rhabdomyolysis: Secondary | ICD-10-CM | POA: Diagnosis not present

## 2016-11-10 DIAGNOSIS — I10 Essential (primary) hypertension: Secondary | ICD-10-CM | POA: Diagnosis not present

## 2016-11-10 DIAGNOSIS — N390 Urinary tract infection, site not specified: Secondary | ICD-10-CM | POA: Diagnosis not present

## 2016-11-10 DIAGNOSIS — G629 Polyneuropathy, unspecified: Secondary | ICD-10-CM | POA: Diagnosis not present

## 2016-11-10 DIAGNOSIS — G309 Alzheimer's disease, unspecified: Secondary | ICD-10-CM | POA: Diagnosis not present

## 2016-11-10 DIAGNOSIS — N320 Bladder-neck obstruction: Secondary | ICD-10-CM | POA: Diagnosis not present

## 2016-11-10 DIAGNOSIS — Z792 Long term (current) use of antibiotics: Secondary | ICD-10-CM | POA: Diagnosis not present

## 2016-11-10 DIAGNOSIS — H9190 Unspecified hearing loss, unspecified ear: Secondary | ICD-10-CM | POA: Diagnosis not present

## 2016-11-10 DIAGNOSIS — M545 Low back pain: Secondary | ICD-10-CM | POA: Diagnosis not present

## 2016-11-10 DIAGNOSIS — J449 Chronic obstructive pulmonary disease, unspecified: Secondary | ICD-10-CM | POA: Diagnosis not present

## 2016-11-10 DIAGNOSIS — Z9181 History of falling: Secondary | ICD-10-CM | POA: Diagnosis not present

## 2016-11-12 DIAGNOSIS — M545 Low back pain: Secondary | ICD-10-CM | POA: Diagnosis not present

## 2016-11-12 DIAGNOSIS — J449 Chronic obstructive pulmonary disease, unspecified: Secondary | ICD-10-CM | POA: Diagnosis not present

## 2016-11-12 DIAGNOSIS — Z9181 History of falling: Secondary | ICD-10-CM | POA: Diagnosis not present

## 2016-11-12 DIAGNOSIS — Z466 Encounter for fitting and adjustment of urinary device: Secondary | ICD-10-CM | POA: Diagnosis not present

## 2016-11-12 DIAGNOSIS — N320 Bladder-neck obstruction: Secondary | ICD-10-CM | POA: Diagnosis not present

## 2016-11-12 DIAGNOSIS — M6282 Rhabdomyolysis: Secondary | ICD-10-CM | POA: Diagnosis not present

## 2016-11-12 DIAGNOSIS — H9190 Unspecified hearing loss, unspecified ear: Secondary | ICD-10-CM | POA: Diagnosis not present

## 2016-11-12 DIAGNOSIS — Z792 Long term (current) use of antibiotics: Secondary | ICD-10-CM | POA: Diagnosis not present

## 2016-11-12 DIAGNOSIS — G309 Alzheimer's disease, unspecified: Secondary | ICD-10-CM | POA: Diagnosis not present

## 2016-11-12 DIAGNOSIS — I10 Essential (primary) hypertension: Secondary | ICD-10-CM | POA: Diagnosis not present

## 2016-11-12 DIAGNOSIS — G629 Polyneuropathy, unspecified: Secondary | ICD-10-CM | POA: Diagnosis not present

## 2016-11-12 DIAGNOSIS — N390 Urinary tract infection, site not specified: Secondary | ICD-10-CM | POA: Diagnosis not present

## 2016-11-14 DIAGNOSIS — Z792 Long term (current) use of antibiotics: Secondary | ICD-10-CM | POA: Diagnosis not present

## 2016-11-14 DIAGNOSIS — Z466 Encounter for fitting and adjustment of urinary device: Secondary | ICD-10-CM | POA: Diagnosis not present

## 2016-11-14 DIAGNOSIS — I10 Essential (primary) hypertension: Secondary | ICD-10-CM | POA: Diagnosis not present

## 2016-11-14 DIAGNOSIS — J449 Chronic obstructive pulmonary disease, unspecified: Secondary | ICD-10-CM | POA: Diagnosis not present

## 2016-11-14 DIAGNOSIS — G309 Alzheimer's disease, unspecified: Secondary | ICD-10-CM | POA: Diagnosis not present

## 2016-11-14 DIAGNOSIS — M545 Low back pain: Secondary | ICD-10-CM | POA: Diagnosis not present

## 2016-11-14 DIAGNOSIS — H9190 Unspecified hearing loss, unspecified ear: Secondary | ICD-10-CM | POA: Diagnosis not present

## 2016-11-14 DIAGNOSIS — G629 Polyneuropathy, unspecified: Secondary | ICD-10-CM | POA: Diagnosis not present

## 2016-11-14 DIAGNOSIS — M6282 Rhabdomyolysis: Secondary | ICD-10-CM | POA: Diagnosis not present

## 2016-11-14 DIAGNOSIS — Z9181 History of falling: Secondary | ICD-10-CM | POA: Diagnosis not present

## 2016-11-14 DIAGNOSIS — N390 Urinary tract infection, site not specified: Secondary | ICD-10-CM | POA: Diagnosis not present

## 2016-11-14 DIAGNOSIS — N320 Bladder-neck obstruction: Secondary | ICD-10-CM | POA: Diagnosis not present

## 2016-11-16 DIAGNOSIS — N390 Urinary tract infection, site not specified: Secondary | ICD-10-CM | POA: Diagnosis not present

## 2016-11-16 DIAGNOSIS — G309 Alzheimer's disease, unspecified: Secondary | ICD-10-CM | POA: Diagnosis not present

## 2016-11-16 DIAGNOSIS — G629 Polyneuropathy, unspecified: Secondary | ICD-10-CM | POA: Diagnosis not present

## 2016-11-16 DIAGNOSIS — H9190 Unspecified hearing loss, unspecified ear: Secondary | ICD-10-CM | POA: Diagnosis not present

## 2016-11-16 DIAGNOSIS — Z792 Long term (current) use of antibiotics: Secondary | ICD-10-CM | POA: Diagnosis not present

## 2016-11-16 DIAGNOSIS — Z9181 History of falling: Secondary | ICD-10-CM | POA: Diagnosis not present

## 2016-11-16 DIAGNOSIS — Z466 Encounter for fitting and adjustment of urinary device: Secondary | ICD-10-CM | POA: Diagnosis not present

## 2016-11-16 DIAGNOSIS — M545 Low back pain: Secondary | ICD-10-CM | POA: Diagnosis not present

## 2016-11-16 DIAGNOSIS — M6282 Rhabdomyolysis: Secondary | ICD-10-CM | POA: Diagnosis not present

## 2016-11-16 DIAGNOSIS — I10 Essential (primary) hypertension: Secondary | ICD-10-CM | POA: Diagnosis not present

## 2016-11-16 DIAGNOSIS — N320 Bladder-neck obstruction: Secondary | ICD-10-CM | POA: Diagnosis not present

## 2016-11-16 DIAGNOSIS — J449 Chronic obstructive pulmonary disease, unspecified: Secondary | ICD-10-CM | POA: Diagnosis not present

## 2016-11-21 DIAGNOSIS — G309 Alzheimer's disease, unspecified: Secondary | ICD-10-CM | POA: Diagnosis not present

## 2016-11-21 DIAGNOSIS — J449 Chronic obstructive pulmonary disease, unspecified: Secondary | ICD-10-CM | POA: Diagnosis not present

## 2016-11-21 DIAGNOSIS — M545 Low back pain: Secondary | ICD-10-CM | POA: Diagnosis not present

## 2016-11-21 DIAGNOSIS — G629 Polyneuropathy, unspecified: Secondary | ICD-10-CM | POA: Diagnosis not present

## 2016-11-21 DIAGNOSIS — I10 Essential (primary) hypertension: Secondary | ICD-10-CM | POA: Diagnosis not present

## 2016-11-21 DIAGNOSIS — N320 Bladder-neck obstruction: Secondary | ICD-10-CM | POA: Diagnosis not present

## 2016-11-21 DIAGNOSIS — Z792 Long term (current) use of antibiotics: Secondary | ICD-10-CM | POA: Diagnosis not present

## 2016-11-21 DIAGNOSIS — Z466 Encounter for fitting and adjustment of urinary device: Secondary | ICD-10-CM | POA: Diagnosis not present

## 2016-11-21 DIAGNOSIS — R05 Cough: Secondary | ICD-10-CM | POA: Diagnosis not present

## 2016-11-21 DIAGNOSIS — Z9181 History of falling: Secondary | ICD-10-CM | POA: Diagnosis not present

## 2016-11-21 DIAGNOSIS — H9190 Unspecified hearing loss, unspecified ear: Secondary | ICD-10-CM | POA: Diagnosis not present

## 2016-11-21 DIAGNOSIS — R5381 Other malaise: Secondary | ICD-10-CM | POA: Diagnosis not present

## 2016-11-21 DIAGNOSIS — N390 Urinary tract infection, site not specified: Secondary | ICD-10-CM | POA: Diagnosis not present

## 2016-11-21 DIAGNOSIS — R269 Unspecified abnormalities of gait and mobility: Secondary | ICD-10-CM | POA: Diagnosis not present

## 2016-11-21 DIAGNOSIS — M6282 Rhabdomyolysis: Secondary | ICD-10-CM | POA: Diagnosis not present

## 2016-11-21 DIAGNOSIS — G301 Alzheimer's disease with late onset: Secondary | ICD-10-CM | POA: Diagnosis not present

## 2016-11-23 DIAGNOSIS — G629 Polyneuropathy, unspecified: Secondary | ICD-10-CM | POA: Diagnosis not present

## 2016-11-23 DIAGNOSIS — H9190 Unspecified hearing loss, unspecified ear: Secondary | ICD-10-CM | POA: Diagnosis not present

## 2016-11-23 DIAGNOSIS — J449 Chronic obstructive pulmonary disease, unspecified: Secondary | ICD-10-CM | POA: Diagnosis not present

## 2016-11-23 DIAGNOSIS — G309 Alzheimer's disease, unspecified: Secondary | ICD-10-CM | POA: Diagnosis not present

## 2016-11-23 DIAGNOSIS — Z9181 History of falling: Secondary | ICD-10-CM | POA: Diagnosis not present

## 2016-11-23 DIAGNOSIS — N320 Bladder-neck obstruction: Secondary | ICD-10-CM | POA: Diagnosis not present

## 2016-11-23 DIAGNOSIS — M545 Low back pain: Secondary | ICD-10-CM | POA: Diagnosis not present

## 2016-11-23 DIAGNOSIS — Z466 Encounter for fitting and adjustment of urinary device: Secondary | ICD-10-CM | POA: Diagnosis not present

## 2016-11-23 DIAGNOSIS — I10 Essential (primary) hypertension: Secondary | ICD-10-CM | POA: Diagnosis not present

## 2016-11-23 DIAGNOSIS — Z792 Long term (current) use of antibiotics: Secondary | ICD-10-CM | POA: Diagnosis not present

## 2016-11-23 DIAGNOSIS — M6282 Rhabdomyolysis: Secondary | ICD-10-CM | POA: Diagnosis not present

## 2016-11-23 DIAGNOSIS — N390 Urinary tract infection, site not specified: Secondary | ICD-10-CM | POA: Diagnosis not present

## 2016-11-29 DIAGNOSIS — M545 Low back pain: Secondary | ICD-10-CM | POA: Diagnosis not present

## 2016-11-29 DIAGNOSIS — H9190 Unspecified hearing loss, unspecified ear: Secondary | ICD-10-CM | POA: Diagnosis not present

## 2016-11-29 DIAGNOSIS — Z9181 History of falling: Secondary | ICD-10-CM | POA: Diagnosis not present

## 2016-11-29 DIAGNOSIS — G629 Polyneuropathy, unspecified: Secondary | ICD-10-CM | POA: Diagnosis not present

## 2016-11-29 DIAGNOSIS — G309 Alzheimer's disease, unspecified: Secondary | ICD-10-CM | POA: Diagnosis not present

## 2016-11-29 DIAGNOSIS — M6282 Rhabdomyolysis: Secondary | ICD-10-CM | POA: Diagnosis not present

## 2016-11-29 DIAGNOSIS — J449 Chronic obstructive pulmonary disease, unspecified: Secondary | ICD-10-CM | POA: Diagnosis not present

## 2016-11-29 DIAGNOSIS — Z466 Encounter for fitting and adjustment of urinary device: Secondary | ICD-10-CM | POA: Diagnosis not present

## 2016-11-29 DIAGNOSIS — N320 Bladder-neck obstruction: Secondary | ICD-10-CM | POA: Diagnosis not present

## 2016-11-29 DIAGNOSIS — I10 Essential (primary) hypertension: Secondary | ICD-10-CM | POA: Diagnosis not present

## 2016-11-29 DIAGNOSIS — Z792 Long term (current) use of antibiotics: Secondary | ICD-10-CM | POA: Diagnosis not present

## 2016-11-29 DIAGNOSIS — N390 Urinary tract infection, site not specified: Secondary | ICD-10-CM | POA: Diagnosis not present

## 2016-12-01 DIAGNOSIS — H9190 Unspecified hearing loss, unspecified ear: Secondary | ICD-10-CM | POA: Diagnosis not present

## 2016-12-01 DIAGNOSIS — G309 Alzheimer's disease, unspecified: Secondary | ICD-10-CM | POA: Diagnosis not present

## 2016-12-01 DIAGNOSIS — M6282 Rhabdomyolysis: Secondary | ICD-10-CM | POA: Diagnosis not present

## 2016-12-01 DIAGNOSIS — Z792 Long term (current) use of antibiotics: Secondary | ICD-10-CM | POA: Diagnosis not present

## 2016-12-01 DIAGNOSIS — Z9181 History of falling: Secondary | ICD-10-CM | POA: Diagnosis not present

## 2016-12-01 DIAGNOSIS — N320 Bladder-neck obstruction: Secondary | ICD-10-CM | POA: Diagnosis not present

## 2016-12-01 DIAGNOSIS — I10 Essential (primary) hypertension: Secondary | ICD-10-CM | POA: Diagnosis not present

## 2016-12-01 DIAGNOSIS — Z466 Encounter for fitting and adjustment of urinary device: Secondary | ICD-10-CM | POA: Diagnosis not present

## 2016-12-01 DIAGNOSIS — N390 Urinary tract infection, site not specified: Secondary | ICD-10-CM | POA: Diagnosis not present

## 2016-12-01 DIAGNOSIS — M545 Low back pain: Secondary | ICD-10-CM | POA: Diagnosis not present

## 2016-12-01 DIAGNOSIS — G629 Polyneuropathy, unspecified: Secondary | ICD-10-CM | POA: Diagnosis not present

## 2016-12-01 DIAGNOSIS — J449 Chronic obstructive pulmonary disease, unspecified: Secondary | ICD-10-CM | POA: Diagnosis not present

## 2016-12-07 DIAGNOSIS — M545 Low back pain: Secondary | ICD-10-CM | POA: Diagnosis not present

## 2016-12-07 DIAGNOSIS — J449 Chronic obstructive pulmonary disease, unspecified: Secondary | ICD-10-CM | POA: Diagnosis not present

## 2016-12-07 DIAGNOSIS — Z466 Encounter for fitting and adjustment of urinary device: Secondary | ICD-10-CM | POA: Diagnosis not present

## 2016-12-07 DIAGNOSIS — G309 Alzheimer's disease, unspecified: Secondary | ICD-10-CM | POA: Diagnosis not present

## 2016-12-07 DIAGNOSIS — H9190 Unspecified hearing loss, unspecified ear: Secondary | ICD-10-CM | POA: Diagnosis not present

## 2016-12-07 DIAGNOSIS — M6282 Rhabdomyolysis: Secondary | ICD-10-CM | POA: Diagnosis not present

## 2016-12-07 DIAGNOSIS — I10 Essential (primary) hypertension: Secondary | ICD-10-CM | POA: Diagnosis not present

## 2016-12-07 DIAGNOSIS — G629 Polyneuropathy, unspecified: Secondary | ICD-10-CM | POA: Diagnosis not present

## 2016-12-07 DIAGNOSIS — Z9181 History of falling: Secondary | ICD-10-CM | POA: Diagnosis not present

## 2016-12-07 DIAGNOSIS — Z792 Long term (current) use of antibiotics: Secondary | ICD-10-CM | POA: Diagnosis not present

## 2016-12-07 DIAGNOSIS — N320 Bladder-neck obstruction: Secondary | ICD-10-CM | POA: Diagnosis not present

## 2016-12-07 DIAGNOSIS — N390 Urinary tract infection, site not specified: Secondary | ICD-10-CM | POA: Diagnosis not present

## 2016-12-12 DIAGNOSIS — N390 Urinary tract infection, site not specified: Secondary | ICD-10-CM | POA: Diagnosis not present

## 2016-12-12 DIAGNOSIS — M6281 Muscle weakness (generalized): Secondary | ICD-10-CM | POA: Diagnosis not present

## 2016-12-12 DIAGNOSIS — G301 Alzheimer's disease with late onset: Secondary | ICD-10-CM | POA: Diagnosis not present

## 2016-12-12 DIAGNOSIS — R339 Retention of urine, unspecified: Secondary | ICD-10-CM | POA: Diagnosis not present

## 2016-12-19 ENCOUNTER — Encounter (HOSPITAL_COMMUNITY): Payer: Self-pay | Admitting: *Deleted

## 2016-12-19 ENCOUNTER — Emergency Department (HOSPITAL_COMMUNITY)
Admission: EM | Admit: 2016-12-19 | Discharge: 2016-12-19 | Disposition: A | Discharge status: Home / Self Care | Payer: Medicare Other | Attending: Emergency Medicine | Admitting: Emergency Medicine

## 2016-12-19 DIAGNOSIS — N390 Urinary tract infection, site not specified: Secondary | ICD-10-CM | POA: Diagnosis not present

## 2016-12-19 DIAGNOSIS — I482 Chronic atrial fibrillation: Secondary | ICD-10-CM | POA: Diagnosis not present

## 2016-12-19 DIAGNOSIS — I739 Peripheral vascular disease, unspecified: Secondary | ICD-10-CM | POA: Diagnosis not present

## 2016-12-19 DIAGNOSIS — R319 Hematuria, unspecified: Secondary | ICD-10-CM

## 2016-12-19 DIAGNOSIS — R627 Adult failure to thrive: Secondary | ICD-10-CM | POA: Diagnosis not present

## 2016-12-19 DIAGNOSIS — T83091A Other mechanical complication of indwelling urethral catheter, initial encounter: Secondary | ICD-10-CM | POA: Diagnosis not present

## 2016-12-19 DIAGNOSIS — Z87891 Personal history of nicotine dependence: Secondary | ICD-10-CM | POA: Insufficient documentation

## 2016-12-19 DIAGNOSIS — I10 Essential (primary) hypertension: Secondary | ICD-10-CM | POA: Diagnosis not present

## 2016-12-19 DIAGNOSIS — M810 Age-related osteoporosis without current pathological fracture: Secondary | ICD-10-CM | POA: Diagnosis not present

## 2016-12-19 DIAGNOSIS — T83028A Displacement of other indwelling urethral catheter, initial encounter: Secondary | ICD-10-CM | POA: Insufficient documentation

## 2016-12-19 DIAGNOSIS — R488 Other symbolic dysfunctions: Secondary | ICD-10-CM | POA: Diagnosis not present

## 2016-12-19 DIAGNOSIS — L03818 Cellulitis of other sites: Secondary | ICD-10-CM | POA: Diagnosis not present

## 2016-12-19 DIAGNOSIS — M6281 Muscle weakness (generalized): Secondary | ICD-10-CM | POA: Diagnosis not present

## 2016-12-19 DIAGNOSIS — Z79899 Other long term (current) drug therapy: Secondary | ICD-10-CM | POA: Diagnosis not present

## 2016-12-19 DIAGNOSIS — G629 Polyneuropathy, unspecified: Secondary | ICD-10-CM | POA: Diagnosis not present

## 2016-12-19 DIAGNOSIS — Y738 Miscellaneous gastroenterology and urology devices associated with adverse incidents, not elsewhere classified: Secondary | ICD-10-CM | POA: Insufficient documentation

## 2016-12-19 DIAGNOSIS — R531 Weakness: Secondary | ICD-10-CM | POA: Diagnosis not present

## 2016-12-19 DIAGNOSIS — T83021A Displacement of indwelling urethral catheter, initial encounter: Secondary | ICD-10-CM

## 2016-12-19 DIAGNOSIS — R8290 Unspecified abnormal findings in urine: Secondary | ICD-10-CM | POA: Diagnosis not present

## 2016-12-19 DIAGNOSIS — L603 Nail dystrophy: Secondary | ICD-10-CM | POA: Diagnosis not present

## 2016-12-19 LAB — URINALYSIS, ROUTINE W REFLEX MICROSCOPIC
BILIRUBIN URINE: NEGATIVE
Glucose, UA: NEGATIVE mg/dL
KETONES UR: NEGATIVE mg/dL
Nitrite: NEGATIVE
Protein, ur: 30 mg/dL — AB
SPECIFIC GRAVITY, URINE: 1.014 (ref 1.005–1.030)
pH: 6 (ref 5.0–8.0)

## 2016-12-19 MED ORDER — CEPHALEXIN 500 MG PO CAPS: 500.0000 mg | ORAL_CAPSULE | Freq: Two times a day (BID) | ORAL | 0 refills | Status: DC

## 2016-12-19 MED ORDER — CEPHALEXIN 500 MG PO CAPS
500.0000 mg | ORAL_CAPSULE | Freq: Once | ORAL | Status: AC
  Administered 2016-12-19: 500 mg via ORAL
  Filled 2016-12-19: qty 1

## 2016-12-19 NOTE — ED Notes (Signed)
[facility]ave [facility]. RN verbalized understanding of discharge instructions

## 2016-12-19 NOTE — ED Notes (Signed)
Pt ambulated to restroom and voided 75 cc of cloudy yellow urine

## 2016-12-19 NOTE — ED Notes (Signed)
Pt resting comfortably, denies pain

## 2016-12-19 NOTE — ED Notes (Signed)
Received a call from Burna MortimerWanda [facility]) notifying it was ok to send pt to their facility upon discharge. Burna MortimerWanda stated she will update pt's son Fayrene FearingJames.

## 2016-12-19 NOTE — ED Notes (Signed)
Pt ambulated to bathroom with stand by assistance, was able to urinate. Sample collected and Dr Clarene DukeLittle made aware. Per dr Clarene DukeLittle we are to hold of on inserting foley and check if pt will urinate again after oral intake.

## 2016-12-19 NOTE — Progress Notes (Signed)
CSW received a call [facility] stating that the pt has been accepted  And can be transported when pt is discharged.  CSW confirmed with EDP pt is ready for discharge and informed pt's RN and provided RN with number for report.  CSW called pt's son Fayrene FearingJames and pt's daughter Nicholos JohnsKathleen, informed them of discharge, and was told that pt's other daughter Larita FifeLynn would be at [facility] when [facility].  CSW discussed D/C with pt and pt was agreeable to the plan.  CSW faxed [facility] pt's AVS.  RN confirmed she would call [facility] when pt is ready for D/C. Please reconsult if future social work needs arise.  CSW signing off.   Dorothe PeaJonathan F. Tashe Purdon, Theresia MajorsLCSWA, LCAS Clinical Social Worker Ph: [phone]

## 2016-12-19 NOTE — ED Notes (Signed)
Bed: [facility] Expected date:  Expected time:  Means of arrival:  Comments: 

## 2016-12-19 NOTE — Evaluation (Signed)
Physical Therapy Evaluation Patient Details Name: Lori [name] MRN: [id number] DOB: Apr 19, 1920 Today's Date: 12/19/2016   History of Present Illness  83 yo female admitted with catheter dislodgement. Hx of dementia, hearing loss, osteoporosis, HTN, back pain, neuropathy, A fib. Pt is from [facility]     Clinical Impression  On eval, pt required Min guard-Min assist for ambulation. She walked ~150 feet with 1 HHA vs no device/equipment. She was intermittently unsteady during ambulation, requiring a small amount of assist to stabilize at times. Pt did report she uses a walker "but not all the time." No family present during session. Per chart, pt is unable to return to her ALF. Feel pt could benefit from a short term rehab to stay to improve balance and overall safety with mobility. Will follow.     Follow Up Recommendations SNF    Equipment Recommendations  None recommended by PT    Recommendations for Other Services       Precautions / Restrictions Precautions Precautions: Fall Restrictions Weight Bearing Restrictions: No      Mobility  Bed Mobility Overal bed mobility: Modified Independent                Transfers Overall transfer level: Needs assistance   Transfers: Sit to/from Stand Sit to Stand: Min guard         General transfer comment: close guard for safety. Unsteady initially.   Ambulation/Gait Ambulation/Gait assistance: Min guard;Min assist Ambulation Distance (Feet): 150 Feet Assistive device: 1 person hand held assist;None Gait Pattern/deviations: Step-through pattern;Decreased stride length;Decreased step length - left;Decreased step length - right;Staggering left;Staggering right;Drifts right/left;Trunk flexed     General Gait Details: Intermittently, pt required Min assist to stabilize. No overt LOB.   Stairs            Wheelchair Mobility    Modified Rankin (Stroke Patients Only)       Balance Overall balance  assessment: Needs assistance;History of Falls             Standing balance comment: Had pt perform static standing with EO/EC, narrow BOS-Min guard assist. 360 degree turn with Min guard assist and increased time.                              Pertinent Vitals/Pain Pain Assessment: No/denies pain    Home Living Family/patient expects to be discharged to:: Unsure                      Prior Function Level of Independence: Needs assistance   Gait / Transfers Assistance Needed: per pt, she uses a walker ("but not all the time")  ADL's / Homemaking Assistance Needed: per pt, she performs  her own bathing        Hand Dominance        Extremity/Trunk Assessment   Upper Extremity Assessment Upper Extremity Assessment: Overall WFL for tasks assessed    Lower Extremity Assessment Lower Extremity Assessment: Generalized weakness    Cervical / Trunk Assessment Cervical / Trunk Assessment: Kyphotic  Communication   Communication: HOH  Cognition Arousal/Alertness: Awake/alert Behavior During Therapy: WFL for tasks assessed/performed Overall Cognitive Status: History of cognitive impairments - at baseline                      General Comments      Exercises     Assessment/Plan    PT Assessment  Patient needs continued PT services  PT Problem List Decreased strength;Decreased balance;Decreased mobility          PT Treatment Interventions Gait training;Therapeutic exercise;Balance training;Functional mobility training;Therapeutic activities;Patient/family education;DME instruction    PT Goals (Current goals can be found in the Care Plan section)  Acute Rehab PT Goals Patient Stated Goal: to go home PT Goal Formulation: With patient Time For Goal Achievement: 01/02/17 Potential to Achieve Goals: Fair    Frequency Min 2X/week   Barriers to discharge        Co-evaluation               End of Session Equipment Utilized During  Treatment: Gait belt Activity Tolerance: Patient tolerated treatment well Patient left: in bed (in hallway of ED)      Functional Assessment Tool Used: clinical judgement Functional Limitation: Mobility: Walking and moving around Mobility: Walking and Moving Around Current Status (Z6109(G8978): At least 1 percent but less than 20 percent impaired, limited or restricted Mobility: Walking and Moving Around Goal Status [id number](G8979): At least 1 percent but less than 20 percent impaired, limited or restricted    Time: [id number] PT Time Calculation (min) (ACUTE ONLY): 8 min   Charges:   PT Evaluation $PT Eval Low Complexity: 1 Procedure     PT G Codes:   PT G-Codes **NOT FOR INPATIENT CLASS** Functional Assessment Tool Used: clinical judgement Functional Limitation: Mobility: Walking and moving around Mobility: Walking and Moving Around Current Status (N8295(G8978): At least 1 percent but less than 20 percent impaired, limited or restricted Mobility: Walking and Moving Around Goal Status [phone](G8979): At least 1 percent but less than 20 percent impaired, limited or restricted    Rebeca AlertJannie Taray Normoyle, MPT Pager: [phone]

## 2016-12-19 NOTE — NC FL2 (Signed)
[place] MEDICAID FL2 LEVEL OF CARE SCREENING TOOL     IDENTIFICATION  Patient Name: Lori [name] Birthdate: 01/06/1920 Sex: female Admission Date (Current Location): 12/19/2016  [facility] [place]   Facility and Address:  [facility],  [address], [place] [id number]      Provider Number: [id number]  Attending Physician Name and Address:  Laurence Spatesachel Stagner Little, MD  Relative Name and Phone Number:       Current Level of Care: Hospital Recommended Level of Care: Skilled Nursing Facility Prior Approval Number:    Date Approved/Denied:   PASRR Number:  [id number]  Discharge Plan: SNF    Current Diagnoses: Patient Active Problem List   Diagnosis Date Noted  . Atrial fibrillation with RVR (HCC)   . Septic shock (HCC) 10/03/2016  . Sepsis (HCC) 10/03/2016  . Acute kidney injury (HCC) 10/03/2016  . Fall 10/03/2016  . Elevated troponin 10/03/2016  . Lactic acidosis 10/03/2016  . Rhabdomyolysis 10/03/2016  . Dementia 10/03/2016  . Acute cystitis 01/14/2015  . Tremor, essential 06/30/2014  . Inflamed skin tag 09/01/2013  . Memory change   . Hallucinations 01/03/2013  . UTI (urinary tract infection)   . Rhinitis, nonallergic, chronic 11/22/2012  . Pernicious anemia 11/18/2012  . Neuropathy (HCC) 11/18/2012  . Corn of toe 11/18/2012  . Hypertension 06/13/2012  . Chronic obstructive airway disease with asthma (HCC) 10/26/2010  . CERUMEN IMPACTION, BILATERAL 10/05/2008  . BACK PAIN, ACUTE 01/10/2008  . MIGRAINE HEADACHE 07/23/2007  . LOSS, HEARING NEC 07/23/2007  . STENOSIS, MITRAL 07/23/2007  . OSTEOPOROSIS 07/23/2007  . EDEMA 07/23/2007    Orientation RESPIRATION BLADDER Height & Weight     Time, Situation, Place, Self  Normal Incontinent Weight:   Height:     BEHAVIORAL SYMPTOMS/MOOD NEUROLOGICAL:Oriented to time, situation, place and person BOWEL NUTRITION STATUS        Diet (Regular)  AMBULATORY STATUS  COMMUNICATION OF NEEDS Skin   Limited Assist Verbally Normal                       Personal Care Assistance Level of Assistance  Bathing, Dressing, Feeding Bathing Assistance: Limited assistance Feeding assistance: Independent Dressing Assistance: Limited assistance     Functional Limitations Info    Sight Info: Adequate        SPECIAL CARE FACTORS FREQUENCY  PT (By licensed PT), OT (By licensed OT)     PT Frequency: 5 OT Frequency: 5            Contractures      Additional Factors Info  Code Status, Allergies Code Status Info: Prior Allergies Info: NKA           Current Medications (12/19/2016):  This is the current hospital active medication list No current facility-administered medications for this encounter.    Current Outpatient Prescriptions  Medication Sig Dispense Refill  . cefUROXime (CEFTIN) 250 MG tablet Take 1 tablet (250 mg total) by mouth 2 (two) times daily with a meal. 8 tablet 0  . cyanocobalamin (,VITAMIN B-12,) 1000 MCG/ML injection INJECT 1ML EVERY 30 DAYS 30 mL 1  . donepezil (ARICEPT) 10 MG tablet Take 1 tablet (10 mg total) by mouth daily. 90 tablet 4  . guaiFENesin (MUCINEX) 600 MG 12 hr tablet Take 600 mg by mouth 2 (two) times daily.    Marland Kitchen. loperamide (IMODIUM) 2 MG capsule Take 1 capsule (2 mg total) by mouth 4 (four) times daily as needed  for diarrhea or loose stools. (Patient taking differently: Take 2 mg by mouth every 6 (six) hours as needed for diarrhea or loose stools. ) 20 capsule 0  . memantine (NAMENDA) 5 MG tablet Take 5-10 mg by mouth 2 (two) times daily. Takes 5 mg in the morning and 10 mg at bedtime       Discharge Medications: Please see discharge summary for a list of discharge medications.  Relevant Imaging Results:  Relevant Lab Results:   Additional Information [id number]  Dorothe Pea Betsaida [place], LCSWA

## 2016-12-19 NOTE — ED Notes (Signed)
Called [facility] for transport request

## 2016-12-19 NOTE — Progress Notes (Signed)
CSW contacted by patient's RN. Patient's RN reports that [facility] and that [facility] and [facility] [facility] [facility]. Patient's RN reports that [facility] not have all of the patient's information [facility] and is requesting that CSW send the patient's information via the HUB. CS[facility] and spoke with staff named Bjorn Loserhonda, staff confirmed patient transferring to facility and requested that CSW send FL2 via the hub, CSW agreed.

## 2016-12-19 NOTE — ED Notes (Signed)
Spoke to pt's son Fayrene FearingJames regarding discharge arrangement, he gave me number for admission liaison at [facility], Wanda [phone] who sts they still haven't received all the paperwork necessary to move pt from one facility to other. She requested that we send her information through HUB. Consulted social worker Cala BradfordKimberly who will speak to [facility].

## 2016-12-19 NOTE — ED Provider Notes (Signed)
[facility]-EMERGENCY DEPT Provider Note   CSN: [id number] Arrival date & time: 12/19/16 1133     History    Chief Complaint  Patient presents with  . Catheter replacement     HPI Lori [name] is a 83 y.o. female.  83yo F w/ PMH including dementia, A fib who p/w foley catheter dislodgement. Per EMS, pt sent from nursing facility where today she pulled out her foley catheter w/ balloon inflated. She has not had any significant bleeding. Pt denies any complaints of pain.  LEVEL 5 CAVEAT DUE TO DEMENTIA  Past Medical History:  Diagnosis Date  . Cataract    bilateral  . Hearing loss   . Memory change   . Migraine   . Mitral stenosis   . Osteoporosis   . Pneumonia   . UTI (urinary tract infection)      Patient Active Problem List   Diagnosis Date Noted  . Atrial fibrillation with RVR (HCC)   . Septic shock (HCC) 10/03/2016  . Sepsis (HCC) 10/03/2016  . Acute kidney injury (HCC) 10/03/2016  . Fall 10/03/2016  . Elevated troponin 10/03/2016  . Lactic acidosis 10/03/2016  . Rhabdomyolysis 10/03/2016  . Dementia 10/03/2016  . Acute cystitis 01/14/2015  . Tremor, essential 06/30/2014  . Inflamed skin tag 09/01/2013  . Memory change   . Hallucinations 01/03/2013  . UTI (urinary tract infection)   . Rhinitis, nonallergic, chronic 11/22/2012  . Pernicious anemia 11/18/2012  . Neuropathy (HCC) 11/18/2012  . Corn of toe 11/18/2012  . Hypertension 06/13/2012  . Chronic obstructive airway disease with asthma (HCC) 10/26/2010  . CERUMEN IMPACTION, BILATERAL 10/05/2008  . BACK PAIN, ACUTE 01/10/2008  . MIGRAINE HEADACHE 07/23/2007  . LOSS, HEARING NEC 07/23/2007  . STENOSIS, MITRAL 07/23/2007  . OSTEOPOROSIS 07/23/2007  . EDEMA 07/23/2007    Past Surgical History:  Procedure Laterality Date  . ABDOMINAL HYSTERECTOMY    . APPENDECTOMY    . BILATERAL SALPINGOOPHORECTOMY    . EYE SURGERY     bilateral  . TUBAL LIGATION      OB History    No data available          Home Medications    Prior to Admission medications   Medication Sig Start Date End Date Taking? Authorizing Provider  cefUROXime (CEFTIN) 250 MG tablet Take 1 tablet (250 mg total) by mouth 2 (two) times daily with a meal. 10/06/16   Jerald Kief, MD  cyanocobalamin (,VITAMIN B-12,) 1000 MCG/ML injection INJECT EVERY 30 DAYS 10/11/16   Roderick Pee, MD  donepezil (ARICEPT) 10 MG tablet Take 1 tablet (10 mg total) by mouth daily. 07/31/16   Levert Feinstein, MD  guaiFENesin (MUCINEX) 600 MG 12 hr tablet Take 600 mg by mouth 2 (two) times daily.    Historical Provider, MD  loperamide (IMODIUM) 2 MG capsule Take 1 capsule (2 mg total) by mouth 4 (four) times daily as needed for diarrhea or loose stools. Patient taking differently: Take 2 mg by mouth every 6 (six) hours as needed for diarrhea or loose stools.  09/27/16   Mancel Bale, MD  memantine (NAMENDA) 5 MG tablet Take 5-10 mg by mouth 2 (two) times daily. Takes 5 mg in the morning and 10 mg at bedtime    Historical Provider, MD      Family History  Problem Relation Age of Onset  . Heart Problems Mother   . Heart Problems Father      Social History  Substance Use  Topics  . Smoking status: Former Smoker    Packs/day: 0.50    Years: 24.00    Types: Cigarettes    Quit date: 11/13/1965  . Smokeless tobacco: Never Used  . Alcohol use 0.6 oz/week    1 Glasses of wine per week     Comment: occ     Allergies     Patient has no known allergies.    Review of Systems  Unable to obtain ROS 2/2 dementia   Physical Exam Updated Vital Signs BP 122/58 (BP Location: Right Arm)   Pulse 63   Temp 98.1 F (36.7 C) (Oral)   SpO2 98%   Physical Exam  Constitutional: No distress.  Frail, elderly woman awake and alert  HENT:  Head: Normocephalic and atraumatic.  Moist mucous membranes  Eyes: Conjunctivae are normal. Pupils are equal, round, and reactive to light.  Neck: Neck supple.  Cardiovascular: Normal rate,  regular rhythm and normal heart sounds.   No murmur heard. Pulmonary/Chest: Effort normal and breath sounds normal.  Abdominal: Soft. Bowel sounds are normal. She exhibits no distension. There is no tenderness.  Musculoskeletal: She exhibits no edema.  Neurological: She is alert.  Oriented to person, follows basic commands Hard of hearing  Skin: Skin is warm and dry.  Psychiatric:  Calm, cooperative  Nursing note and vitals reviewed.     ED Treatments / Results  Labs (all labs ordered are listed, but only abnormal results are displayed) Labs Reviewed  URINALYSIS, ROUTINE W REFLEX MICROSCOPIC - Abnormal; Notable for the following:       Result Value   APPearance TURBID (*)    Hgb urine dipstick LARGE (*)    Protein, ur 30 (*)    Leukocytes, UA LARGE (*)    Bacteria, UA RARE (*)    Squamous Epithelial / LPF 0-5 (*)    All other components within normal limits  URINE CULTURE     EKG  EKG Interpretation  Date/Time:    Ventricular Rate:    PR Interval:    QRS Duration:   QT Interval:    QTC Calculation:   R Axis:     Text Interpretation:           Radiology No results found.  Procedures Procedures (including critical care time) Procedures  Medications Ordered in ED  Medications  cephALEXin (KEFLEX) capsule 500 mg (not administered)     Initial Impression / Assessment and Plan / ED Course  I have reviewed the triage vital signs and the nursing notes.      PT sent from nursing facility after she pulled out her foley catheter. Comfortable on exam w/ normal VS. No abd tenderness. She was able to void spontaneously and her urine was very cloudy therefore obtained UA. UA is consistent with infection. Gave Keflex. The patient is otherwise comfortable, afebrile, and with normal vital signs therefore I feel she is safe for outpatient treatment of UTI. Nurse and social worker have discussed with family and the patient's care will be transferred to [facility]. We will have another trial of spontaneous voiding and is unable to we will replace Foley catheter. Pt will be discharged to new nursing facility.    Final Clinical Impressions(s) / ED Diagnoses   Final diagnoses:  None     New Prescriptions   No medications on file       Laurence Spatesachel Zullo Laurelai Lepp, MD 12/19/16 [phone]

## 2016-12-19 NOTE — Discharge Instructions (Signed)
Return immediately if any fever, vomiting, confusion, lethargy, or abdominal pain.  Take Keflex as instructed to treat urinary tract infection.

## 2016-12-19 NOTE — ED Triage Notes (Signed)
Per EMS pt sent from [facility] after she pulled out foley catheter with inflated balloon. Per EMS no bleeding. EMS also reports they were told by the staff member that pt can not return to their facility, unsure of the details, since they only spoke with dietary staff. Will call the facility to find out more.

## 2016-12-20 LAB — URINE CULTURE: Special Requests: NORMAL

## 2016-12-21 ENCOUNTER — Non-Acute Institutional Stay (SKILLED_NURSING_FACILITY): Payer: Medicare Other | Admitting: Internal Medicine

## 2016-12-21 ENCOUNTER — Encounter: Payer: Self-pay | Admitting: Internal Medicine

## 2016-12-21 DIAGNOSIS — R8290 Unspecified abnormal findings in urine: Secondary | ICD-10-CM | POA: Diagnosis not present

## 2016-12-21 DIAGNOSIS — L03818 Cellulitis of other sites: Secondary | ICD-10-CM | POA: Diagnosis not present

## 2016-12-21 DIAGNOSIS — R627 Adult failure to thrive: Secondary | ICD-10-CM | POA: Insufficient documentation

## 2016-12-21 NOTE — Assessment & Plan Note (Signed)
PT/OT if dementia allows participation

## 2016-12-21 NOTE — Patient Instructions (Signed)
See assessment and plan under each diagnosis in the problem list and acutely for this visit 

## 2016-12-21 NOTE — Progress Notes (Signed)
   [facility] Room: 112  PCP: TODD,JEFFREY ALLEN, MD [address] [place] [id number]    This is a comprehensive admiEvette Georgesssion note to [facility] performed on this date less than 30 days from date of admission. Included are preadmission medical/surgical history;reconciled medication list; family history; social history and comprehensive review of systems.  Corrections and additions to the records were documented . Comprehensive physical exam was also performed. Additionally a clinical summary was entered for each active diagnosis pertinent to this admission in the Problem List to enhance continuity of care.   HPI: The patient was seen in the emergency room 12/19/16, [facility] after pulling out her Foley catheter with the balloon inflated. The patient was able to void spontaneously ,but urine was cloudy .The urinalysis revealed large amount of blood and large leukocytes. She had rare bacteria and negative nitrites. Keflex was initiated; but urine culture now reveals multiple species suggesting contamination  The patient was discharged from the emergency room and admitted to this SNF The patient had been hospitalized 11/21-11/24/17 also [facility] after being found on the floor o her room. A week prior to that admission she apparently had had diarrhea and clinically was felt to be dehydrated. Additionally apparently the patient was having dysuria and increased frequency. Unfortunately patient has dementia and could not give a good history. She was presumed to have UTI with sepsis; but culture of the urine revealed no definite organism.Acute renal injury was felt to be related to rhabdomyolysis. The patient was discharged on cefuroxime at that time  Past medical and surgical history: Includes osteoporosis and dementia. Surgeries include abdominal hysterectomy.  Social history: Reviewed  Family history: Reviewed  Review of  systems:Could not be completed due to dementia. Patient not oriented to place, date, or president.  Physical exam:  Pertinent or positive findings: Despite bilateral hearing aids she is profoundly deaf. Stare is prominent; there is no definite proptosis. Breath sounds are decreased and heart sounds are distant and slightly irregular. Pedal pulses are decreased. The right superior auricle is faintly erythematous. There is a crusted lesion centrally. There is no active purulence.  General appearance:Thin but adequately nourished; no acute distress , increased work of breathing is present.   Lymphatic: No lymphadenopathy about the head, neck, axilla . Eyes: No conjunctival inflammation or lid edema is present. There is no scleral icterus. Ears:  External ear exam shows no significant lesions or deformities.   Nose:  External nasal examination shows no deformity or inflammation. Nasal mucosa are pink and moist without lesions ,exudates Oral exam: lips and gums are healthy appearing.There is no oropharyngeal erythema or exudate . Neck:  No thyromegaly, masses, tenderness noted.    Heart:  Normal rate and regular rhythm. S1 and S2 normal without gallop, murmur, click, rub .  Lungs:Chest clear to auscultation without wheezes, rhonchi,rales , rubs. Abdomen:Bowel sounds are normal. Abdomen is soft and nontender with no organomegaly, hernias,masses. GU: deferred as previously addressed. Extremities:  No cyanosis, clubbing,edema  Neurologic exam : Balance,Rhomberg,finger to nose testing could not be completed due to clinical state Skin: Warm & dry w/o tenting. No significant rash.  See clinical summary under each active problem in the Problem List with associated updated therapeutic plan Antibiotics will be discontinued based on the culture results revealing multiple species.

## 2016-12-29 DIAGNOSIS — L603 Nail dystrophy: Secondary | ICD-10-CM | POA: Diagnosis not present

## 2016-12-29 DIAGNOSIS — I739 Peripheral vascular disease, unspecified: Secondary | ICD-10-CM | POA: Diagnosis not present

## 2017-01-11 ENCOUNTER — Emergency Department (HOSPITAL_COMMUNITY): Payer: Medicare Other

## 2017-01-11 ENCOUNTER — Encounter (HOSPITAL_COMMUNITY): Payer: Self-pay | Admitting: Emergency Medicine

## 2017-01-11 ENCOUNTER — Emergency Department (HOSPITAL_COMMUNITY)
Admission: EM | Admit: 2017-01-11 | Discharge: 2017-01-11 | Disposition: A | Discharge status: Home / Self Care | Payer: Medicare Other | Attending: Emergency Medicine | Admitting: Emergency Medicine

## 2017-01-11 DIAGNOSIS — Y939 Activity, unspecified: Secondary | ICD-10-CM | POA: Diagnosis not present

## 2017-01-11 DIAGNOSIS — I499 Cardiac arrhythmia, unspecified: Secondary | ICD-10-CM | POA: Diagnosis not present

## 2017-01-11 DIAGNOSIS — S0101XA Laceration without foreign body of scalp, initial encounter: Secondary | ICD-10-CM | POA: Insufficient documentation

## 2017-01-11 DIAGNOSIS — Z79899 Other long term (current) drug therapy: Secondary | ICD-10-CM | POA: Diagnosis not present

## 2017-01-11 DIAGNOSIS — Y999 Unspecified external cause status: Secondary | ICD-10-CM | POA: Diagnosis not present

## 2017-01-11 DIAGNOSIS — S0181XA Laceration without foreign body of other part of head, initial encounter: Secondary | ICD-10-CM | POA: Diagnosis not present

## 2017-01-11 DIAGNOSIS — J449 Chronic obstructive pulmonary disease, unspecified: Secondary | ICD-10-CM | POA: Diagnosis not present

## 2017-01-11 DIAGNOSIS — S0990XA Unspecified injury of head, initial encounter: Secondary | ICD-10-CM | POA: Diagnosis not present

## 2017-01-11 DIAGNOSIS — Z23 Encounter for immunization: Secondary | ICD-10-CM | POA: Diagnosis not present

## 2017-01-11 DIAGNOSIS — W19XXXA Unspecified fall, initial encounter: Secondary | ICD-10-CM

## 2017-01-11 DIAGNOSIS — W228XXA Striking against or struck by other objects, initial encounter: Secondary | ICD-10-CM | POA: Insufficient documentation

## 2017-01-11 DIAGNOSIS — Z87891 Personal history of nicotine dependence: Secondary | ICD-10-CM | POA: Insufficient documentation

## 2017-01-11 DIAGNOSIS — Y929 Unspecified place or not applicable: Secondary | ICD-10-CM | POA: Diagnosis not present

## 2017-01-11 DIAGNOSIS — R259 Unspecified abnormal involuntary movements: Secondary | ICD-10-CM | POA: Diagnosis not present

## 2017-01-11 DIAGNOSIS — S199XXA Unspecified injury of neck, initial encounter: Secondary | ICD-10-CM | POA: Diagnosis not present

## 2017-01-11 DIAGNOSIS — S098XXA Other specified injuries of head, initial encounter: Secondary | ICD-10-CM | POA: Diagnosis not present

## 2017-01-11 MED ORDER — LIDOCAINE-EPINEPHRINE (PF) 2 %-1:200000 IJ SOLN
20.0000 mL | Freq: Once | INTRAMUSCULAR | Status: AC
  Administered 2017-01-11: 20 mL

## 2017-01-11 MED ORDER — LIDOCAINE-EPINEPHRINE (PF) 2 %-1:200000 IJ SOLN
INTRAMUSCULAR | Status: AC
  Filled 2017-01-11: qty 20

## 2017-01-11 MED ORDER — TETANUS-DIPHTH-ACELL PERTUSSIS 5-2.5-18.5 LF-MCG/0.5 IM SUSP
0.5000 mL | Freq: Once | INTRAMUSCULAR | Status: AC
  Administered 2017-01-11: 0.5 mL via INTRAMUSCULAR
  Filled 2017-01-11: qty 0.5

## 2017-01-11 NOTE — ED Notes (Signed)
Bed: WA07 Expected date:  Expected time:  Means of arrival:  Comments: EMS, fall, head lac

## 2017-01-11 NOTE — ED Provider Notes (Signed)
[facility]-EMERGENCY DEPT Provider Note   CSN: [id number] Arrival date & time: 01/11/17  1836     History   Chief Complaint Chief Complaint  Patient presents with  . Fall  . Laceration    HPI Lori [name] is a 83 y.o. female.  Patient presenting from facility with a history of several falls. Today the patient fell backwards after losing her balance and hit her head on the floor. There was no noted syncope and that episode was witnessed. Patient does complain of some head pain but has no other complaints at this time. Patient does not currently take any anticoagulation. She had no loss of consciousness after hitting her head. She currently does not complain of any pain in her arms or legs.   The history is provided by the patient and the EMS personnel. The history is limited by the condition of the patient.  Fall  This is a recurrent problem.  Laceration      Past Medical History:  Diagnosis Date  . Cataract    bilateral  . Hearing loss   . Memory change   . Migraine   . Mitral stenosis   . Osteoporosis   . Pneumonia   . UTI (urinary tract infection)     Patient Active Problem List   Diagnosis Date Noted  . Adult failure to thrive 12/21/2016  . Atrial fibrillation with RVR (HCC)   . Septic shock (HCC) 10/03/2016  . Sepsis (HCC) 10/03/2016  . Acute kidney injury (HCC) 10/03/2016  . Fall 10/03/2016  . Elevated troponin 10/03/2016  . Lactic acidosis 10/03/2016  . Rhabdomyolysis 10/03/2016  . Dementia 10/03/2016  . Acute cystitis 01/14/2015  . Tremor, essential 06/30/2014  . Inflamed skin tag 09/01/2013  . Memory change   . Hallucinations 01/03/2013  . UTI (urinary tract infection)   . Rhinitis, nonallergic, chronic 11/22/2012  . Pernicious anemia 11/18/2012  . Neuropathy (HCC) 11/18/2012  . Corn of toe 11/18/2012  . Hypertension 06/13/2012  . Chronic obstructive airway disease with asthma (HCC) 10/26/2010  . CERUMEN IMPACTION, BILATERAL 10/05/2008  .  BACK PAIN, ACUTE 01/10/2008  . MIGRAINE HEADACHE 07/23/2007  . LOSS, HEARING NEC 07/23/2007  . STENOSIS, MITRAL 07/23/2007  . OSTEOPOROSIS 07/23/2007  . EDEMA 07/23/2007    Past Surgical History:  Procedure Laterality Date  . ABDOMINAL HYSTERECTOMY    . APPENDECTOMY    . BILATERAL SALPINGOOPHORECTOMY    . EYE SURGERY     bilateral  . TUBAL LIGATION      OB History    No data available       Home Medications    Prior to Admission medications   Medication Sig Start Date End Date Taking? Authorizing Provider  cefUROXime (CEFTIN) 250 MG tablet Take 1 tablet (250 mg total) by mouth 2 (two) times daily with a meal. 10/06/16   Jerald Kief, MD  cephALEXin (KEFLEX) 500 MG capsule Take 1 capsule (500 mg total) by mouth 2 (two) times daily. 12/19/16   Laurence Spates, MD  cyanocobalamin (,VITAMIN B-12,) 1000 MCG/ML injection INJECT EVERY 30 DAYS 10/11/16   Roderick Pee, MD  donepezil (ARICEPT) 10 MG tablet Take 1 tablet (10 mg total) by mouth daily. 07/31/16   Levert Feinstein, MD  guaiFENesin (MUCINEX) 600 MG 12 hr tablet Take 600 mg by mouth 2 (two) times daily.    Historical Provider, MD  loperamide (IMODIUM) 2 MG capsule Take 1 capsule (2 mg total) by mouth 4 (four) times daily  as needed for diarrhea or loose stools. Patient taking differently: Take 2 mg by mouth every 6 (six) hours as needed for diarrhea or loose stools.  09/27/16   Mancel BaleElliott Wentz, MD  memantine (NAMENDA) 5 MG tablet Take 5-10 mg by mouth 2 (two) times daily. Takes 5 mg in the morning and 10 mg at bedtime    Historical Provider, MD    Family History Family History  Problem Relation Age of Onset  . Heart Problems Mother   . Heart Problems Father     Social History Social History  Substance Use Topics  . Smoking status: Former Smoker    Packs/day: 0.50    Years: 24.00    Types: Cigarettes    Quit date: 11/13/1965  . Smokeless tobacco: Never Used  . Alcohol use 0.6 oz/week    1 Glasses of wine per week       Comment: occ     Allergies   Patient has no known allergies.   Review of Systems Review of Systems  Unable to perform ROS: Dementia     Physical Exam Updated Vital Signs BP (!) 136/52 (BP Location: Left Arm)   Pulse 71   Temp 97.9 F (36.6 C) (Oral)   Resp 16   Ht 5\' 2"  (1.575 m)   Wt 102 lb (46.3 kg)   SpO2 98%   BMI 18.66 kg/m   Physical Exam  Constitutional: She appears well-developed and well-nourished. No distress.  HENT:  Head: Normocephalic. Head is with laceration.    Mouth/Throat: Oropharynx is clear and moist.  Eyes: Conjunctivae and EOM are normal. Pupils are equal, round, and reactive to light.  Neck: Normal range of motion. Neck supple.  Cardiovascular: Normal rate and intact distal pulses.  An irregularly irregular rhythm present.  No murmur heard. Pulmonary/Chest: Effort normal and breath sounds normal. No respiratory distress. She has no wheezes. She has no rales.  Abdominal: Soft. She exhibits no distension. There is no tenderness. There is no rebound and no guarding.  Musculoskeletal: Normal range of motion. She exhibits no edema or tenderness.  Able To range bilateral hips , elbows and shoulders without pain  Neurological: She is alert.  Skin: Skin is warm and dry. No rash noted. No erythema.  Psychiatric:  Calm and cooperative  Nursing note and vitals reviewed.    ED Treatments / Results  Labs (all labs ordered are listed, but only abnormal results are displayed) Labs Reviewed - No data to display  EKG  EKG Interpretation None       Radiology Ct Head Wo Contrast  Result Date: 01/11/2017 CLINICAL DATA:  Fall backwards today with posterior laceration. History of frequent falls. EXAM: CT HEAD WITHOUT CONTRAST CT CERVICAL SPINE WITHOUT CONTRAST TECHNIQUE: Multidetector CT imaging of the head and cervical spine was performed following the standard protocol without intravenous contrast. Multiplanar CT image reconstructions of the  cervical spine were also generated. COMPARISON:  Head and cervical spine CT 10/03/2016 FINDINGS: CT HEAD FINDINGS Brain: No evidence of acute infarction, hemorrhage, hydrocephalus, extra-axial collection or mass lesion/mass effect. Stable atrophy and chronic small vessel ischemia. Vascular: Atherosclerosis of skullbase vasculature without hyperdense vessel or abnormal calcification. Skull: No skull fracture.  Posterior parietal scalp hematoma. Sinuses/Orbits: Paranasal sinuses and mastoid air cells are clear. Bilateral cataract resection. Other: None. CT CERVICAL SPINE FINDINGS Alignment: Stable. Trace anterolisthesis of C6 on C7 appears degenerative and unchanged from prior. No jumped or perched facets. Skull base and vertebrae: No acute fracture. No primary  bone lesion or focal pathologic process. Soft tissues and spinal canal: No prevertebral fluid or swelling. No visible canal hematoma. Disc levels: Mild multilevel disc space narrowing and endplate spurring, stable. Unstable facet arthropathy. Upper chest: Stable biapical pleuroparenchymal scarring. Other: Carotid vascular calcifications. IMPRESSION: 1. Posterior scalp hematoma without fracture or acute intracranial abnormality. Stable atrophy and chronic small vessel ischemia. 2. Stable degenerative change in the cervical spine without acute fracture or subluxation. Electronically Signed   By: Rubye Oaks M.D.   On: 01/11/2017 20:23   Ct Cervical Spine Wo Contrast  Result Date: 01/11/2017 CLINICAL DATA:  Fall backwards today with posterior laceration. History of frequent falls. EXAM: CT HEAD WITHOUT CONTRAST CT CERVICAL SPINE WITHOUT CONTRAST TECHNIQUE: Multidetector CT imaging of the head and cervical spine was performed following the standard protocol without intravenous contrast. Multiplanar CT image reconstructions of the cervical spine were also generated. COMPARISON:  Head and cervical spine CT 10/03/2016 FINDINGS: CT HEAD FINDINGS Brain: No  evidence of acute infarction, hemorrhage, hydrocephalus, extra-axial collection or mass lesion/mass effect. Stable atrophy and chronic small vessel ischemia. Vascular: Atherosclerosis of skullbase vasculature without hyperdense vessel or abnormal calcification. Skull: No skull fracture.  Posterior parietal scalp hematoma. Sinuses/Orbits: Paranasal sinuses and mastoid air cells are clear. Bilateral cataract resection. Other: None. CT CERVICAL SPINE FINDINGS Alignment: Stable. Trace anterolisthesis of C6 on C7 appears degenerative and unchanged from prior. No jumped or perched facets. Skull base and vertebrae: No acute fracture. No primary bone lesion or focal pathologic process. Soft tissues and spinal canal: No prevertebral fluid or swelling. No visible canal hematoma. Disc levels: Mild multilevel disc space narrowing and endplate spurring, stable. Unstable facet arthropathy. Upper chest: Stable biapical pleuroparenchymal scarring. Other: Carotid vascular calcifications. IMPRESSION: 1. Posterior scalp hematoma without fracture or acute intracranial abnormality. Stable atrophy and chronic small vessel ischemia. 2. Stable degenerative change in the cervical spine without acute fracture or subluxation. Electronically Signed   By: Rubye Oaks M.D.   On: 01/11/2017 20:23    Procedures Procedures (including critical care time)  Medications Ordered in ED Medications  lidocaine-EPINEPHrine (XYLOCAINE W/EPI) 2 %-1:200000 (PF) injection 20 mL (not administered)   LACERATION REPAIR Performed by: Gwyneth Sprout Authorized byGwyneth Sprout Consent: Verbal consent obtained. Risks and benefits: risks, benefits and alternatives were discussed Consent given by: patient Patient identity confirmed: provided demographic data Prepped and Draped in normal sterile fashion Wound explored  Laceration Location: occipital scalp  Laceration Length: 2cm  No Foreign Bodies seen or palpated  Anesthesia: local  infiltration  Local anesthetic: lidocaine 1% with epinephrine  Anesthetic total: 4 ml  Irrigation method: syringe Amount of cleaning: standard  Skin closure: staples  Number of sutures:2   Patient tolerance: Patient tolerated the procedure well with no immediate complications.   Initial Impression / Assessment and Plan / ED Course  I have reviewed the triage vital signs and the nursing notes.  Pertinent labs & imaging results that were available during my care of the patient were reviewed by me and considered in my medical decision making (see chart for details).     Patient with a history of falls who fell backwards striking her head on the ground. She is not anticoagulated and had no LOC. CT of the head and neck pending. Wound repaired as above. Tetanus Shot updated.  9:29 PM Imaging is neg and wound repaired as above.  Final Clinical Impressions(s) / ED Diagnoses   Final diagnoses:  Fall, initial encounter  Scalp laceration, initial encounter  New Prescriptions New Prescriptions   No medications on file     Gwyneth Sprout, MD 01/11/17 2130

## 2017-01-11 NOTE — ED Triage Notes (Signed)
Per EMS, pt has a history of several falls. Pt fell backwards today after losing her balance and sustained a laceration to the back of the head.  There was no syncopal episode or LOC before or after fall.  Pt c/o a bit of pain.  [facility].  Pt is not on blood thinners. Hx of dementia and a. Fib.

## 2017-01-15 ENCOUNTER — Encounter: Payer: Self-pay | Admitting: Family Medicine

## 2017-02-12 ENCOUNTER — Encounter: Payer: Self-pay | Admitting: Nurse Practitioner

## 2017-02-12 ENCOUNTER — Non-Acute Institutional Stay (SKILLED_NURSING_FACILITY): Payer: Medicare Other | Admitting: Nurse Practitioner

## 2017-02-12 DIAGNOSIS — F0280 Dementia in other diseases classified elsewhere without behavioral disturbance: Secondary | ICD-10-CM

## 2017-02-12 DIAGNOSIS — D649 Anemia, unspecified: Secondary | ICD-10-CM | POA: Diagnosis not present

## 2017-02-12 DIAGNOSIS — G301 Alzheimer's disease with late onset: Secondary | ICD-10-CM | POA: Diagnosis not present

## 2017-02-12 DIAGNOSIS — R627 Adult failure to thrive: Secondary | ICD-10-CM | POA: Diagnosis not present

## 2017-02-12 NOTE — Progress Notes (Signed)
Nursing Home Location:  [facility] Room Number: 101 B  Place of Service: SNF (31)  PCP: Marga Melnick, MD  No Known Allergies  Chief Complaint  Patient presents with  . Medical Management of Chronic Issues    Resident is being seen for a routine visit.     HPI:  Patient is a 83 y.o. female seen today at [facility] for routine follow up. Pt is now long term at [facility]. Pt with hx of dementia, OP. She has been at [facility] after hospitalization for UTI. Pt was previously living at [facility] but due to the progressive dementia she will [facility] long term.  Staff without concerns at this time. Pt unable to contribute to ROS /HPI due to dementia.    Review of Systems:  Review of Systems  Unable to perform ROS: Dementia    Past Medical History:  Diagnosis Date  . Cataract    bilateral  . Hearing loss   . Memory change   . Migraine   . Mitral stenosis   . Osteoporosis   . Pneumonia   . UTI (urinary tract infection)    Past Surgical History:  Procedure Laterality Date  . ABDOMINAL HYSTERECTOMY    . APPENDECTOMY    . BILATERAL SALPINGOOPHORECTOMY    . EYE SURGERY     bilateral  . TUBAL LIGATION     Social History:   reports that she quit smoking about 51 years ago. Her smoking use included Cigarettes. She has a 12.00 pack-year smoking history. She has never used smokeless tobacco. She reports that she drinks about 0.6 oz of alcohol per week . She reports that she does not use drugs.  Family History  Problem Relation Age of Onset  . Heart Problems Mother   . Heart Problems Father     Medications: Patient's Medications  New Prescriptions   No medications on file  Previous Medications   ACETAMINOPHEN (TYLENOL) 325 MG TABLET    Take 650 mg by mouth every 6 (six) hours as needed.   CYANOCOBALAMIN (,VITAMIN B-12,) 1000 MCG/ML INJECTION    INJECT EVERY 30 DAYS   DONEPEZIL (ARICEPT) 10 MG TABLET    Take 1 tablet  (10 mg total) by mouth daily.   GUAIFENESIN (MUCINEX) 600 MG 12 HR TABLET    Take 600 mg by mouth 2 (two) times daily as needed for to loosen phlegm.    LOPERAMIDE (IMODIUM) 2 MG CAPSULE    Take 1 capsule (2 mg total) by mouth 4 (four) times daily as needed for diarrhea or loose stools.   MEMANTINE (NAMENDA) 5 MG TABLET    Take 5-10 mg by mouth 2 (two) times daily. Takes 5 mg in the morning and 10 mg at bedtime  Modified Medications   No medications on file  Discontinued Medications   CEFUROXIME (CEFTIN) 250 MG TABLET    Take 1 tablet (250 mg total) by mouth 2 (two) times daily with a meal.   CEPHALEXIN (KEFLEX) 500 MG CAPSULE    Take 1 capsule (500 mg total) by mouth 2 (two) times daily.     Physical Exam: Vitals:   02/12/17 1043  BP: 108/81  Pulse: 72  Resp: 20  Temp: 98.4 F (36.9 C)  SpO2: 96%  Weight: 101 lb 9.6 oz (46.1 kg)  Height:  (1.575 m)    Physical Exam  Constitutional: No distress.  Fail female.   HENT:  Head: Normocephalic and atraumatic.  Mouth/Throat: Oropharynx is clear and moist. No oropharyngeal exudate.  Eyes: Conjunctivae are normal. Pupils are equal, round, and reactive to light.  Neck: Normal range of motion. Neck supple.  Cardiovascular: Normal rate, regular rhythm and normal heart sounds.   Pulmonary/Chest: Effort normal and breath sounds normal.  Abdominal: Soft. Bowel sounds are normal.  Musculoskeletal: She exhibits no edema or tenderness.  Neurological: She is alert.  Skin: Skin is warm and dry. She is not diaphoretic.  Psychiatric: She has a normal mood and affect.    Labs reviewed: Basic Metabolic Panel:  Recent Labs  40/98/11 0308 10/05/16 0359 10/06/16 0334  NA 140 143 141  K 3.3* 3.3* 3.9  CL 117* 118* 115*  CO2 18* 20* 21*  GLUCOSE 92 91 96  BUN 40* 25* 13  CREATININE 0.95 0.71 0.71  CALCIUM 8.1* 8.1* 8.3*   Liver Function Tests:  Recent Labs  10/03/16 1101  AST 20  ALT 14  ALKPHOS 61  BILITOT 1.0  PROT 7.3    ALBUMIN 4.2   No results for input(s): LIPASE, AMYLASE in the last 8760 hours. No results for input(s): AMMONIA in the last 8760 hours. CBC:  Recent Labs  09/27/16 1651  10/03/16 1101 10/04/16 0308 10/05/16 0359  WBC 4.3  --  9.0 5.7 5.3  NEUTROABS 2.6  --  7.2  --   --   HGB 12.9  < > 14.4 10.3* 10.1*  HCT 38.5  < > 41.3 30.1* 30.0*  MCV 95.1  --  90.6 93.2 90.1  PLT 293  --  276 215 203  < > = values in this interval not displayed. TSH: No results for input(s): TSH in the last 8760 hours. A1C: No results found for: HGBA1C Lipid Panel: No results for input(s): CHOL, HDL, LDLCALC, TRIG, CHOLHDL, LDLDIRECT in the last 8760 hours.  Assessment/Plan 1. Late onset Alzheimer's disease without behavioral disturbance Progressive dementia, pt wihtout acute decline noted. conts on aricept and namenda. Will follow up bmp at this time  2. Adult failure to thrive Weight has been stable in the last month. Staff assisting with personal care.   3. Anemia, unspecified type Will follow up CBC.     Janene Harvey. Biagio Borg  [facility] & Adult Medicine [phone] 8 am - 5 pm) [phone] (after hours)

## 2017-02-13 DIAGNOSIS — I482 Chronic atrial fibrillation: Secondary | ICD-10-CM | POA: Diagnosis not present

## 2017-02-13 DIAGNOSIS — M6281 Muscle weakness (generalized): Secondary | ICD-10-CM | POA: Diagnosis not present

## 2017-02-13 DIAGNOSIS — I10 Essential (primary) hypertension: Secondary | ICD-10-CM | POA: Diagnosis not present

## 2017-02-13 DIAGNOSIS — D649 Anemia, unspecified: Secondary | ICD-10-CM | POA: Diagnosis not present

## 2017-02-13 DIAGNOSIS — G629 Polyneuropathy, unspecified: Secondary | ICD-10-CM | POA: Diagnosis not present

## 2017-02-13 LAB — BASIC METABOLIC PANEL
BUN: 23 mg/dL — AB (ref 4–21)
Creatinine: 0.4 mg/dL — AB (ref 0.5–1.1)
Glucose: 89 mg/dL
Potassium: 4 mmol/L (ref 3.4–5.3)
Sodium: 141 mmol/L (ref 137–147)

## 2017-02-13 LAB — HEPATIC FUNCTION PANEL
ALT: 16 U/L (ref 7–35)
AST: 17 U/L (ref 13–35)
Alkaline Phosphatase: 65 U/L (ref 25–125)
BILIRUBIN, TOTAL: 0.4 mg/dL

## 2017-02-13 LAB — CBC AND DIFFERENTIAL
HEMATOCRIT: 36 % (ref 36–46)
HEMOGLOBIN: 11.9 g/dL — AB (ref 12.0–16.0)
PLATELETS: 181 10*3/uL (ref 150–399)
WBC: 4.6 10^3/mL

## 2017-03-21 ENCOUNTER — Non-Acute Institutional Stay (SKILLED_NURSING_FACILITY): Payer: Medicare Other | Admitting: Nurse Practitioner

## 2017-03-21 ENCOUNTER — Encounter: Payer: Self-pay | Admitting: Nurse Practitioner

## 2017-03-21 DIAGNOSIS — D510 Vitamin B12 deficiency anemia due to intrinsic factor deficiency: Secondary | ICD-10-CM

## 2017-03-21 DIAGNOSIS — R627 Adult failure to thrive: Secondary | ICD-10-CM | POA: Diagnosis not present

## 2017-03-21 DIAGNOSIS — G301 Alzheimer's disease with late onset: Secondary | ICD-10-CM | POA: Diagnosis not present

## 2017-03-21 DIAGNOSIS — F0280 Dementia in other diseases classified elsewhere without behavioral disturbance: Secondary | ICD-10-CM | POA: Diagnosis not present

## 2017-03-21 DIAGNOSIS — I10 Essential (primary) hypertension: Secondary | ICD-10-CM | POA: Diagnosis not present

## 2017-03-21 NOTE — Progress Notes (Signed)
Nursing Home Location:  [facility] Room Number: 101 B  Place of Service: SNF (31)  PCP: Pecola LawlessHopper, William F, MD  No Known Allergies  Chief Complaint  Patient presents with  . Medical Management of Chronic Issues    Resident is being seen for a routine visit.     HPI:  Patient is a 83 y.o. female [facility]. Pt is now long term at [facility]. Pt with hx of dementia, OP. Pt has been doing well in the last month. There has been no acute issues. Pt uses walker and ambulates around facility.   Review of Systems:  Review of Systems  Unable to perform ROS: Dementia    Past Medical History:  Diagnosis Date  . Cataract    bilateral  . Hearing loss   . Memory change   . Migraine   . Mitral stenosis   . Osteoporosis   . Pneumonia   . UTI (urinary tract infection)    Past Surgical History:  Procedure Laterality Date  . ABDOMINAL HYSTERECTOMY    . APPENDECTOMY    . BILATERAL SALPINGOOPHORECTOMY    . EYE SURGERY     bilateral  . TUBAL LIGATION     Social History:   reports that she quit smoking about 51 years ago. Her smoking use included Cigarettes. She has a 12.00 pack-year smoking history. She has never used smokeless tobacco. She reports that she drinks about 0.6 oz of alcohol per week . She reports that she does not use drugs.  Family History  Problem Relation Age of Onset  . Heart Problems Mother   . Heart Problems Father     Medications: Patient's Medications  New Prescriptions   No medications on file  Previous Medications   ACETAMINOPHEN (TYLENOL) 325 MG TABLET    Take 650 mg by mouth every 6 (six) hours as needed.   AMBULATORY NON FORMULARY MEDICATION    Give 120 ml of MedPass twice daily.   CYANOCOBALAMIN (,VITAMIN B-12,) 1000 MCG/ML INJECTION    INJECT 1ML EVERY 30 DAYS   DONEPEZIL (ARICEPT) 10 MG TABLET    Take 1 tablet (10 mg total) by mouth daily.   GUAIFENESIN (MUCINEX) 600 MG 12 HR TABLET     Take 600 mg by mouth 2 (two) times daily as needed for to loosen phlegm.    LOPERAMIDE (IMODIUM) 2 MG CAPSULE    Take 1 capsule (2 mg total) by mouth 4 (four) times daily as needed for diarrhea or loose stools.   MEMANTINE (NAMENDA) 5 MG TABLET    Take 5-10 mg by mouth 2 (two) times daily. Takes 5 mg in the morning and 10 mg at bedtime  Modified Medications   No medications on file  Discontinued Medications   No medications on file     Physical Exam: Vitals:   03/21/17 1024  BP: 136/72  Pulse: 82  Resp: 20  Temp: 98.5 F (36.9 C)  SpO2: 97%  Weight: 103 lb 6.4 oz (46.9 kg)  Height: 5\' 2"  (1.575 m)    Physical Exam  Constitutional: No distress.  Fail female.   HENT:  Head: Normocephalic and atraumatic.  Mouth/Throat: Oropharynx is clear and moist. No oropharyngeal exudate.  Eyes: Conjunctivae are normal. Pupils are equal, round, and reactive to light.  Neck: Normal range of motion. Neck supple.  Cardiovascular: Normal rate, regular rhythm and normal heart sounds.   Pulmonary/Chest: Effort normal and breath sounds normal.  Abdominal: Soft. Bowel sounds are normal.  Musculoskeletal: She exhibits no edema or tenderness.  Neurological: She is alert.  Skin: Skin is warm and dry. She is not diaphoretic.  Psychiatric: She has a normal mood and affect.    Labs reviewed: Basic Metabolic Panel:  Recent Labs  16/10/96 0308 10/05/16 0359 10/06/16 0334 02/13/17  NA 140 143 141 141  K 3.3* 3.3* 3.9 4.0  CL 117* 118* 115*  --   CO2 18* 20* 21*  --   GLUCOSE 92 91 96  --   BUN 40* 25* 13 23*  CREATININE 0.95 0.71 0.71 0.4*  CALCIUM 8.1* 8.1* 8.3*  --    Liver Function Tests:  Recent Labs  10/03/16 1101 02/13/17  AST 20 17  ALT 14 16  ALKPHOS 61 65  BILITOT 1.0  --   PROT 7.3  --   ALBUMIN 4.2  --    No results for input(s): LIPASE, AMYLASE in the last 8760 hours. No results for input(s): AMMONIA in the last 8760 hours. CBC:  Recent Labs  09/27/16 1651   10/03/16 1101 10/04/16 0308 10/05/16 0359 02/13/17  WBC 4.3  --  9.0 5.7 5.3 4.6  NEUTROABS 2.6  --  7.2  --   --   --   HGB 12.9  < > 14.4 10.3* 10.1* 11.9*  HCT 38.5  < > 41.3 30.1* 30.0* 36  MCV 95.1  --  90.6 93.2 90.1  --   PLT 293  --  276 215 203 181  < > = values in this interval not displayed. TSH: No results for input(s): TSH in the last 8760 hours. A1C: No results found for: HGBA1C Lipid Panel: No results for input(s): CHOL, HDL, LDLCALC, TRIG, CHOLHDL, LDLDIRECT in the last 8760 hours.  Assessment/Plan 1. Adult failure to thrive Weight has been stable in the last month, conts on supplements.   2. Pernicious anemia Stable, conts on B12 supplements.   3. Essential hypertension Blood pressure stable, does not require medication   4. Dementia Stable at this time, advanced memory loss but independent of ADLs with some assistance, will increase namenda to 10 mg BID and to cont aricept 10 mg daily    Lachrista Heslin K. Biagio Borg  [facility] & Adult Medicine [phone] 8 am - 5 pm) [phone] (after hours)

## 2017-03-23 DIAGNOSIS — H903 Sensorineural hearing loss, bilateral: Secondary | ICD-10-CM | POA: Diagnosis not present

## 2017-04-04 DIAGNOSIS — N390 Urinary tract infection, site not specified: Secondary | ICD-10-CM | POA: Diagnosis not present

## 2017-04-04 DIAGNOSIS — R319 Hematuria, unspecified: Secondary | ICD-10-CM | POA: Diagnosis not present

## 2017-04-18 ENCOUNTER — Encounter: Payer: Self-pay | Admitting: Adult Health

## 2017-04-18 NOTE — Progress Notes (Signed)
DATE:  04/18/2017 MRN:  [id number]  BIRTHDAY: 1920-03-08  Facility:  Nursing Home Location:  [facility]  Nursing Home Room Number: 101-B  LEVEL OF CARE:  SNF (31)  Contact Information    Name Relation Home Work Mobile   Perdue,Katheleen Daughter [phone]     [facility] Daughter [phone]     Donah Driver   [phone]       Code Status History    Date Active Date Inactive Code Status Order ID Comments User Context   10/04/2016  7:26 PM 10/07/2016  1:54 PM DNR [id number]  Cathren Harsh, MD Inpatient   10/04/2016 12:16 AM 10/04/2016  7:26 PM DNR [id number]  Schorr, Roma Kayser, NP Inpatient   10/03/2016  1:57 PM 10/03/2016  3:35 PM Full Code [id number]  Cathren Harsh, MD Inpatient    Questions for Most Recent Historical Code Status (Order [id number])    Question Answer Comment   In the event of cardiac or respiratory ARREST Do not call a "code blue"    In the event of cardiac or respiratory ARREST Do not perform Intubation, CPR, defibrillation or ACLS    In the event of cardiac or respiratory ARREST Use medication by any route, position, wound care, and other measures to relive pain and suffering. May use oxygen, suction and manual treatment of airway obstruction as needed for comfort.         Advance Directive Documentation     Most Recent Value  Type of Advance Directive  Out of facility DNR (pink MOST or yellow form)  Pre-existing out of facility DNR order (yellow form or pink MOST form)  -  "MOST" Form in Place?  -       Chief Complaint  Patient presents with  . Medical Management of Chronic Issues    Routine visit    HISTORY OF PRESENT ILLNESS:  This is a 60-YO female seen for a routine visit.  She is a long-term care resident at [facility].      PAST MEDICAL HISTORY:  Past Medical History:  Diagnosis Date  . Cataract    bilateral  . Hearing loss   . Memory change   . Migraine   . Mitral stenosis   .  Osteoporosis   . Pneumonia   . UTI (urinary tract infection)      CURRENT MEDICATIONS: Reviewed  Patient's Medications  New Prescriptions   No medications on file  Previous Medications   ACETAMINOPHEN (TYLENOL) 325 MG TABLET    Take 650 mg by mouth every 6 (six) hours as needed.   AMBULATORY NON FORMULARY MEDICATION    Give 120 ml of MedPass twice daily.   CYANOCOBALAMIN (,VITAMIN B-12,) 1000 MCG/ML INJECTION    INJECT EVERY 30 DAYS   DONEPEZIL (ARICEPT) 10 MG TABLET    Take 1 tablet (10 mg total) by mouth daily.   GUAIFENESIN (MUCINEX) 600 MG 12 HR TABLET    Take 600 mg by mouth 2 (two) times daily as needed for to loosen phlegm.    LOPERAMIDE (IMODIUM) 2 MG CAPSULE    Take 1 capsule (2 mg total) by mouth 4 (four) times daily as needed for diarrhea or loose stools.   MEMANTINE (NAMENDA) 10 MG TABLET    Take 10 mg by mouth 2 (two) times daily.  Modified Medications   No medications on file  Discontinued Medications   MEMANTINE (NAMENDA) 5 MG TABLET    Take 5-10 mg by mouth 2 (  two) times daily. Takes 5 mg in the morning and 10 mg at bedtime     No Known Allergies   REVIEW OF SYSTEMS:  GENERAL: no change in appetite, no fatigue, no weight changes, no fever, chills or weakness SKIN: Denies rash, itching, wounds, ulcer sores, or nail abnormality EYES: Denies change in vision, dry eyes, eye pain, itching or discharge EARS: Denies change in hearing, ringing in ears, or earache NOSE: Denies nasal congestion or epistaxis MOUTH and THROAT: Denies oral discomfort, gingival pain or bleeding, pain from teeth or hoarseness   RESPIRATORY: no cough, SOB, DOE, wheezing, hemoptysis CARDIAC: no chest pain, edema or palpitations GI: no abdominal pain, diarrhea, constipation, heart burn, nausea or vomiting GU: Denies dysuria, frequency, hematuria, incontinence, or discharge MUSCULOSKELETAL: Denies joit pain, muscle pain, back pain, restricted movement, or unusual weakness CIRCULATION: Denies  claudication, edema of legs, varicosities, or cold extremities NEUROLOGICAL: Denies dizziness, syncope, numbness, or headache PSYCHIATRIC: Denies feeling of depression or anxiety. No report of hallucinations, insomnia, paranoia, or agitation ENDOCRINE: Denies polyphagia, polyuria, polydipsia, heat or cold intolerance HEME/LYMPH: Denies excessive bruising, petechia, enlarged lymph nodes, or bleeding problems IMMUNOLOGIC: Denies history of frequent infections, AIDS, or use of immunosuppressive agents    PHYSICAL EXAMINATION  GENERAL APPEARANCE: Well nourished. In no acute distress. Normal body habitus SKIN:  Skin is warm and dry. There are no suspicious lesions or rash HEAD: Normal in size and contour. No evidence of trauma EYES: Lids open and close normally. No blepharitis, entropion or ectropion. PERRL. Conjunctivae are clear and sclerae are white. Lenses are without opacity EARS: Pinnae are normal. Patient hears normal voice tunes of the examiner MOUTH and THROAT: Lips are without lesions. Oral mucosa is moist and without lesions. Tongue is normal in shape, size, and color and without lesions NECK: supple, trachea midline, no neck masses, no thyroid tenderness, no thyromegaly LYMPHATICS: no LAN in the neck, no supraclavicular LAN RESPIRATORY: breathing is even & unlabored, BS CTAB CARDIAC: RRR, no murmur,no extra heart sounds, no edema GI: abdomen soft, normal BS, no masses, no tenderness, no hepatomegaly, no splenomegaly MUSCULOSKELETAL: No deformities. Movement at each extremity is full and painless. Strength is 5/5 at each extremity. Back is without kyphosis or scoliosis CIRCULATION: pedal pulses are 2+. There is no edema of the legs, ankles and feet NEUROLOGICAL: There is no tremor. Speech is clear PSYCHIATRIC: Alert and oriented X 3. Affect and behavior are appropriate  LABS/RADIOLOGY: Labs reviewed: Basic Metabolic Panel:  Recent Labs  65/78/4611/22/17 0308 10/05/16 0359  10/06/16 0334 02/13/17  NA 140 143 141 141  K 3.3* 3.3* 3.9 4.0  CL 117* 118* 115*  --   CO2 18* 20* 21*  --   GLUCOSE 92 91 96  --   BUN 40* 25* 13 23*  CREATININE 0.95 0.71 0.71 0.4*  CALCIUM 8.1* 8.1* 8.3*  --    Liver Function Tests:  Recent Labs  10/03/16 1101 02/13/17  AST 20 17  ALT 14 16  ALKPHOS 61 65  BILITOT 1.0  --   PROT 7.3  --   ALBUMIN 4.2  --    CBC:  Recent Labs  09/27/16 1651  10/03/16 1101 10/04/16 0308 10/05/16 0359 02/13/17  WBC 4.3  --  9.0 5.7 5.3 4.6  NEUTROABS 2.6  --  7.2  --   --   --   HGB 12.9  < > 14.4 10.3* 10.1* 11.9*  HCT 38.5  < > 41.3 30.1* 30.0* 36  MCV 95.1  --  90.6 93.2 90.1  --   PLT 293  --  276 215 203 181  < > = values in this interval not displayed. Cardiac Enzymes:  Recent Labs  10/03/16 1101 10/03/16 1546 10/03/16 2115 10/04/16 0308 10/04/16 0850 10/05/16 0359  CKTOTAL 306*  --   --   --  209 111  TROPONINI  --  0.18* 0.19* 0.18*  --   --     ASSESSMENT/PLAN:       Monina C. Medina-Vargas - NP    [facility] [phone]   This encounter was created in error - please disregard.

## 2017-04-20 ENCOUNTER — Non-Acute Institutional Stay (SKILLED_NURSING_FACILITY): Payer: Medicare Other | Admitting: Nurse Practitioner

## 2017-04-20 ENCOUNTER — Encounter: Payer: Self-pay | Admitting: Nurse Practitioner

## 2017-04-20 DIAGNOSIS — R339 Retention of urine, unspecified: Secondary | ICD-10-CM

## 2017-04-20 DIAGNOSIS — D510 Vitamin B12 deficiency anemia due to intrinsic factor deficiency: Secondary | ICD-10-CM

## 2017-04-20 DIAGNOSIS — G301 Alzheimer's disease with late onset: Secondary | ICD-10-CM

## 2017-04-20 DIAGNOSIS — F0280 Dementia in other diseases classified elsewhere without behavioral disturbance: Secondary | ICD-10-CM | POA: Diagnosis not present

## 2017-04-20 DIAGNOSIS — N390 Urinary tract infection, site not specified: Secondary | ICD-10-CM

## 2017-04-20 NOTE — Progress Notes (Signed)
Nursing Home Location:  [facility] Room Number: 101 B  Place of Service: SNF (31)  PCP: Pecola LawlessHopper, William F, MD  No Known Allergies  Chief Complaint  Patient presents with  . Medical Management of Chronic Issues    Resident is being seen for a routine visit and a follow up on UTI.     HPI:  Patient is a 83 y.o. female [facility]. [facility]. Pt with hx of dementia, OP. Pt has had a hx of urinary retention and at one time had a foley catheter. Per nursing pt kept pulling the catheter out and she was able to void therefore it was left out. Pt was recently treated for UTI with macrobid however family reported to nursing that she is still complaining of dysuria and acting more confused. Pt with advanced dementia and unable to provide history. Reports today she is not having painful urination. There has been no fevers noted per staff.   Review of Systems:  Review of Systems  Unable to perform ROS: Dementia    Past Medical History:  Diagnosis Date  . Cataract    bilateral  . Hearing loss   . Memory change   . Migraine   . Mitral stenosis   . Osteoporosis   . Pneumonia   . UTI (urinary tract infection)    Past Surgical History:  Procedure Laterality Date  . ABDOMINAL HYSTERECTOMY    . APPENDECTOMY    . BILATERAL SALPINGOOPHORECTOMY    . EYE SURGERY     bilateral  . TUBAL LIGATION     Social History:   reports that she quit smoking about 51 years ago. Her smoking use included Cigarettes. She has a 12.00 pack-year smoking history. She has never used smokeless tobacco. She reports that she drinks about 0.6 oz of alcohol per week . She reports that she does not use drugs.  Family History  Problem Relation Age of Onset  . Heart Problems Mother   . Heart Problems Father     Medications: Patient's Medications  New Prescriptions   No medications on file  Previous Medications   AMBULATORY  NON FORMULARY MEDICATION    Give 120 ml of MedPass twice daily.   CYANOCOBALAMIN (,VITAMIN B-12,) 1000 MCG/ML INJECTION    INJECT 1ML EVERY 30 DAYS   GUAIFENESIN (MUCINEX) 600 MG 12 HR TABLET    Take 600 mg by mouth 2 (two) times daily as needed for to loosen phlegm.    LOPERAMIDE (IMODIUM) 2 MG CAPSULE    Take 1 capsule (2 mg total) by mouth 4 (four) times daily as needed for diarrhea or loose stools.   MEMANTINE (NAMENDA) 10 MG TABLET    Take 10 mg by mouth 2 (two) times daily.  Modified Medications   No medications on file  Discontinued Medications   ACETAMINOPHEN (TYLENOL) 325 MG TABLET    Take 650 mg by mouth every 6 (six) hours as needed.   DONEPEZIL (ARICEPT) 10 MG TABLET    Take 1 tablet (10 mg total) by mouth daily.     Physical Exam: Vitals:   04/20/17 1329  BP: 96/62  Pulse: 98  Resp: 18  Temp: 98.4 F (36.9 C)  Weight: 102 lb (46.3 kg)  Height: 5\' 2"  (1.575 m)    Physical Exam  Constitutional: No distress.  Fail female.   HENT:  Head: Normocephalic and atraumatic.  Mouth/Throat: Oropharynx is  clear and moist. No oropharyngeal exudate.  Eyes: Conjunctivae are normal. Pupils are equal, round, and reactive to light.  Neck: Normal range of motion. Neck supple.  Cardiovascular: Normal rate, regular rhythm and normal heart sounds.   Pulmonary/Chest: Effort normal and breath sounds normal.  Abdominal: Soft. Bowel sounds are normal. There is tenderness (suprapubic).  Musculoskeletal: She exhibits no edema or tenderness.  Neurological: She is alert.  Skin: Skin is warm and dry. She is not diaphoretic.  Psychiatric: She has a normal mood and affect.    Labs reviewed: Basic Metabolic Panel:  Recent Labs  40/98/11 0308 10/05/16 0359 10/06/16 0334 02/13/17  NA 140 143 141 141  K 3.3* 3.3* 3.9 4.0  CL 117* 118* 115*  --   CO2 18* 20* 21*  --   GLUCOSE 92 91 96  --   BUN 40* 25* 13 23*  CREATININE 0.95 0.71 0.71 0.4*  CALCIUM 8.1* 8.1* 8.3*  --    Liver  Function Tests:  Recent Labs  10/03/16 1101 02/13/17  AST 20 17  ALT 14 16  ALKPHOS 61 65  BILITOT 1.0  --   PROT 7.3  --   ALBUMIN 4.2  --    No results for input(s): LIPASE, AMYLASE in the last 8760 hours. No results for input(s): AMMONIA in the last 8760 hours. CBC:  Recent Labs  09/27/16 1651  10/03/16 1101 10/04/16 0308 10/05/16 0359 02/13/17  WBC 4.3  --  9.0 5.7 5.3 4.6  NEUTROABS 2.6  --  7.2  --   --   --   HGB 12.9  < > 14.4 10.3* 10.1* 11.9*  HCT 38.5  < > 41.3 30.1* 30.0* 36  MCV 95.1  --  90.6 93.2 90.1  --   PLT 293  --  276 215 203 181  < > = values in this interval not displayed. TSH: No results for input(s): TSH in the last 8760 hours. A1C: No results found for: HGBA1C Lipid Panel: No results for input(s): CHOL, HDL, LDLCALC, TRIG, CHOLHDL, LDLDIRECT in the last 8760 hours.  Assessment/Plan 1. Urinary retention Pt has been voiding at facility without difficulty per staff however due to hx and family concerns will have staff make a follow up urology visit -staff also to do post void bladder scan to check residual urine  2. Urinary tract infection without hematuria, site unspecified -Was treated with macrobid and has completed course at this time. will follow up UA C&S at this time.   3. Pernicious anemia -hgb stable, conts on b12 monthly injections  4. Late onset Alzheimer's disease without behavioral disturbance Advanced dementia, no changes in functional status in the last month. conts on namenda.    Janene Harvey. Biagio Borg  [facility] & Adult Medicine [phone] 8 am - 5 pm) [phone] (after hours)

## 2017-04-23 DIAGNOSIS — M810 Age-related osteoporosis without current pathological fracture: Secondary | ICD-10-CM | POA: Diagnosis not present

## 2017-04-23 DIAGNOSIS — N390 Urinary tract infection, site not specified: Secondary | ICD-10-CM | POA: Diagnosis not present

## 2017-04-23 DIAGNOSIS — I482 Chronic atrial fibrillation: Secondary | ICD-10-CM | POA: Diagnosis not present

## 2017-04-23 DIAGNOSIS — R319 Hematuria, unspecified: Secondary | ICD-10-CM | POA: Diagnosis not present

## 2017-04-23 DIAGNOSIS — I10 Essential (primary) hypertension: Secondary | ICD-10-CM | POA: Diagnosis not present

## 2017-04-24 ENCOUNTER — Non-Acute Institutional Stay (SKILLED_NURSING_FACILITY): Payer: Medicare Other | Admitting: Internal Medicine

## 2017-04-24 ENCOUNTER — Encounter: Payer: Self-pay | Admitting: Internal Medicine

## 2017-04-24 DIAGNOSIS — R8279 Other abnormal findings on microbiological examination of urine: Secondary | ICD-10-CM | POA: Diagnosis not present

## 2017-04-24 HISTORY — DX: Other abnormal findings on microbiological examination of urine: R82.79

## 2017-04-24 NOTE — Patient Instructions (Signed)
See assessment and plan under diagnosis acutely for this visit  

## 2017-04-24 NOTE — Progress Notes (Signed)
   NURSING HOME LOCATION:  [facility] ROOM NUMBER:  [id number]  CODE STATUS:  DNR  PCP:  Pecola LawlessHopper, Shterna Laramee F, MD  [address] [place] [id number]  This is a nursing facility follow up for specific acute issue of positive urine culture  Interim medical record and care since [facility] was updated with review of diagnostic studies and change in clinical status since last visit were documented.  HPI: The patient was seen in routine follow-up 04/20/17, urinary retention was documented. Patient has had a Foley catheter but the patient apparently repeatedly pulled out the catheter necessitating it be left out. The patient received Macrobid for possible UTI but the patient continued to complain of dysuria and was having increasing confusion.  The last culture on record was 12/19/16 which revealed multiple species. Similar results were found on culture 11/21. She does have dementia. The patient cannot provide any history. She was afebrile. Suprapubic tenderness was found on exam. Staff reports patient has been voiding without difficulty, family has requested urology follow-up. Post void bladder scan was recommended to assess residual urine. Repeat culture and sensitivity was collected. This revealed greater than 100,000 gram positive cocci, viridans Streptococcus. The urine had revealed white cells too numerous to count and 4+ bacteria.  Review of systems: Dementia invalidated responses. She is unable give me the date. Responses are not related to the question.  Constitutional: No fever,significant weight change, fatigue  Gastrointestinal: No heartburn,dysphagia,abdominal pain, nausea / vomiting,rectal bleeding, melena,change in bowels Genitourinary: No dysuria,hematuria, pyuria,  incontinence, nocturia described at this time Endocrine: No change in hair/skin/ nails, excessive thirst, excessive hunger, excessive urination  Hematologic/lymphatic: No significant bruising,  lymphadenopathy,abnormal bleeding   Physical exam:  Pertinent or positive findings:See BP ;on no BP meds.Similar BP 6/8, but it has been higher previously.. She is thin and somewhat suboptimally nourished. Arcus senilis is present. Teeth are coated with plaque. Decreased left nasolabial fold is present. Heart rhythm and rate are irregular and distant. Soft flow murmur present. Breath sounds are decreased but clear. Equivocal tenderness in suprapubic area. No clinical distention. Pedal pulses decreased.  General appearance: no acute distress , increased work of breathing is present.   Lymphatic: No lymphadenopathy about the head, neck, axilla . Eyes: No conjunctival inflammation or lid edema is present. There is no scleral icterus. Nose:  External nasal examination shows no deformity or inflammation. Nasal mucosa are pink and moist without lesions ,exudates Oral exam: lips and gums are healthy appearing.There is no oropharyngeal erythema or exudate . Neck:  No thyromegaly, masses, tenderness noted.    Heart:  No gallop,  click, rub .  Lungs: without wheezes, rhonchi,rales , rubs. Abdomen:Bowel sounds are normal. Abdomen is soft  with no organomegaly, hernias,masses. GU: deferred  Extremities:  No cyanosis, clubbing,edema  Skin: Warm & dry w/o tenting. No significant lesions or rash.  #1 greater than 100,000 strep viridans on urine culture. Clinical picture suggests colonization.  Up to Date reviewed in reference to streptococcal. viridans related infections. The major concern would be endocarditis, clinically no evidence of endocarditis. Plan: Urology referral is appropriate Initiate antibiotics with penicillin or ceftriaxone should she show any signs of urosepsis or toxicity.

## 2017-04-25 ENCOUNTER — Encounter: Payer: Self-pay | Admitting: Internal Medicine

## 2017-04-25 IMAGING — CT CT CERVICAL SPINE W/O CM
4 of 8 series · 12 of 33 positions shown, 13 images · non-contrast
Comparison: Brain MRI 03/12/2013, head CT 12/31/2012

CLINICAL DATA: Pt with unwitnessed fall. Found on the floor this
morning and patient c/o lower back / pelvis pain. Pt states she
thinks she fell last night. EMS reports patient had a fall yesterday
during the day as well per [facility] staff. Pt with hx of alzheimers

EXAM:
CT HEAD WITHOUT CONTRAST
CT CERVICAL SPINE WITHOUT CONTRAST
TECHNIQUE: Multidetector CT imaging of the head and cervical spine was
performed following the standard protocol without intravenous
contrast. Multiplanar CT image reconstructions of the cervical spine
were also generated.

[Series 8: c-spine st · axial · 0.41mm/px · z∈[-206,-148]mm · 2 of 78 slices shown]
[im 26/78  bone]
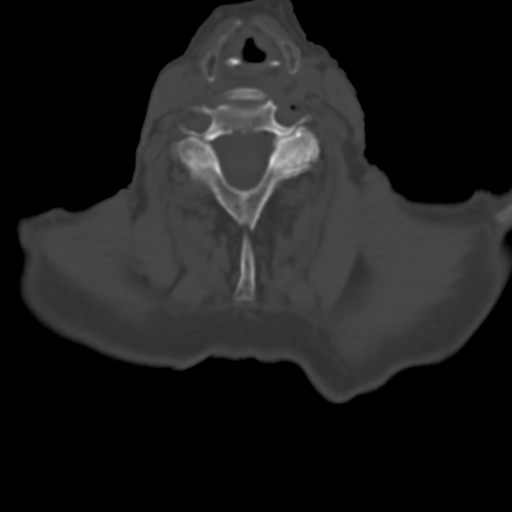
[im 52/78  bone]
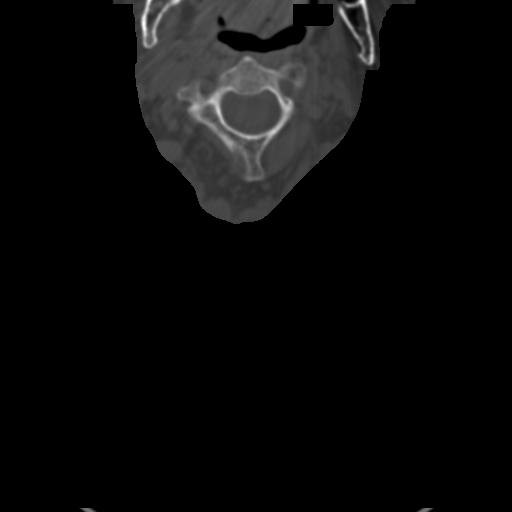

[Series 11: axial recon · axial · 0.40mm/px · z∈[-241,-146]mm · 3 of 96 slices shown, 4 images]
[im 24/96  soft-tissue]
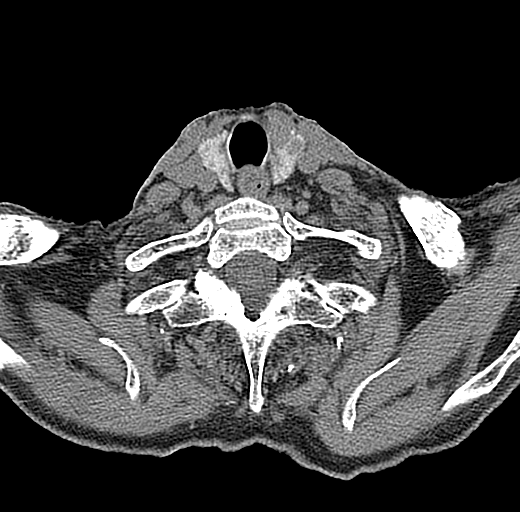
[im 24/96  bone]
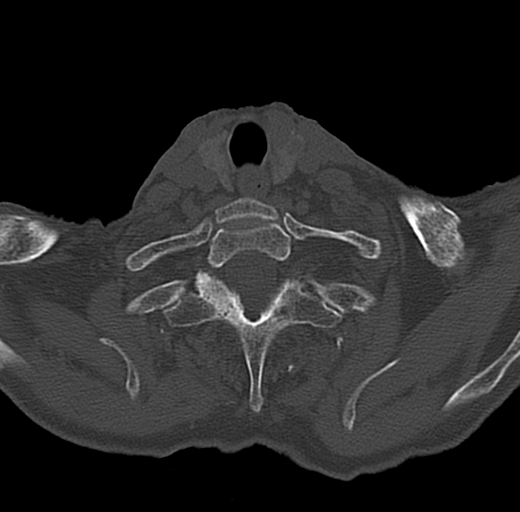
[im 48/96  bone]
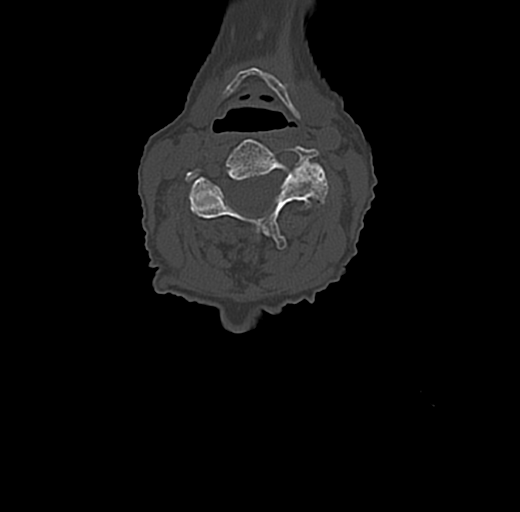
[im 72/96  bone]
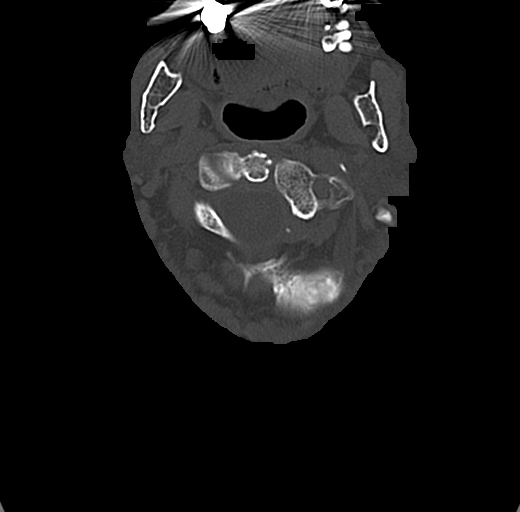

[Series 13: coronal · coronal · 0.23mm/px · 2 of 103 slices shown]
[im 35/103  bone]
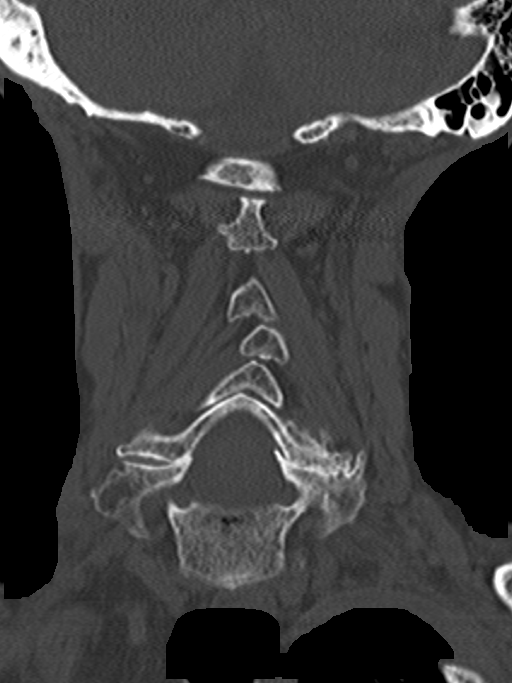
[im 69/103  bone]
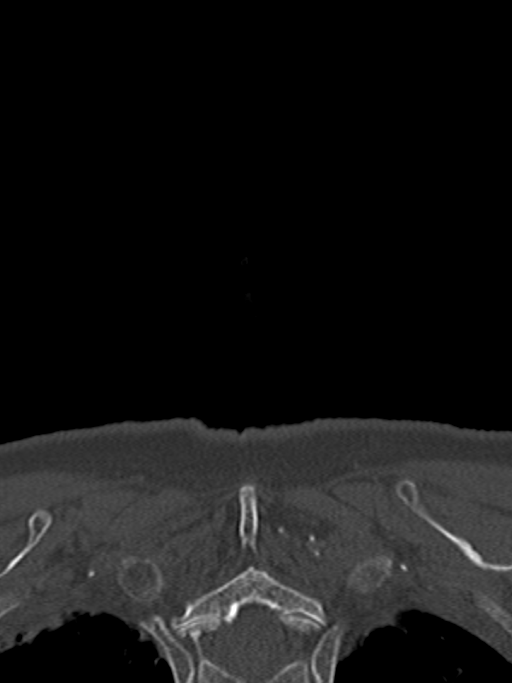

[Series 14: sagittal · sagittal · 0.31mm/px · 5 of 61 slices shown]
[im 11/61  bone]
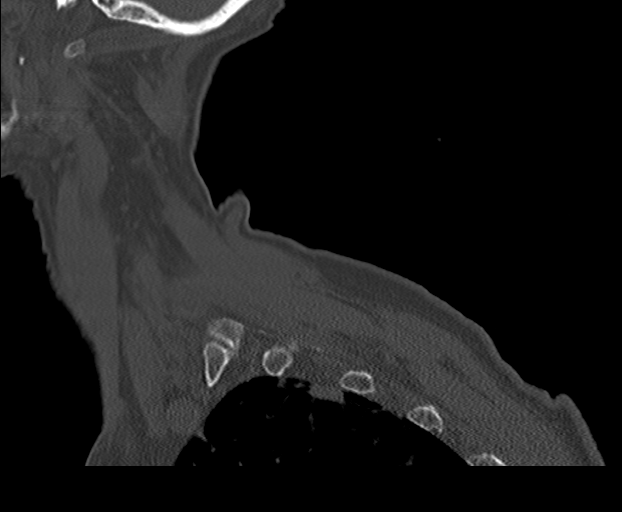
[im 21/61  bone]
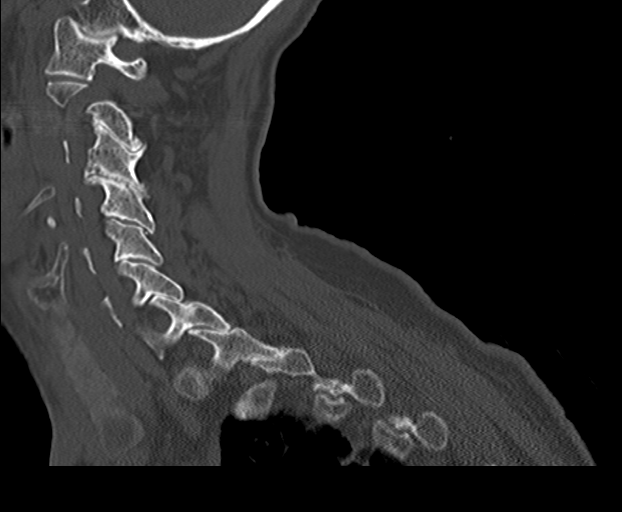
[im 31/61  bone]
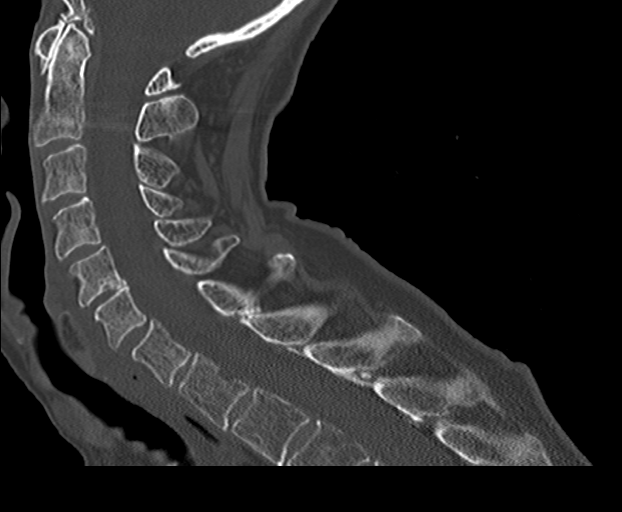
[im 41/61  bone]
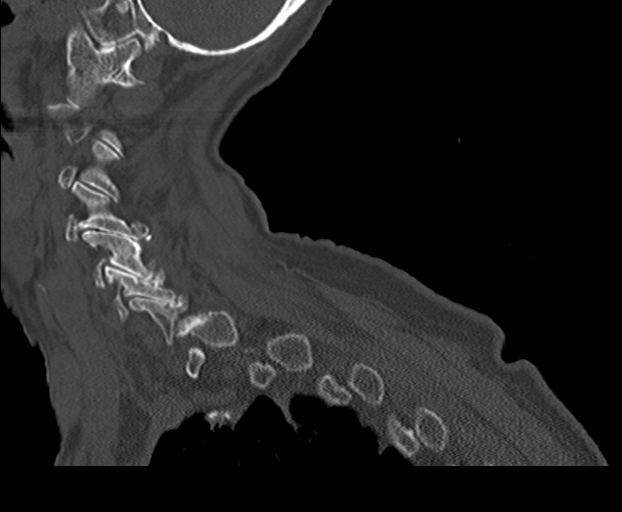
[im 51/61  bone]
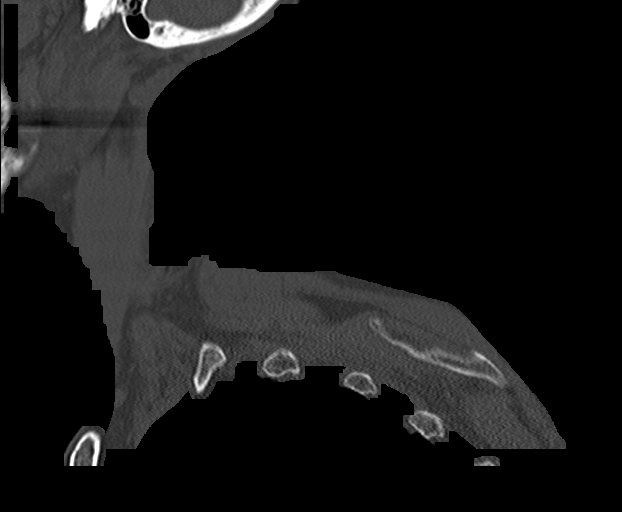

[12 of 33 positions shown; findings below may reference images not displayed]

FINDINGS: CT HEAD FINDINGS

Brain: No acute intracranial hemorrhage. No focal mass lesion. No CT
evidence of acute infarction. No midline shift or mass effect. No
hydrocephalus. Basilar cisterns are patent.

There are periventricular and subcortical white matter
hypodensities. Generalized cortical atrophy.

Vascular: No hyperdense vessel or unexpected calcification.

Skull: Normal. Negative for fracture or focal lesion.

Sinuses/Orbits: No acute finding.

Other: Neck CT 6221.  Chronic CT 7888

CT CERVICAL SPINE FINDINGS

Alignment: Normal.

Skull base and vertebrae: Normal craniocervical junction. Normal
alignment of vertebral bodies. Normal articulation mild degenerate
spurring.

Soft tissues and spinal canal: No prevertebral fluid or swelling. No
visible canal hematoma.

Disc levels:  Unremarkable

Upper chest: There is biapical partially calcified apical thickening
which appears chronic benign

Other: Small amount of gas along the LEFT C6 transverse foramen is
felt to relate to venipuncture (image 31, series 13).
IMPRESSION: 1. No intracranial trauma.
2. Atrophy and microvascular disease advanced from 6221.
3. No cervical spine fracture
4. Biapical calcified pleural parenchymal scarring appears benign.

## 2017-05-01 DIAGNOSIS — N3020 Other chronic cystitis without hematuria: Secondary | ICD-10-CM | POA: Diagnosis not present

## 2017-05-01 DIAGNOSIS — R330 Drug induced retention of urine: Secondary | ICD-10-CM | POA: Diagnosis not present

## 2017-05-03 DIAGNOSIS — I482 Chronic atrial fibrillation: Secondary | ICD-10-CM | POA: Diagnosis not present

## 2017-05-03 DIAGNOSIS — R488 Other symbolic dysfunctions: Secondary | ICD-10-CM | POA: Diagnosis not present

## 2017-05-03 DIAGNOSIS — M6281 Muscle weakness (generalized): Secondary | ICD-10-CM | POA: Diagnosis not present

## 2017-05-03 DIAGNOSIS — R262 Difficulty in walking, not elsewhere classified: Secondary | ICD-10-CM | POA: Diagnosis not present

## 2017-05-04 DIAGNOSIS — R262 Difficulty in walking, not elsewhere classified: Secondary | ICD-10-CM | POA: Diagnosis not present

## 2017-05-04 DIAGNOSIS — I482 Chronic atrial fibrillation: Secondary | ICD-10-CM | POA: Diagnosis not present

## 2017-05-04 DIAGNOSIS — M6281 Muscle weakness (generalized): Secondary | ICD-10-CM | POA: Diagnosis not present

## 2017-05-04 DIAGNOSIS — R488 Other symbolic dysfunctions: Secondary | ICD-10-CM | POA: Diagnosis not present

## 2017-05-05 DIAGNOSIS — R262 Difficulty in walking, not elsewhere classified: Secondary | ICD-10-CM | POA: Diagnosis not present

## 2017-05-05 DIAGNOSIS — M6281 Muscle weakness (generalized): Secondary | ICD-10-CM | POA: Diagnosis not present

## 2017-05-05 DIAGNOSIS — I482 Chronic atrial fibrillation: Secondary | ICD-10-CM | POA: Diagnosis not present

## 2017-05-05 DIAGNOSIS — R488 Other symbolic dysfunctions: Secondary | ICD-10-CM | POA: Diagnosis not present

## 2017-05-07 DIAGNOSIS — M6281 Muscle weakness (generalized): Secondary | ICD-10-CM | POA: Diagnosis not present

## 2017-05-07 DIAGNOSIS — I482 Chronic atrial fibrillation: Secondary | ICD-10-CM | POA: Diagnosis not present

## 2017-05-07 DIAGNOSIS — R488 Other symbolic dysfunctions: Secondary | ICD-10-CM | POA: Diagnosis not present

## 2017-05-07 DIAGNOSIS — R262 Difficulty in walking, not elsewhere classified: Secondary | ICD-10-CM | POA: Diagnosis not present

## 2017-05-08 ENCOUNTER — Encounter: Payer: Self-pay | Admitting: Internal Medicine

## 2017-05-08 DIAGNOSIS — M6281 Muscle weakness (generalized): Secondary | ICD-10-CM | POA: Diagnosis not present

## 2017-05-08 DIAGNOSIS — R488 Other symbolic dysfunctions: Secondary | ICD-10-CM | POA: Diagnosis not present

## 2017-05-08 DIAGNOSIS — I482 Chronic atrial fibrillation: Secondary | ICD-10-CM | POA: Diagnosis not present

## 2017-05-08 DIAGNOSIS — R262 Difficulty in walking, not elsewhere classified: Secondary | ICD-10-CM | POA: Diagnosis not present

## 2017-05-08 DIAGNOSIS — N390 Urinary tract infection, site not specified: Secondary | ICD-10-CM | POA: Insufficient documentation

## 2017-05-09 DIAGNOSIS — R262 Difficulty in walking, not elsewhere classified: Secondary | ICD-10-CM | POA: Diagnosis not present

## 2017-05-09 DIAGNOSIS — R488 Other symbolic dysfunctions: Secondary | ICD-10-CM | POA: Diagnosis not present

## 2017-05-09 DIAGNOSIS — M6281 Muscle weakness (generalized): Secondary | ICD-10-CM | POA: Diagnosis not present

## 2017-05-09 DIAGNOSIS — I482 Chronic atrial fibrillation: Secondary | ICD-10-CM | POA: Diagnosis not present

## 2017-05-10 DIAGNOSIS — R262 Difficulty in walking, not elsewhere classified: Secondary | ICD-10-CM | POA: Diagnosis not present

## 2017-05-10 DIAGNOSIS — R488 Other symbolic dysfunctions: Secondary | ICD-10-CM | POA: Diagnosis not present

## 2017-05-10 DIAGNOSIS — I482 Chronic atrial fibrillation: Secondary | ICD-10-CM | POA: Diagnosis not present

## 2017-05-10 DIAGNOSIS — M6281 Muscle weakness (generalized): Secondary | ICD-10-CM | POA: Diagnosis not present

## 2017-05-11 DIAGNOSIS — I482 Chronic atrial fibrillation: Secondary | ICD-10-CM | POA: Diagnosis not present

## 2017-05-11 DIAGNOSIS — R488 Other symbolic dysfunctions: Secondary | ICD-10-CM | POA: Diagnosis not present

## 2017-05-11 DIAGNOSIS — M6281 Muscle weakness (generalized): Secondary | ICD-10-CM | POA: Diagnosis not present

## 2017-05-11 DIAGNOSIS — R262 Difficulty in walking, not elsewhere classified: Secondary | ICD-10-CM | POA: Diagnosis not present

## 2017-05-12 DIAGNOSIS — M6281 Muscle weakness (generalized): Secondary | ICD-10-CM | POA: Diagnosis not present

## 2017-05-12 DIAGNOSIS — I482 Chronic atrial fibrillation: Secondary | ICD-10-CM | POA: Diagnosis not present

## 2017-05-12 DIAGNOSIS — R262 Difficulty in walking, not elsewhere classified: Secondary | ICD-10-CM | POA: Diagnosis not present

## 2017-05-12 DIAGNOSIS — R488 Other symbolic dysfunctions: Secondary | ICD-10-CM | POA: Diagnosis not present

## 2017-05-13 DIAGNOSIS — I482 Chronic atrial fibrillation: Secondary | ICD-10-CM | POA: Diagnosis not present

## 2017-05-13 DIAGNOSIS — M6281 Muscle weakness (generalized): Secondary | ICD-10-CM | POA: Diagnosis not present

## 2017-05-13 DIAGNOSIS — R262 Difficulty in walking, not elsewhere classified: Secondary | ICD-10-CM | POA: Diagnosis not present

## 2017-05-13 DIAGNOSIS — R488 Other symbolic dysfunctions: Secondary | ICD-10-CM | POA: Diagnosis not present

## 2017-05-14 DIAGNOSIS — I482 Chronic atrial fibrillation: Secondary | ICD-10-CM | POA: Diagnosis not present

## 2017-05-14 DIAGNOSIS — R262 Difficulty in walking, not elsewhere classified: Secondary | ICD-10-CM | POA: Diagnosis not present

## 2017-05-14 DIAGNOSIS — M6281 Muscle weakness (generalized): Secondary | ICD-10-CM | POA: Diagnosis not present

## 2017-05-14 DIAGNOSIS — R488 Other symbolic dysfunctions: Secondary | ICD-10-CM | POA: Diagnosis not present

## 2017-05-15 DIAGNOSIS — M6281 Muscle weakness (generalized): Secondary | ICD-10-CM | POA: Diagnosis not present

## 2017-05-15 DIAGNOSIS — R262 Difficulty in walking, not elsewhere classified: Secondary | ICD-10-CM | POA: Diagnosis not present

## 2017-05-15 DIAGNOSIS — R488 Other symbolic dysfunctions: Secondary | ICD-10-CM | POA: Diagnosis not present

## 2017-05-15 DIAGNOSIS — I482 Chronic atrial fibrillation: Secondary | ICD-10-CM | POA: Diagnosis not present

## 2017-05-16 DIAGNOSIS — R262 Difficulty in walking, not elsewhere classified: Secondary | ICD-10-CM | POA: Diagnosis not present

## 2017-05-16 DIAGNOSIS — I482 Chronic atrial fibrillation: Secondary | ICD-10-CM | POA: Diagnosis not present

## 2017-05-16 DIAGNOSIS — M6281 Muscle weakness (generalized): Secondary | ICD-10-CM | POA: Diagnosis not present

## 2017-05-16 DIAGNOSIS — R488 Other symbolic dysfunctions: Secondary | ICD-10-CM | POA: Diagnosis not present

## 2017-05-17 ENCOUNTER — Non-Acute Institutional Stay (SKILLED_NURSING_FACILITY): Payer: Medicare Other | Admitting: Adult Health

## 2017-05-17 ENCOUNTER — Encounter: Payer: Self-pay | Admitting: Adult Health

## 2017-05-17 DIAGNOSIS — G301 Alzheimer's disease with late onset: Secondary | ICD-10-CM

## 2017-05-17 DIAGNOSIS — R531 Weakness: Secondary | ICD-10-CM | POA: Diagnosis not present

## 2017-05-17 DIAGNOSIS — R262 Difficulty in walking, not elsewhere classified: Secondary | ICD-10-CM | POA: Diagnosis not present

## 2017-05-17 DIAGNOSIS — D510 Vitamin B12 deficiency anemia due to intrinsic factor deficiency: Secondary | ICD-10-CM | POA: Diagnosis not present

## 2017-05-17 DIAGNOSIS — R339 Retention of urine, unspecified: Secondary | ICD-10-CM | POA: Diagnosis not present

## 2017-05-17 DIAGNOSIS — I482 Chronic atrial fibrillation: Secondary | ICD-10-CM | POA: Diagnosis not present

## 2017-05-17 DIAGNOSIS — F0280 Dementia in other diseases classified elsewhere without behavioral disturbance: Secondary | ICD-10-CM

## 2017-05-17 DIAGNOSIS — M6281 Muscle weakness (generalized): Secondary | ICD-10-CM | POA: Diagnosis not present

## 2017-05-17 DIAGNOSIS — R488 Other symbolic dysfunctions: Secondary | ICD-10-CM | POA: Diagnosis not present

## 2017-05-17 NOTE — Progress Notes (Signed)
DATE:  05/17/2017   MRN:  [id number]  BIRTHDAY: May 28, 1920  Facility:  Nursing Home Location:  [facility] Room Number: 101-B  LEVEL OF CARE:  SNF (31)  Contact Information    Name Relation Home Work Mobile   Perdue,Katheleen Daughter [phone]     [facility] Daughter [phone]     Donah Driver   [phone]       Code Status History    Date Active Date Inactive Code Status Order ID Comments User Context   10/04/2016  7:26 PM 10/07/2016  1:54 PM DNR [id number]  Cathren Harsh, MD Inpatient   10/04/2016 12:16 AM 10/04/2016  7:26 PM DNR [id number]  Schorr, Roma Kayser, NP Inpatient   10/03/2016  1:57 PM 10/03/2016  3:35 PM Full Code [id number]  Cathren Harsh, MD Inpatient    Questions for Most Recent Historical Code Status (Order [id number])    Question Answer Comment   In the event of cardiac or respiratory ARREST Do not call a "code blue"    In the event of cardiac or respiratory ARREST Do not perform Intubation, CPR, defibrillation or ACLS    In the event of cardiac or respiratory ARREST Use medication by any route, position, wound care, and other measures to relive pain and suffering. May use oxygen, suction and manual treatment of airway obstruction as needed for comfort.         Advance Directive Documentation     Most Recent Value  Type of Advance Directive  Out of facility DNR (pink MOST or yellow form)  Pre-existing out of facility DNR order (yellow form or pink MOST form)  -  "MOST" Form in Place?  -       Chief Complaint  Patient presents with  . Medical Management of Chronic Issues    Routine visit    HISTORY OF PRESENT ILLNESS:  This is a 3-YO female seen for a routine visit.  She is a long-term care resident of [facility]. She was seen in the room today with daughter @ bedside. She is currently having OT for generalized weakness and ST for cognitive impairment. She was recently  treated with Keflex X 7 days for UTI. She has PMH of Urinary retention and dementia.    PAST MEDICAL HISTORY:  Past Medical History:  Diagnosis Date  . Cataract    bilateral  . Hearing loss   . Memory change   . Migraine   . Mitral stenosis   . Osteoporosis   . Pneumonia   . Positive urine culture 04/24/2017   >100,000 Strep viridans; clinically colonization  . UTI (urinary tract infection)      CURRENT MEDICATIONS: Reviewed  Patient's Medications  New Prescriptions   No medications on file  Previous Medications   AMBULATORY NON FORMULARY MEDICATION    Give 120 ml of MedPass twice daily.   CRANBERRY 450 MG TABS    Take 1 tablet by mouth daily.   CYANOCOBALAMIN (,VITAMIN B-12,) 1000 MCG/ML INJECTION    INJECT EVERY 30 DAYS   GUAIFENESIN (MUCINEX) 600 MG 12 HR TABLET    Take 600 mg by mouth 2 (two) times daily as needed for to loosen phlegm.    LOPERAMIDE (IMODIUM) 2 MG CAPSULE    Take 1 capsule (2 mg total) by mouth 4 (four) times daily as needed for diarrhea or loose stools.   MEMANTINE (NAMENDA) 10 MG TABLET    Take 10 mg by mouth  2 (two) times daily.   VITAMIN C (ASCORBIC ACID) 500 MG TABLET    Take 500 mg by mouth daily.  Modified Medications   No medications on file  Discontinued Medications   No medications on file     No Known Allergies   REVIEW OF SYSTEMS:  Unable to obtain due to dementia    PHYSICAL EXAMINATION  GENERAL APPEARANCE: Well nourished. In no acute distress. Normal body habitus SKIN:  Skin is warm and dry.  HEAD: Normal in size and contour. No evidence of trauma EYES: Lids open and close normally. No blepharitis, entropion or ectropion. PERRL. Conjunctivae are clear and sclerae are white. Lenses are without opacity EARS: Pinnae are normal. Patient hears normal voice tunes of the examiner MOUTH and THROAT: Lips are without lesions. Oral mucosa is moist and without lesions. Tongue is normal in shape, size, and color and without  lesions NECK: supple, trachea midline, no neck masses, no thyroid tenderness, no thyromegaly LYMPHATICS: no LAN in the neck, no supraclavicular LAN RESPIRATORY: breathing is even & unlabored, BS CTAB CARDIAC: RRR, no murmur,no extra heart sounds, no edema GI: abdomen soft, normal BS, no masses, no tenderness, no hepatomegaly, no splenomegaly EXTREMITIES:  No cyanosis, no clubbing PSYCHIATRIC: Alert to self, disoriented to time and place. Affect and behavior are appropriate   LABS/RADIOLOGY: Labs reviewed: Basic Metabolic Panel:  Recent Labs  16/08/9610/22/17 0308 10/05/16 0359 10/06/16 0334 02/13/17  NA 140 143 141 141  K 3.3* 3.3* 3.9 4.0  CL 117* 118* 115*  --   CO2 18* 20* 21*  --   GLUCOSE 92 91 96  --   BUN 40* 25* 13 23*  CREATININE 0.95 0.71 0.71 0.4*  CALCIUM 8.1* 8.1* 8.3*  --    Liver Function Tests:  Recent Labs  10/03/16 1101 02/13/17  AST 20 17  ALT 14 16  ALKPHOS 61 65  BILITOT 1.0  --   PROT 7.3  --   ALBUMIN 4.2  --    CBC:  Recent Labs  09/27/16 1651  10/03/16 1101 10/04/16 0308 10/05/16 0359 02/13/17  WBC 4.3  --  9.0 5.7 5.3 4.6  NEUTROABS 2.6  --  7.2  --   --   --   HGB 12.9  < > 14.4 10.3* 10.1* 11.9*  HCT 38.5  < > 41.3 30.1* 30.0* 36  MCV 95.1  --  90.6 93.2 90.1  --   PLT 293  --  276 215 203 181  < > = values in this interval not displayed. Cardiac Enzymes:  Recent Labs  10/03/16 1101 10/03/16 1546 10/03/16 2115 10/04/16 0308 10/04/16 0850 10/05/16 0359  CKTOTAL 306*  --   --   --  209 111  TROPONINI  --  0.18* 0.19* 0.18*  --   --     ASSESSMENT/PLAN:  1. Generalized weakness - continue OT for therapeutic strengthening exercises; fall precautions   2. Urinary retention - follows up with urology, Dr. Mena GoesEskridge, continue in and out catheterization daily, monitor output   3. Late onset Alzheimer's disease without behavioral disturbance - continue ST for cognitive impairment, memantine 10 mg 1 tab by mouth twice a day,  supportive care and fall precautions   4. Pernicious anemia -  continue cyanocobalamin 1000 g/ML IM monthly Lab Results  Component Value Date   HGB 11.9 (A) 02/13/2017       Goals of care:  Long-term care    Monina C. Medina-Vargas - NP  [place] [phone]

## 2017-05-18 DIAGNOSIS — R262 Difficulty in walking, not elsewhere classified: Secondary | ICD-10-CM | POA: Diagnosis not present

## 2017-05-18 DIAGNOSIS — R488 Other symbolic dysfunctions: Secondary | ICD-10-CM | POA: Diagnosis not present

## 2017-05-18 DIAGNOSIS — I482 Chronic atrial fibrillation: Secondary | ICD-10-CM | POA: Diagnosis not present

## 2017-05-18 DIAGNOSIS — M6281 Muscle weakness (generalized): Secondary | ICD-10-CM | POA: Diagnosis not present

## 2017-05-21 DIAGNOSIS — M6281 Muscle weakness (generalized): Secondary | ICD-10-CM | POA: Diagnosis not present

## 2017-05-21 DIAGNOSIS — R262 Difficulty in walking, not elsewhere classified: Secondary | ICD-10-CM | POA: Diagnosis not present

## 2017-05-21 DIAGNOSIS — R488 Other symbolic dysfunctions: Secondary | ICD-10-CM | POA: Diagnosis not present

## 2017-05-21 DIAGNOSIS — I482 Chronic atrial fibrillation: Secondary | ICD-10-CM | POA: Diagnosis not present

## 2017-05-22 DIAGNOSIS — I482 Chronic atrial fibrillation: Secondary | ICD-10-CM | POA: Diagnosis not present

## 2017-05-22 DIAGNOSIS — M6281 Muscle weakness (generalized): Secondary | ICD-10-CM | POA: Diagnosis not present

## 2017-05-22 DIAGNOSIS — R488 Other symbolic dysfunctions: Secondary | ICD-10-CM | POA: Diagnosis not present

## 2017-05-22 DIAGNOSIS — R262 Difficulty in walking, not elsewhere classified: Secondary | ICD-10-CM | POA: Diagnosis not present

## 2017-05-23 DIAGNOSIS — R262 Difficulty in walking, not elsewhere classified: Secondary | ICD-10-CM | POA: Diagnosis not present

## 2017-05-23 DIAGNOSIS — R488 Other symbolic dysfunctions: Secondary | ICD-10-CM | POA: Diagnosis not present

## 2017-05-23 DIAGNOSIS — I482 Chronic atrial fibrillation: Secondary | ICD-10-CM | POA: Diagnosis not present

## 2017-05-23 DIAGNOSIS — M6281 Muscle weakness (generalized): Secondary | ICD-10-CM | POA: Diagnosis not present

## 2017-05-24 DIAGNOSIS — R262 Difficulty in walking, not elsewhere classified: Secondary | ICD-10-CM | POA: Diagnosis not present

## 2017-05-24 DIAGNOSIS — I482 Chronic atrial fibrillation: Secondary | ICD-10-CM | POA: Diagnosis not present

## 2017-05-24 DIAGNOSIS — M6281 Muscle weakness (generalized): Secondary | ICD-10-CM | POA: Diagnosis not present

## 2017-05-24 DIAGNOSIS — R488 Other symbolic dysfunctions: Secondary | ICD-10-CM | POA: Diagnosis not present

## 2017-05-25 DIAGNOSIS — I482 Chronic atrial fibrillation: Secondary | ICD-10-CM | POA: Diagnosis not present

## 2017-05-25 DIAGNOSIS — M6281 Muscle weakness (generalized): Secondary | ICD-10-CM | POA: Diagnosis not present

## 2017-05-25 DIAGNOSIS — R488 Other symbolic dysfunctions: Secondary | ICD-10-CM | POA: Diagnosis not present

## 2017-05-25 DIAGNOSIS — I739 Peripheral vascular disease, unspecified: Secondary | ICD-10-CM | POA: Diagnosis not present

## 2017-05-25 DIAGNOSIS — R262 Difficulty in walking, not elsewhere classified: Secondary | ICD-10-CM | POA: Diagnosis not present

## 2017-05-25 DIAGNOSIS — B351 Tinea unguium: Secondary | ICD-10-CM | POA: Diagnosis not present

## 2017-05-26 DIAGNOSIS — I482 Chronic atrial fibrillation: Secondary | ICD-10-CM | POA: Diagnosis not present

## 2017-05-26 DIAGNOSIS — R262 Difficulty in walking, not elsewhere classified: Secondary | ICD-10-CM | POA: Diagnosis not present

## 2017-05-26 DIAGNOSIS — R488 Other symbolic dysfunctions: Secondary | ICD-10-CM | POA: Diagnosis not present

## 2017-05-26 DIAGNOSIS — M6281 Muscle weakness (generalized): Secondary | ICD-10-CM | POA: Diagnosis not present

## 2017-05-28 DIAGNOSIS — R488 Other symbolic dysfunctions: Secondary | ICD-10-CM | POA: Diagnosis not present

## 2017-05-28 DIAGNOSIS — M6281 Muscle weakness (generalized): Secondary | ICD-10-CM | POA: Diagnosis not present

## 2017-05-28 DIAGNOSIS — R262 Difficulty in walking, not elsewhere classified: Secondary | ICD-10-CM | POA: Diagnosis not present

## 2017-05-28 DIAGNOSIS — I482 Chronic atrial fibrillation: Secondary | ICD-10-CM | POA: Diagnosis not present

## 2017-05-29 DIAGNOSIS — R488 Other symbolic dysfunctions: Secondary | ICD-10-CM | POA: Diagnosis not present

## 2017-05-29 DIAGNOSIS — I482 Chronic atrial fibrillation: Secondary | ICD-10-CM | POA: Diagnosis not present

## 2017-05-29 DIAGNOSIS — M6281 Muscle weakness (generalized): Secondary | ICD-10-CM | POA: Diagnosis not present

## 2017-05-29 DIAGNOSIS — R262 Difficulty in walking, not elsewhere classified: Secondary | ICD-10-CM | POA: Diagnosis not present

## 2017-05-30 DIAGNOSIS — R262 Difficulty in walking, not elsewhere classified: Secondary | ICD-10-CM | POA: Diagnosis not present

## 2017-05-30 DIAGNOSIS — M6281 Muscle weakness (generalized): Secondary | ICD-10-CM | POA: Diagnosis not present

## 2017-05-30 DIAGNOSIS — R488 Other symbolic dysfunctions: Secondary | ICD-10-CM | POA: Diagnosis not present

## 2017-05-30 DIAGNOSIS — I482 Chronic atrial fibrillation: Secondary | ICD-10-CM | POA: Diagnosis not present

## 2017-06-12 ENCOUNTER — Non-Acute Institutional Stay (SKILLED_NURSING_FACILITY): Payer: Medicare Other | Admitting: Adult Health

## 2017-06-12 ENCOUNTER — Encounter: Payer: Self-pay | Admitting: Adult Health

## 2017-06-12 DIAGNOSIS — Z9181 History of falling: Secondary | ICD-10-CM

## 2017-06-12 DIAGNOSIS — R339 Retention of urine, unspecified: Secondary | ICD-10-CM

## 2017-06-12 NOTE — Progress Notes (Signed)
DATE:  06/12/2017   MRN:  [id number]  BIRTHDAY: 11/24/1919  Facility:  Nursing Home Location:  [facility] Room Number: 101-B  LEVEL OF CARE:  SNF (31)  Contact Information    Name Relation Home Work Mobile   Perdue,Katheleen Daughter [phone]     [facility]Lynn Daughter [phone]     Donah DriverWiglesworth,James Son   [phone]       Code Status History    Date Active Date Inactive Code Status Order ID Comments User Context   10/04/2016  7:26 PM 10/07/2016  1:54 PM DNR [id number]  Cathren Harshai, Ripudeep K, MD Inpatient   10/04/2016 12:16 AM 10/04/2016  7:26 PM DNR [id number]  Schorr, Roma KayserKatherine P, NP Inpatient   10/03/2016  1:57 PM 10/03/2016  3:35 PM Full Code [id number]  Cathren Harshai, Ripudeep K, MD Inpatient    Questions for Most Recent Historical Code Status (Order [id number])    Question Answer Comment   In the event of cardiac or respiratory ARREST Do not call a "code blue"    In the event of cardiac or respiratory ARREST Do not perform Intubation, CPR, defibrillation or ACLS    In the event of cardiac or respiratory ARREST Use medication by any route, position, wound care, and other measures to relive pain and suffering. May use oxygen, suction and manual treatment of airway obstruction as needed for comfort.         Advance Directive Documentation     Most Recent Value  Type of Advance Directive  Out of facility DNR (pink MOST or yellow form)  Pre-existing out of facility DNR order (yellow form or pink MOST form)  -  "MOST" Form in Place?  -       Chief Complaint  Patient presents with  . Acute Visit    S/P Fall   HISTORY OF PRESENT ILLNESS:  This is a 83-YO female seen for an acute visit S/P fall.  She fell while trying to get in the bed. She was seen in her room while sitting on her recliner. She was seen trying to remove dressing from her right elbow skin tear. When asked what is her name, she responded,"It burns." while trying to remove dressing  on her right elbow.  She is a long-term care resident of [facility].   She has a PMH of urinary retention and dementia.     PAST MEDICAL HISTORY:  Past Medical History:  Diagnosis Date  . Cataract    bilateral  . Hearing loss   . Memory change   . Migraine   . Mitral stenosis   . Osteoporosis   . Pneumonia   . Positive urine culture 04/24/2017   >100,000 Strep viridans; clinically colonization  . UTI (urinary tract infection)      CURRENT MEDICATIONS: Reviewed  Patient's Medications  New Prescriptions   No medications on file  Previous Medications   AMBULATORY NON FORMULARY MEDICATION    Give 120 ml of MedPass twice daily.   CRANBERRY 450 MG TABS    Take 1 tablet by mouth daily.   CYANOCOBALAMIN (,VITAMIN B-12,) 1000 MCG/ML INJECTION    INJECT 1ML EVERY 30 DAYS   GUAIFENESIN (MUCINEX) 600 MG 12 HR TABLET    Take 600 mg by mouth 2 (two) times daily as needed for to loosen phlegm.    LOPERAMIDE (IMODIUM) 2 MG CAPSULE    Take 1 capsule (2 mg total) by mouth 4 (four) times daily as needed for  diarrhea or loose stools.   MEMANTINE (NAMENDA) 10 MG TABLET    Take 10 mg by mouth 2 (two) times daily.   VITAMIN C (ASCORBIC ACID) 500 MG TABLET    Take 500 mg by mouth daily.  Modified Medications   No medications on file  Discontinued Medications   No medications on file     No Known Allergies   REVIEW OF SYSTEMS:  Unable to obtain due to dementia    PHYSICAL EXAMINATION  GENERAL APPEARANCE:  In no acute distress.  SKIN:  Right elbow skin tear is dry, covered with dry dressing HEAD: Normal in size and contour. No evidence of trauma EYES: Lids open and close normally. No blepharitis, entropion or ectropion.  EARS: Pinnae are normal. Patient hears normal voice tunes of the examiner MOUTH and THROAT: Lips are without lesions. Oral mucosa is moist and without lesions. Tongue is normal in shape, size, and color and without lesions RESPIRATORY: breathing  is even & unlabored, BS CTAB CARDIAC: RRR, no murmur,no extra heart sounds, no edema GI: abdomen soft, normal BS, no masses, no tenderness, no hepatomegaly, no splenomegaly EXTREMITIES:  Able to move X 4 extremities  PSYCHIATRIC: Disoriented to person, time and place. Affect and behavior are appropriate   LABS/RADIOLOGY: Labs reviewed: Basic Metabolic Panel:  Recent Labs  16/08/9610/22/17 0308 10/05/16 0359 10/06/16 0334 02/13/17  NA 140 143 141 141  K 3.3* 3.3* 3.9 4.0  CL 117* 118* 115*  --   CO2 18* 20* 21*  --   GLUCOSE 92 91 96  --   BUN 40* 25* 13 23*  CREATININE 0.95 0.71 0.71 0.4*  CALCIUM 8.1* 8.1* 8.3*  --    Liver Function Tests:  Recent Labs  10/03/16 1101 02/13/17  AST 20 17  ALT 14 16  ALKPHOS 61 65  BILITOT 1.0  --   PROT 7.3  --   ALBUMIN 4.2  --    CBC:  Recent Labs  09/27/16 1651  10/03/16 1101 10/04/16 0308 10/05/16 0359 02/13/17  WBC 4.3  --  9.0 5.7 5.3 4.6  NEUTROABS 2.6  --  7.2  --   --   --   HGB 12.9  < > 14.4 10.3* 10.1* 11.9*  HCT 38.5  < > 41.3 30.1* 30.0* 36  MCV 95.1  --  90.6 93.2 90.1  --   PLT 293  --  276 215 203 181  < > = values in this interval not displayed. Cardiac Enzymes:  Recent Labs  10/03/16 1101 10/03/16 1546 10/03/16 2115 10/04/16 0308 10/04/16 0850 10/05/16 0359  CKTOTAL 306*  --   --   --  209 111  TROPONINI  --  0.18* 0.19* 0.18*  --   --     ASSESSMENT/PLAN:  1. Status post fall - patient is confused X 3 and needing frequent/close monitoring, continue supportive care; check CBC and BMP  2. Urinary retention -  Currently having in and out urinary catheterization BID, will check for urinalysis with culture and sensitivity to R/O UTI     Kyliegh Jester C. Medina-Vargas - NP    [facility] [phone]

## 2017-06-13 DIAGNOSIS — D649 Anemia, unspecified: Secondary | ICD-10-CM | POA: Diagnosis not present

## 2017-06-13 DIAGNOSIS — G629 Polyneuropathy, unspecified: Secondary | ICD-10-CM | POA: Diagnosis not present

## 2017-06-13 DIAGNOSIS — I482 Chronic atrial fibrillation: Secondary | ICD-10-CM | POA: Diagnosis not present

## 2017-06-13 DIAGNOSIS — I10 Essential (primary) hypertension: Secondary | ICD-10-CM | POA: Diagnosis not present

## 2017-06-13 LAB — CBC AND DIFFERENTIAL
HEMATOCRIT: 36 (ref 36–46)
HEMOGLOBIN: 12.2 (ref 12.0–16.0)
Neutrophils Absolute: 4
Platelets: 176 (ref 150–399)
WBC: 6.4

## 2017-06-13 LAB — BASIC METABOLIC PANEL
BUN: 24 — AB (ref 4–21)
Creatinine: 0.5 (ref 0.5–1.1)
GLUCOSE: 86
POTASSIUM: 4.4 (ref 3.4–5.3)
SODIUM: 143 (ref 137–147)

## 2017-06-14 DIAGNOSIS — R319 Hematuria, unspecified: Secondary | ICD-10-CM | POA: Diagnosis not present

## 2017-06-14 DIAGNOSIS — I482 Chronic atrial fibrillation: Secondary | ICD-10-CM | POA: Diagnosis not present

## 2017-06-14 DIAGNOSIS — G629 Polyneuropathy, unspecified: Secondary | ICD-10-CM | POA: Diagnosis not present

## 2017-06-14 DIAGNOSIS — I10 Essential (primary) hypertension: Secondary | ICD-10-CM | POA: Diagnosis not present

## 2017-06-14 DIAGNOSIS — N390 Urinary tract infection, site not specified: Secondary | ICD-10-CM | POA: Diagnosis not present

## 2017-06-18 DIAGNOSIS — D649 Anemia, unspecified: Secondary | ICD-10-CM | POA: Diagnosis not present

## 2017-06-18 DIAGNOSIS — I482 Chronic atrial fibrillation: Secondary | ICD-10-CM | POA: Diagnosis not present

## 2017-06-18 DIAGNOSIS — G629 Polyneuropathy, unspecified: Secondary | ICD-10-CM | POA: Diagnosis not present

## 2017-06-18 DIAGNOSIS — I10 Essential (primary) hypertension: Secondary | ICD-10-CM | POA: Diagnosis not present

## 2017-06-18 LAB — CBC AND DIFFERENTIAL
HCT: 37 (ref 36–46)
HEMOGLOBIN: 12.5 (ref 12.0–16.0)
Neutrophils Absolute: 3
Platelets: 181 (ref 150–399)
WBC: 5.6

## 2017-06-19 ENCOUNTER — Encounter: Payer: Self-pay | Admitting: Internal Medicine

## 2017-06-19 ENCOUNTER — Non-Acute Institutional Stay (SKILLED_NURSING_FACILITY): Payer: Medicare Other

## 2017-06-19 DIAGNOSIS — Z0000 Encounter for general adult medical examination without abnormal findings: Secondary | ICD-10-CM | POA: Diagnosis not present

## 2017-06-19 NOTE — Progress Notes (Signed)
Opened in error

## 2017-06-19 NOTE — Patient Instructions (Signed)
Lori [name] , Thank you for taking time to come for your Medicare Wellness Visit. I appreciate your ongoing commitment to your health goals. Please review the following plan we discussed and let me know if I can assist you in the future.   Screening recommendations/referrals: Colonoscopy excluded, pt over age 81 Mammogram excluded, pt over age 81 Bone Density due, excluded due to dementia and age Recommended yearly ophthalmology/optometry visit for glaucoma screening and checkup Recommended yearly dental visit for hygiene and checkup  Vaccinations: Influenza vaccine due 2018 fall season Pneumococcal vaccine up to date Tdap vaccine up to date. Due 01/12/27 Shingles vaccine not in records  Advanced directives: DNR in chart, copies of health care power of attorney and living will are needed   Conditions/risks identified: None  Next appointment: Dr. Alwyn RenHopper makes rounds   Preventive Care 65 Years and Older, Female Preventive care refers to lifestyle choices and visits with your health care provider that can promote health and wellness. What does preventive care include?  A yearly physical exam. This is also called an annual well check.  Dental exams once or twice a year.  Routine eye exams. Ask your health care provider how often you should have your eyes checked.  Personal lifestyle choices, including:  Daily care of your teeth and gums.  Regular physical activity.  Eating a healthy diet.  Avoiding tobacco and drug use.  Limiting alcohol use.  Practicing safe sex.  Taking low-dose aspirin every day.  Taking vitamin and mineral supplements as recommended by your health care provider. What happens during an annual well check? The services and screenings done by your health care provider during your annual well check will depend on your age, overall health, lifestyle risk factors, and family history of disease. Counseling  Your health care provider may ask you questions  about your:  Alcohol use.  Tobacco use.  Drug use.  Emotional well-being.  Home and relationship well-being.  Sexual activity.  Eating habits.  History of falls.  Memory and ability to understand (cognition).  Work and work Astronomerenvironment.  Reproductive health. Screening  You may have the following tests or measurements:  Height, weight, and BMI.  Blood pressure.  Lipid and cholesterol levels. These may be checked every 5 years, or more frequently if you are over 81 years old.  Skin check.  Lung cancer screening. You may have this screening every year starting at age 81 if you have a 30-pack-year history of smoking and currently smoke or have quit within the past 15 years.  Fecal occult blood test (FOBT) of the stool. You may have this test every year starting at age 81.  Flexible sigmoidoscopy or colonoscopy. You may have a sigmoidoscopy every 5 years or a colonoscopy every 10 years starting at age 81.  Hepatitis C blood test.  Hepatitis B blood test.  Sexually transmitted disease (STD) testing.  Diabetes screening. This is done by checking your blood sugar (glucose) after you have not eaten for a while (fasting). You may have this done every 1-3 years.  Bone density scan. This is done to screen for osteoporosis. You may have this done starting at age 81.  Mammogram. This may be done every 1-2 years. Talk to your health care provider about how often you should have regular mammograms. Talk with your health care provider about your test results, treatment options, and if necessary, the need for more tests. Vaccines  Your health care provider may recommend certain vaccines, such as:  Influenza  vaccine. This is recommended every year.  Tetanus, diphtheria, and acellular pertussis (Tdap, Td) vaccine. You may need a Td booster every 10 years.  Zoster vaccine. You may need this after age 64.  Pneumococcal 13-valent conjugate (PCV13) vaccine. One dose is  recommended after age 80.  Pneumococcal polysaccharide (PPSV23) vaccine. One dose is recommended after age 63. Talk to your health care provider about which screenings and vaccines you need and how often you need them. This information is not intended to replace advice given to you by your health care provider. Make sure you discuss any questions you have with your health care provider. Document Released: 11/26/2015 Document Revised: 07/19/2016 Document Reviewed: 08/31/2015 Elsevier Interactive Patient Education  2017 [place] Prevention in the Home Falls can cause injuries. They can happen to people of all ages. There are many things you can do to make your home safe and to help prevent falls. What can I do on the outside of my home?  Regularly fix the edges of walkways and driveways and fix any cracks.  Remove anything that might make you trip as you walk through a door, such as a raised step or threshold.  Trim any bushes or trees on the path to your home.  Use bright outdoor lighting.  Clear any walking paths of anything that might make someone trip, such as rocks or tools.  Regularly check to see if handrails are loose or broken. Make sure that both sides of any steps have handrails.  Any raised decks and porches should have guardrails on the edges.  Have any leaves, snow, or ice cleared regularly.  Use sand or salt on walking paths during winter.  Clean up any spills in your garage right away. This includes oil or grease spills. What can I do in the bathroom?  Use night lights.  Install grab bars by the toilet and in the tub and shower. Do not use towel bars as grab bars.  Use non-skid mats or decals in the tub or shower.  If you need to sit down in the shower, use a plastic, non-slip stool.  Keep the floor dry. Clean up any water that spills on the floor as soon as it happens.  Remove soap buildup in the tub or shower regularly.  Attach bath mats  securely with double-sided non-slip rug tape.  Do not have throw rugs and other things on the floor that can make you trip. What can I do in the bedroom?  Use night lights.  Make sure that you have a light by your bed that is easy to reach.  Do not use any sheets or blankets that are too big for your bed. They should not hang down onto the floor.  Have a firm chair that has side arms. You can use this for support while you get dressed.  Do not have throw rugs and other things on the floor that can make you trip. What can I do in the kitchen?  Clean up any spills right away.  Avoid walking on wet floors.  Keep items that you use a lot in easy-to-reach places.  If you need to reach something above you, use a strong step stool that has a grab bar.  Keep electrical cords out of the way.  Do not use floor polish or wax that makes floors slippery. If you must use wax, use non-skid floor wax.  Do not have throw rugs and other things on the floor that can  make you trip. What can I do with my stairs?  Do not leave any items on the stairs.  Make sure that there are handrails on both sides of the stairs and use them. Fix handrails that are broken or loose. Make sure that handrails are as long as the stairways.  Check any carpeting to make sure that it is firmly attached to the stairs. Fix any carpet that is loose or worn.  Avoid having throw rugs at the top or bottom of the stairs. If you do have throw rugs, attach them to the floor with carpet tape.  Make sure that you have a light switch at the top of the stairs and the bottom of the stairs. If you do not have them, ask someone to add them for you. What else can I do to help prevent falls?  Wear shoes that:  Do not have high heels.  Have rubber bottoms.  Are comfortable and fit you well.  Are closed at the toe. Do not wear sandals.  If you use a stepladder:  Make sure that it is fully opened. Do not climb a closed  stepladder.  Make sure that both sides of the stepladder are locked into place.  Ask someone to hold it for you, if possible.  Clearly mark and make sure that you can see:  Any grab bars or handrails.  First and last steps.  Where the edge of each step is.  Use tools that help you move around (mobility aids) if they are needed. These include:  Canes.  Walkers.  Scooters.  Crutches.  Turn on the lights when you go into a dark area. Replace any light bulbs as soon as they burn out.  Set up your furniture so you have a clear path. Avoid moving your furniture around.  If any of your floors are uneven, fix them.  If there are any pets around you, be aware of where they are.  Review your medicines with your doctor. Some medicines can make you feel dizzy. This can increase your chance of falling. Ask your doctor what other things that you can do to help prevent falls. This information is not intended to replace advice given to you by your health care provider. Make sure you discuss any questions you have with your health care provider. Document Released: 08/26/2009 Document Revised: 04/06/2016 Document Reviewed: 12/04/2014 Elsevier Interactive Patient Education  2017 [employer].

## 2017-06-19 NOTE — Progress Notes (Signed)
Subjective:   Lori [name] is a 83 y.o. female who presents for an Initial Medicare Annual Wellness Visit at [facility];incapacitated patient unable to answer questions appropriately     Objective:    Today's Vitals   06/19/17 1041  BP: 120/60  Pulse: 68  Temp: 97.7 F (36.5 C)  TempSrc: Oral  SpO2: 93%  Weight: 104 lb (47.2 kg)  Height: 5\' 2"  (1.575 m)   Body mass index is 19.02 kg/m.   Current Medications (verified) Outpatient Encounter Prescriptions as of 06/19/2017  Medication Sig  . AMBULATORY NON FORMULARY MEDICATION Give 120 ml of MedPass twice daily.  . Cranberry 450 MG TABS Take 1 tablet by mouth daily.  . cyanocobalamin (,VITAMIN B-12,) 1000 MCG/ML injection INJECT EVERY 30 DAYS  . guaiFENesin (MUCINEX) 600 MG 12 hr tablet Take 600 mg by mouth 2 (two) times daily as needed for to loosen phlegm.   . loperamide (IMODIUM) 2 MG capsule Take 1 capsule (2 mg total) by mouth 4 (four) times daily as needed for diarrhea or loose stools.  . memantine (NAMENDA) 10 MG tablet Take 10 mg by mouth 2 (two) times daily.  . vitamin C (ASCORBIC ACID) 500 MG tablet Take 500 mg by mouth daily.   No facility-administered encounter medications on file as of 06/19/2017.     Allergies (verified) Patient has no known allergies.   History: Past Medical History:  Diagnosis Date  . Cataract    bilateral  . Hearing loss   . Memory change   . Migraine   . Mitral stenosis   . Osteoporosis   . Pneumonia   . Positive urine culture 04/24/2017   >100,000 Strep viridans; clinically colonization  . UTI (urinary tract infection)    Past Surgical History:  Procedure Laterality Date  . ABDOMINAL HYSTERECTOMY    . APPENDECTOMY    . BILATERAL SALPINGOOPHORECTOMY    . EYE SURGERY     bilateral  . TUBAL LIGATION     Family History  Problem Relation Age of Onset  . Heart Problems Mother   . Heart Problems Father    Social History   Occupational History  .  Retired Retired   Social History Main Topics  . Smoking status: Former Smoker    Packs/day: 0.50    Years: 24.00    Types: Cigarettes    Quit date: 11/13/1965  . Smokeless tobacco: Never Used  . Alcohol use 0.6 oz/week    1 Glasses of wine per week     Comment: occ  . Drug use: No  . Sexual activity: No    Tobacco Counseling Counseling given: Not Answered   Activities of Daily Living In your present state of health, do you have any difficulty performing the following activities: 06/19/2017 10/03/2016  Hearing? Y Y  Comment - WEARS HEARING AIDS  Vision? Y N  Difficulty concentrating or making decisions? Malvin Johns  Walking or climbing stairs? Y Y  Dressing or bathing? Y N  Doing errands, shopping? Malvin Johns  Preparing Food and eating ? Y -  Using the Toilet? Y -  In the past six months, have you accidently leaked urine? Y -  Do you have problems with loss of bowel control? Y -  Managing your Medications? Y -  Managing your Finances? Y -  Housekeeping or managing your Housekeeping? Y -  Some recent data might be hidden    Immunizations and Health Maintenance Immunization History  Administered Date(s)  Administered  . Influenza Split 08/13/2012, 08/06/2013, 07/25/2014  . Influenza Whole 09/02/2007, 09/02/2008, 08/12/2009, 07/24/2011  . Influenza, High Dose Seasonal PF 08/07/2015  . PPD Test 09/14/2015, 12/19/2016  . Pneumococcal Conjugate-13 09/16/2015  . Pneumococcal Polysaccharide-23 11/13/2001, 09/02/2007  . Td 11/14/1995, 09/02/2007  . Tdap 01/11/2017   Health Maintenance Due  Topic Date Due  . INFLUENZA VACCINE  06/13/2017    Patient Care Team: Pecola Lawless, MD as PCP - General (Internal Medicine)  Indicate any recent Medical Services you may have received from other than [facility] providers in the past year (date may be approximate).     Assessment:   This is a routine wellness examination for Lori [name].   Hearing/Vision screen No exam data present  Dietary issues and  exercise activities discussed: Current Exercise Habits: The patient does not participate in regular exercise at present, Exercise limited by: neurologic condition(s);orthopedic condition(s)  Goals    None     Depression Screen PHQ 2/9 Scores 06/19/2017 08/10/2015 04/13/2014  PHQ - 2 Score 0 0 0    Fall Risk Fall Risk  06/19/2017 02/12/2017 08/10/2015 04/13/2014  Falls in the past year? Yes No Yes No  Number falls in past yr: 1 - 2 or more -  Injury with Fall? Yes - Yes -  Comment head pain - - -  Risk Factor Category  - - High Fall Risk -  Risk for fall due to : - - History of fall(s) -  Follow up - - Falls prevention discussed -    Cognitive Function: MMSE - Mini Mental State Exam 07/31/2016 11/26/2014  Orientation to time 3 5  Orientation to Place 5 5  Registration 3 3  Attention/ Calculation 5 5  Recall 0 3  Language- name 2 objects 2 2  Language- repeat 1 1  Language- follow 3 step command 3 1  Language- read & follow direction 1 1  Write a sentence 1 1  Copy design 1 1  Total score 25 28     6CIT Screen 06/19/2017  What Year? 4 points  What month? 3 points  What time? 0 points  Count back from 20 4 points  Months in reverse 4 points  Repeat phrase 10 points  Total Score 25    Screening Tests Health Maintenance  Topic Date Due  . INFLUENZA VACCINE  06/13/2017  . DEXA SCAN  11/14/2023 (Originally 08/16/1987)  . TETANUS/TDAP  01/12/2027  . PNA vac Low Risk Adult  Completed      Plan:    I have personally reviewed and addressed the Medicare Annual Wellness questionnaire and have noted the following in the patient's chart:  A. Medical and social history B. Use of alcohol, tobacco or illicit drugs  C. Current medications and supplements D. Functional ability and status E.  Nutritional status F.  Physical activity G. Advance directives H. List of other physicians I.  Hospitalizations, surgeries, and ER visits in previous 12 months J.  Vitals K. Screenings to include  hearing, vision, cognitive, depression L. Referrals and appointments - none  In addition, I have reviewed and discussed with patient certain preventive protocols, quality metrics, and best practice recommendations. A written personalized care plan for preventive services as well as general preventive health recommendations were provided to patient.  See attached scanned questionnaire for additional information.   Signed,   Annetta Maw, RN Nurse Health Advisor   Quick Notes   Health Maintenance: DEXA due-not ordered due to dementia and age  Abnormal Screen: 6 CIT-25     Patient Concerns: None     Nurse Concerns: None  I have personally reviewed the health advisor's clinical note, was available for consultation, and agree with the assessment and plan as written. Pecola LawlessWilliam F Hopper M.D., FACP, [facility]

## 2017-06-20 DIAGNOSIS — R319 Hematuria, unspecified: Secondary | ICD-10-CM | POA: Diagnosis not present

## 2017-06-20 DIAGNOSIS — N390 Urinary tract infection, site not specified: Secondary | ICD-10-CM | POA: Diagnosis not present

## 2017-06-28 ENCOUNTER — Encounter: Payer: Self-pay | Admitting: Adult Health

## 2017-06-28 ENCOUNTER — Non-Acute Institutional Stay (SKILLED_NURSING_FACILITY): Payer: Medicare Other | Admitting: Adult Health

## 2017-06-28 DIAGNOSIS — F0280 Dementia in other diseases classified elsewhere without behavioral disturbance: Secondary | ICD-10-CM

## 2017-06-28 DIAGNOSIS — G4700 Insomnia, unspecified: Secondary | ICD-10-CM

## 2017-06-28 DIAGNOSIS — G301 Alzheimer's disease with late onset: Secondary | ICD-10-CM | POA: Diagnosis not present

## 2017-06-28 DIAGNOSIS — R339 Retention of urine, unspecified: Secondary | ICD-10-CM | POA: Diagnosis not present

## 2017-06-28 NOTE — Progress Notes (Signed)
DATE:  06/28/2017   MRN:  [id number]  BIRTHDAY: 04/28/1920  Facility:  Nursing Home Location:  [facility] Room Number: 101-B  LEVEL OF CARE:  SNF (31)  Contact Information    Name Relation Home Work Mobile   Perdue,Katheleen Daughter [phone]     [facility] Center[facility]iglesworth,Lynn Daughter [phone]     Donah DriverWiglesworth,James Son   [phone]       Code Status History    Date Active Date Inactive Code Status Order ID Comments User Context   10/04/2016  7:26 PM 10/07/2016  1:54 PM DNR [id number]  Cathren Harshai, Ripudeep K, MD Inpatient   10/04/2016 12:16 AM 10/04/2016  7:26 PM DNR [id number]  Schorr, Roma KayserKatherine P, NP Inpatient   10/03/2016  1:57 PM 10/03/2016  3:35 PM Full Code [id number]  Cathren Harshai, Ripudeep K, MD Inpatient    Questions for Most Recent Historical Code Status (Order [id number])    Question Answer Comment   In the event of cardiac or respiratory ARREST Do not call a "code blue"    In the event of cardiac or respiratory ARREST Do not perform Intubation, CPR, defibrillation or ACLS    In the event of cardiac or respiratory ARREST Use medication by any route, position, wound care, and other measures to relive pain and suffering. May use oxygen, suction and manual treatment of airway obstruction as needed for comfort.         Advance Directive Documentation     Most Recent Value  Type of Advance Directive  Out of facility DNR (pink MOST or yellow form)  Pre-existing out of facility DNR order (yellow form or pink MOST form)  -  "MOST" Form in Place?  -       Chief Complaint  Patient presents with  . Medical Management of Chronic Issues    Routine visit    HISTORY OF PRESENT ILLNESS:  This is a 83-YO female seen for a routine visit.  [facility]  She has a PMH of dementia and urinary retention.  She was seen in the room today while sitting on her recliner. She was reported to be awake and wanders @  night.     PAST MEDICAL HISTORY:  Past Medical History:  Diagnosis Date  . Cataract    bilateral  . Hearing loss   . Memory change   . Migraine   . Mitral stenosis   . Osteoporosis   . Pneumonia   . Positive urine culture 04/24/2017   >100,000 Strep viridans; clinically colonization  . UTI (urinary tract infection)      CURRENT MEDICATIONS: Reviewed  Patient's Medications  New Prescriptions   No medications on file  Previous Medications   AMBULATORY NON FORMULARY MEDICATION    Give 120 ml of MedPass twice daily.   CRANBERRY 450 MG TABS    Take 1 tablet by mouth daily.   CYANOCOBALAMIN (,VITAMIN B-12,) 1000 MCG/ML INJECTION    INJECT 1ML EVERY 30 DAYS   GUAIFENESIN (MUCINEX) 600 MG 12 HR TABLET    Take 600 mg by mouth 2 (two) times daily as needed for to loosen phlegm.    LOPERAMIDE (IMODIUM A-D) 2 MG TABLET    Take 2 mg by mouth every 4 (four) hours as needed for diarrhea or loose stools.   MEMANTINE (NAMENDA) 10 MG TABLET    Take 10 mg by mouth 2 (two) times daily.   TAMSULOSIN (FLOMAX) 0.4 MG CAPS  CAPSULE    Take 0.4 mg by mouth every evening.   VITAMIN C (ASCORBIC ACID) 500 MG TABLET    Take 500 mg by mouth daily.  Modified Medications   No medications on file  Discontinued Medications   LOPERAMIDE (IMODIUM) 2 MG CAPSULE    Take 1 capsule (2 mg total) by mouth 4 (four) times daily as needed for diarrhea or loose stools.     No Known Allergies   REVIEW OF SYSTEMS:    Unable to obtain due to Dementia     PHYSICAL EXAMINATION  GENERAL APPEARANCE:  In no acute distress.  SKIN:  Skin is warm and dry.  HEAD: Normal in size and contour. No evidence of trauma EYES: Lids open and close normally. No blepharitis, entropion or ectropion. MOUTH and THROAT: Lips are without lesions. Oral mucosa is moist and without lesions. RESPIRATORY: breathing is even & unlabored, BS CTAB CARDIAC: RRR, no murmur,no extra heart sounds, no edema GI: abdomen soft, normal BS, no masses, no  tenderness EXTREMITIES:  Able to move X 4 extremities PSYCHIATRIC:  Affect and behavior are appropriate   LABS/RADIOLOGY: Labs reviewed: Basic Metabolic Panel:  Recent Labs  16/10/96 0308 10/05/16 0359 10/06/16 0334 02/13/17 06/13/17  NA 140 143 141 141 143  K 3.3* 3.3* 3.9 4.0 4.4  CL 117* 118* 115*  --   --   CO2 18* 20* 21*  --   --   GLUCOSE 92 91 96  --   --   BUN 40* 25* 13 23* 24*  CREATININE 0.95 0.71 0.71 0.4* 0.5  CALCIUM 8.1* 8.1* 8.3*  --   --    Liver Function Tests:  Recent Labs  10/03/16 1101 02/13/17  AST 20 17  ALT 14 16  ALKPHOS 61 65  BILITOT 1.0  --   PROT 7.3  --   ALBUMIN 4.2  --    CBC:  Recent Labs  10/03/16 1101 10/04/16 0308 10/05/16 0359 02/13/17 06/13/17 06/18/17  WBC 9.0 5.7 5.3 4.6 6.4 5.6  NEUTROABS 7.2  --   --   --  4 3  HGB 14.4 10.3* 10.1* 11.9* 12.2 12.5  HCT 41.3 30.1* 30.0* 36 36 37  MCV 90.6 93.2 90.1  --   --   --   PLT 276 215 203 181 176 181   Cardiac Enzymes:  Recent Labs  10/03/16 1101 10/03/16 1546 10/03/16 2115 10/04/16 0308 10/04/16 0850 10/05/16 0359  CKTOTAL 306*  --   --   --  209 111  TROPONINI  --  0.18* 0.19* 0.18*  --   --     ASSESSMENT/PLAN:  1. Insomnia, unspecified type - start Melatonin 3 mg 1 tab PO Q HS PRN   2. Urinary retention - continue in and out catheterization and Flomax 0.4 mg 1 capsule daily   3. Late onset Alzheimer's disease without behavioral disturbance - continue Memantine 10 mg 1 tab BID, supportive care and fall precautions      Goals of care:  Long-term care    Lori [name] - NP   [employer] [phone]

## 2017-07-23 ENCOUNTER — Non-Acute Institutional Stay (SKILLED_NURSING_FACILITY): Payer: Medicare Other | Admitting: Adult Health

## 2017-07-23 ENCOUNTER — Encounter: Payer: Self-pay | Admitting: Adult Health

## 2017-07-23 DIAGNOSIS — G301 Alzheimer's disease with late onset: Secondary | ICD-10-CM | POA: Diagnosis not present

## 2017-07-23 DIAGNOSIS — R05 Cough: Secondary | ICD-10-CM | POA: Diagnosis not present

## 2017-07-23 DIAGNOSIS — R059 Cough, unspecified: Secondary | ICD-10-CM

## 2017-07-23 DIAGNOSIS — F0280 Dementia in other diseases classified elsewhere without behavioral disturbance: Secondary | ICD-10-CM | POA: Diagnosis not present

## 2017-07-23 DIAGNOSIS — R339 Retention of urine, unspecified: Secondary | ICD-10-CM | POA: Diagnosis not present

## 2017-07-23 DIAGNOSIS — G4700 Insomnia, unspecified: Secondary | ICD-10-CM | POA: Diagnosis not present

## 2017-07-23 NOTE — Progress Notes (Signed)
DATE:  07/23/2017   MRN:  [id number]  BIRTHDAY: 01/22/1920  Facility:  Nursing Home Location:  [facility] Room Number: 101-B  LEVEL OF CARE:  SNF (31)  Contact Information    Name Relation Home Work Mobile   Perdue,Katheleen Daughter [phone]     [facility]Lynn Daughter [phone]     Donah DriverWiglesworth,James Son   [phone]       Code Status History    Date Active Date Inactive Code Status Order ID Comments User Context   10/04/2016  7:26 PM 10/07/2016  1:54 PM DNR [id number]  Cathren Harshai, Ripudeep K, MD Inpatient   10/04/2016 12:16 AM 10/04/2016  7:26 PM DNR [id number]  Schorr, Roma KayserKatherine P, NP Inpatient   10/03/2016  1:57 PM 10/03/2016  3:35 PM Full Code [id number]  Cathren Harshai, Ripudeep K, MD Inpatient    Questions for Most Recent Historical Code Status (Order [id number])    Question Answer Comment   In the event of cardiac or respiratory ARREST Do not call a "code blue"    In the event of cardiac or respiratory ARREST Do not perform Intubation, CPR, defibrillation or ACLS    In the event of cardiac or respiratory ARREST Use medication by any route, position, wound care, and other measures to relive pain and suffering. May use oxygen, suction and manual treatment of airway obstruction as needed for comfort.         Advance Directive Documentation     Most Recent Value  Type of Advance Directive  Out of facility DNR (pink MOST or yellow form)  Pre-existing out of facility DNR order (yellow form or pink MOST form)  -  "MOST" Form in Place?  -       Chief Complaint  Patient presents with  . Medical Management of Chronic Issues    Routine    HISTORY OF PRESENT ILLNESS:  This is a 83-YO female seen for a routine visit.  [facility]. She has a PMH of urinary retention and dementia. She was seen in her room today. She was noted to have occasional productive cough. No reported fever. She was  recently started on Melatonin PRN for insomnia.    PAST MEDICAL HISTORY:  Past Medical History:  Diagnosis Date  . Cataract    bilateral  . Hearing loss   . Memory change   . Migraine   . Mitral stenosis   . Osteoporosis   . Pneumonia   . Positive urine culture 04/24/2017   >100,000 Strep viridans; clinically colonization  . UTI (urinary tract infection)      CURRENT MEDICATIONS: Reviewed  Patient's Medications  New Prescriptions   No medications on file  Previous Medications   AMBULATORY NON FORMULARY MEDICATION    Give 120 ml of MedPass twice daily.   CRANBERRY 450 MG TABS    Take 1 tablet by mouth daily.   CYANOCOBALAMIN (,VITAMIN B-12,) 1000 MCG/ML INJECTION    INJECT 1ML EVERY 30 DAYS   IPRATROPIUM-ALBUTEROL (DUONEB) 0.5-2.5 (3) MG/3ML SOLN    Take 3 mLs by nebulization every 6 (six) hours as needed (SOB).   LOPERAMIDE (IMODIUM A-D) 2 MG TABLET    Take 2 mg by mouth every 4 (four) hours as needed for diarrhea or loose stools.   MELATONIN 3 MG TABS    Take 1 tablet by mouth at bedtime as needed.   MEMANTINE (NAMENDA) 10 MG TABLET    Take 10  mg by mouth 2 (two) times daily.   TAMSULOSIN (FLOMAX) 0.4 MG CAPS CAPSULE    Take 0.4 mg by mouth every evening.   VITAMIN C (ASCORBIC ACID) 500 MG TABLET    Take 500 mg by mouth daily.  Modified Medications   No medications on file  Discontinued Medications   GUAIFENESIN (MUCINEX) 600 MG 12 HR TABLET    Take 600 mg by mouth 2 (two) times daily as needed for to loosen phlegm.      No Known Allergies   REVIEW OF SYSTEMS:  Unable to obtain due to alzheimer's dementia    PHYSICAL EXAMINATION  GENERAL APPEARANCE:  In no acute distress. SKIN:  Skin is warm and dry.  MOUTH and THROAT: Lips are without lesions. Oral mucosa is moist and without lesions. RESPIRATORY: breathing is even & unlabored, BS CTAB CARDIAC: RRR, no murmur,no extra heart sounds, no edema GI: abdomen soft, normal BS, no masses, no tenderness, no  hepatomegaly, no splenomegaly EXTREMITIES:  Able to move X 4 extremities PSYCHIATRIC: Alert to self, disoriented to time and place. Affect and behavior are appropriate   LABS/RADIOLOGY: Labs reviewed: Basic Metabolic Panel:  Recent Labs  16/10/96 0308 10/05/16 0359 10/06/16 0334 02/13/17 06/13/17  NA 140 143 141 141 143  K 3.3* 3.3* 3.9 4.0 4.4  CL 117* 118* 115*  --   --   CO2 18* 20* 21*  --   --   GLUCOSE 92 91 96  --   --   BUN 40* 25* 13 23* 24*  CREATININE 0.95 0.71 0.71 0.4* 0.5  CALCIUM 8.1* 8.1* 8.3*  --   --    Liver Function Tests:  Recent Labs  10/03/16 1101 02/13/17  AST 20 17  ALT 14 16  ALKPHOS 61 65  BILITOT 1.0  --   PROT 7.3  --   ALBUMIN 4.2  --    CBC:  Recent Labs  10/03/16 1101 10/04/16 0308 10/05/16 0359 02/13/17 06/13/17 06/18/17  WBC 9.0 5.7 5.3 4.6 6.4 5.6  NEUTROABS 7.2  --   --   --  4 3  HGB 14.4 10.3* 10.1* 11.9* 12.2 12.5  HCT 41.3 30.1* 30.0* 36 36 37  MCV 90.6 93.2 90.1  --   --   --   PLT 276 215 203 181 176 181   Cardiac Enzymes:  Recent Labs  10/03/16 1101 10/03/16 1546 10/03/16 2115 10/04/16 0308 10/04/16 0850 10/05/16 0359  CKTOTAL 306*  --   --   --  209 111  TROPONINI  --  0.18* 0.19* 0.18*  --   --     ASSESSMENT/PLAN:  1. Cough - start Mucinex 600 mg 1 tab BID X 7 days and ipratropium albuterol neb every 6 hours when necessary   2. Late onset Alzheimer's disease without behavioral disturbance - Continue memantine 10 mg 1 tab twice a day, supportive care and fall precautions   3. Insomnia, unspecified type - stable, continue melatonin 3 mg 1 tab daily at bedtime   4. Urinary retention - continue Flomax 0.4 mg 1 capsule evening and in and out catheterization PRN     Goals of care:  Long-term care    Monina C. Medina-Vargas - NP    [employer] [phone]

## 2017-07-24 DIAGNOSIS — R05 Cough: Secondary | ICD-10-CM | POA: Diagnosis not present

## 2017-08-01 DIAGNOSIS — R330 Drug induced retention of urine: Secondary | ICD-10-CM | POA: Diagnosis not present

## 2017-08-15 ENCOUNTER — Non-Acute Institutional Stay (SKILLED_NURSING_FACILITY): Payer: Medicare Other | Admitting: Adult Health

## 2017-08-15 ENCOUNTER — Encounter: Payer: Self-pay | Admitting: Adult Health

## 2017-08-15 DIAGNOSIS — G301 Alzheimer's disease with late onset: Secondary | ICD-10-CM

## 2017-08-15 DIAGNOSIS — R339 Retention of urine, unspecified: Secondary | ICD-10-CM | POA: Diagnosis not present

## 2017-08-15 DIAGNOSIS — G4700 Insomnia, unspecified: Secondary | ICD-10-CM | POA: Diagnosis not present

## 2017-08-15 DIAGNOSIS — D510 Vitamin B12 deficiency anemia due to intrinsic factor deficiency: Secondary | ICD-10-CM | POA: Diagnosis not present

## 2017-08-15 DIAGNOSIS — F0280 Dementia in other diseases classified elsewhere without behavioral disturbance: Secondary | ICD-10-CM

## 2017-08-15 NOTE — Progress Notes (Signed)
DATE:  08/15/2017   MRN:  [id number]  BIRTHDAY: 03/05/1920  Facility:  Nursing Home Location:  [facility] Room Number: 101-B  LEVEL OF CARE:  SNF (31)  Contact Information    Name Relation Home Work Mobile   Perdue,Katheleen Daughter [phone]     [facility] Daughter [phone]     Donah Driver   [phone]       Code Status History    Date Active Date Inactive Code Status Order ID Comments User Context   10/04/2016  7:26 PM 10/07/2016  1:54 PM DNR [id number]  Cathren Harsh, MD Inpatient   10/04/2016 12:16 AM 10/04/2016  7:26 PM DNR [id number]  Schorr, Roma Kayser, NP Inpatient   10/03/2016  1:57 PM 10/03/2016  3:35 PM Full Code [id number]  Cathren Harsh, MD Inpatient    Questions for Most Recent Historical Code Status (Order [id number])    Question Answer Comment   In the event of cardiac or respiratory ARREST Do not call a "code blue"    In the event of cardiac or respiratory ARREST Do not perform Intubation, CPR, defibrillation or ACLS    In the event of cardiac or respiratory ARREST Use medication by any route, position, wound care, and other measures to relive pain and suffering. May use oxygen, suction and manual treatment of airway obstruction as needed for comfort.         Advance Directive Documentation     Most Recent Value  Type of Advance Directive  Out of facility DNR (pink MOST or yellow form)  Pre-existing out of facility DNR order (yellow form or pink MOST form)  -  "MOST" Form in Place?  -       Chief Complaint  Patient presents with  . Medical Management of Chronic Issues    Routine visit    HISTORY OF PRESENT ILLNESS:  This is a 89-YO female seen for a routine visit.  She is a long-term care resident of [facility].  She has a PMH of urinary retention, cataracts, hearing loss, mitral stenosis, osteoporosis, and dementia. She was seen in the room today. She has been stable  for the past month.    PAST MEDICAL HISTORY:  Past Medical History:  Diagnosis Date  . Cataract    bilateral  . Hearing loss   . Memory change   . Migraine   . Mitral stenosis   . Osteoporosis   . Pneumonia   . Positive urine culture 04/24/2017   >100,000 Strep viridans; clinically colonization  . UTI (urinary tract infection)      CURRENT MEDICATIONS: Reviewed  Patient's Medications  New Prescriptions   No medications on file  Previous Medications   AMBULATORY NON FORMULARY MEDICATION    Give 120 ml of MedPass twice daily.   CRANBERRY 450 MG TABS    Take 1 tablet by mouth daily.   CYANOCOBALAMIN (,VITAMIN B-12,) 1000 MCG/ML INJECTION    INJECT EVERY 30 DAYS   DEXTROMETHORPHAN-GUAIFENESIN (ROBAFEN DM [facility]GH/CHEST CONGEST PO)    Take 10 mLs by mouth 3 (three) times daily. 6AM, 2PM, 10PM   GUAIFENESIN (MUCINEX) 600 MG 12 HR TABLET    Take 600 mg by mouth 2 (two) times daily as needed for to loosen phlegm.   IPRATROPIUM-ALBUTEROL (DUONEB) 0.5-2.5 (3) MG/3ML SOLN    Take 3 mLs by nebulization every 6 (six) hours as needed (SOB).   LOPERAMIDE (IMODIUM A-D) 2 MG TABLET  Take 2 mg by mouth every 4 (four) hours as needed for diarrhea or loose stools.   MELATONIN 3 MG TABS    Take 1 tablet by mouth at bedtime as needed.   MEMANTINE (NAMENDA) 10 MG TABLET    Take 10 mg by mouth 2 (two) times daily.   NUTRITIONAL SUPPLEMENT LIQD    Take 120 mLs by mouth 2 (two) times daily.   TAMSULOSIN (FLOMAX) 0.4 MG CAPS CAPSULE    Take 0.4 mg by mouth every evening.   VITAMIN C (ASCORBIC ACID) 500 MG TABLET    Take 500 mg by mouth daily.  Modified Medications   No medications on file  Discontinued Medications   No medications on file     No Known Allergies   REVIEW OF SYSTEMS:  Unable to obtain due to dementia    PHYSICAL EXAMINATION  GENERAL APPEARANCE: Well nourished. In no acute distress. Normal body habitus SKIN:  Skin is warm and dry. MOUTH and THROAT: Lips are without  lesions. Oral mucosa is moist and without lesions. RESPIRATORY: breathing is even & unlabored, BS CTAB CARDIAC: RRR, no murmur,no extra heart sounds, no edema GI: abdomen soft, normal BS, no masses, no tenderness, no hepatomegaly, no splenomegaly EXTREMITIES: Able to move X 4 extremities PSYCHIATRIC:  Affect and behavior are appropriate    LABS/RADIOLOGY: Labs reviewed: Basic Metabolic Panel:  Recent Labs  56/43/32 0308 10/05/16 0359 10/06/16 0334 02/13/17 06/13/17  NA 140 143 141 141 143  K 3.3* 3.3* 3.9 4.0 4.4  CL 117* 118* 115*  --   --   CO2 18* 20* 21*  --   --   GLUCOSE 92 91 96  --   --   BUN 40* 25* 13 23* 24*  CREATININE 0.95 0.71 0.71 0.4* 0.5  CALCIUM 8.1* 8.1* 8.3*  --   --    Liver Function Tests:  Recent Labs  10/03/16 1101 02/13/17  AST 20 17  ALT 14 16  ALKPHOS 61 65  BILITOT 1.0  --   PROT 7.3  --   ALBUMIN 4.2  --    CBC:  Recent Labs  10/03/16 1101 10/04/16 0308 10/05/16 0359 02/13/17 06/13/17 06/18/17  WBC 9.0 5.7 5.3 4.6 6.4 5.6  NEUTROABS 7.2  --   --   --  4 3  HGB 14.4 10.3* 10.1* 11.9* 12.2 12.5  HCT 41.3 30.1* 30.0* 36 36 37  MCV 90.6 93.2 90.1  --   --   --   PLT 276 215 203 181 176 181   Cardiac Enzymes:  Recent Labs  10/03/16 1101 10/03/16 1546 10/03/16 2115 10/04/16 0308 10/04/16 0850 10/05/16 0359  CKTOTAL 306*  --   --   --  209 111  TROPONINI  --  0.18* 0.19* 0.18*  --   --     ASSESSMENT/PLAN:  1. Insomnia, unspecified type - continue Melatonin 3 mg 1 tab PO Q HS PRN  2. Urinary retention - continue Tamsulosin 0.4 mg 1 capsule PO Q evening   3. Late onset Alzheimer's disease without behavioral disturbance - Continue memantine 10 mg 1 tab twice a day, supportive care, fall precautions   4. Pernicious anemia - continue cyanocobalamin 1000 g/ML injection IM monthly     Goals of care:  Long-term care    Ami Mally C. Medina-Vargas - NP    [employer] [phone]

## 2017-08-22 DIAGNOSIS — R05 Cough: Secondary | ICD-10-CM | POA: Diagnosis not present

## 2017-08-24 DIAGNOSIS — I10 Essential (primary) hypertension: Secondary | ICD-10-CM | POA: Diagnosis not present

## 2017-08-24 DIAGNOSIS — Z9181 History of falling: Secondary | ICD-10-CM | POA: Diagnosis not present

## 2017-09-18 ENCOUNTER — Encounter: Payer: Self-pay | Admitting: Internal Medicine

## 2017-09-18 ENCOUNTER — Non-Acute Institutional Stay (SKILLED_NURSING_FACILITY): Payer: Medicare Other | Admitting: Internal Medicine

## 2017-09-18 DIAGNOSIS — R627 Adult failure to thrive: Secondary | ICD-10-CM | POA: Diagnosis not present

## 2017-09-18 DIAGNOSIS — N390 Urinary tract infection, site not specified: Secondary | ICD-10-CM

## 2017-09-18 DIAGNOSIS — I4891 Unspecified atrial fibrillation: Secondary | ICD-10-CM | POA: Diagnosis not present

## 2017-09-18 NOTE — Assessment & Plan Note (Signed)
Urology will continue to follow the patient, continue prophylactic cranberry tablets

## 2017-09-18 NOTE — Assessment & Plan Note (Addendum)
09/18/17 clinically rhythm only slightly irregular &  rate controlled

## 2017-09-18 NOTE — Progress Notes (Signed)
    NURSING HOME LOCATION:  [facility] ROOM NUMBER:  101-A  CODE STATUS:  DNR  PCP:  Pecola LawlessHopper, Shastina Rua F, MD  [address] [place] [id number]  This is a nursing facility follow up of chronic medical diagnoses  Interim medical record and care since [facility] was updated with review of diagnostic studies and change in clinical status since last visit were documented.  HPI: The patient is permanent resident of the facility with medical diagnoses of atrial fibrillation, hypertension, mitral stenosis, COPD with bronchospasm, dementia with history of hallucinations, peripheral neuropathy, and pernicious anemia. A major issue is recurrent urinary tract infections for which she's been seen by urology. Urine cultures in Epic reveal multiple species, not definite UTI. In June of this year she did have over 100,000 strep viridans without symptoms suggesting colonization. She has adult failure to thrive. Labs are not current. She's had minimal azotemia, but other labs have been essentially normal.  Review of systems: Dementia invalidated responses. Patient essentially does not answer questions. On occasion she will make a single comment such as "tired, bladder, back".  Physical exam:  Pertinent or positive findings: She appears chronically ill and suboptimally nourished. She is profoundly hard of hearing despite wearing hearing aid on the left. When questioned the response is usually a blank stare. She does smile sweetly intermittently. Heart slightly irregular; breath sounds decreased. The comment "bladder" was made with abdominal palpation. She said "back pain" when I moved her legs back onto the bed. She was lying in a semi-right lateral decubitus position with her legs hanging off the bed. Limbs are atrophic.Pedal pulses are decreased. There is an anti-wandering bracelet on the left ankle.Urine odor to pampers.  General appearance:Adequately nourished; no acute distress ,  increased work of breathing is present.   Lymphatic: No lymphadenopathy about the head, neck, axilla . Eyes: No conjunctival inflammation or lid edema is present. There is no scleral icterus. Ears:  External ear exam shows no significant lesions or deformities.   Nose:  External nasal examination shows no deformity or inflammation. Nasal mucosa are pink and moist without lesions ,exudates Oral exam: lips and gums are healthy appearing.There is no oropharyngeal erythema or exudate . Neck:  No thyromegaly, masses, tenderness noted.    Heart:  No gallop, murmur, click, rub .  Lungs: without wheezes, rhonchi,rales , rubs. Abdomen:Bowel sounds are normal. Abdomen is soft  with no organomegaly, hernias,masses. GU: deferred  Extremities:  No cyanosis, clubbing,edema  Skin: Warm & dry w/o tenting. No significant lesions or rash.  See summary under each active problem in the Problem List with associated updated therapeutic plan

## 2017-09-18 NOTE — Assessment & Plan Note (Addendum)
DO NOT RESUSCITATE status; comfort care appropriate Lab monitor not indicated

## 2017-09-18 NOTE — Patient Instructions (Signed)
See assessment and plan under each diagnosis in the problem list and acutely for this visit 

## 2017-09-19 DIAGNOSIS — N390 Urinary tract infection, site not specified: Secondary | ICD-10-CM | POA: Diagnosis not present

## 2017-09-19 DIAGNOSIS — Z79899 Other long term (current) drug therapy: Secondary | ICD-10-CM | POA: Diagnosis not present

## 2017-09-19 DIAGNOSIS — R319 Hematuria, unspecified: Secondary | ICD-10-CM | POA: Diagnosis not present

## 2017-09-19 DIAGNOSIS — M6281 Muscle weakness (generalized): Secondary | ICD-10-CM | POA: Diagnosis not present

## 2017-09-25 DIAGNOSIS — N390 Urinary tract infection, site not specified: Secondary | ICD-10-CM | POA: Diagnosis not present

## 2017-09-25 DIAGNOSIS — R262 Difficulty in walking, not elsewhere classified: Secondary | ICD-10-CM | POA: Diagnosis not present

## 2017-09-26 DIAGNOSIS — N390 Urinary tract infection, site not specified: Secondary | ICD-10-CM | POA: Diagnosis not present

## 2017-09-26 DIAGNOSIS — R262 Difficulty in walking, not elsewhere classified: Secondary | ICD-10-CM | POA: Diagnosis not present

## 2017-09-27 DIAGNOSIS — N390 Urinary tract infection, site not specified: Secondary | ICD-10-CM | POA: Diagnosis not present

## 2017-09-27 DIAGNOSIS — R262 Difficulty in walking, not elsewhere classified: Secondary | ICD-10-CM | POA: Diagnosis not present

## 2017-09-28 DIAGNOSIS — N390 Urinary tract infection, site not specified: Secondary | ICD-10-CM | POA: Diagnosis not present

## 2017-09-28 DIAGNOSIS — I739 Peripheral vascular disease, unspecified: Secondary | ICD-10-CM | POA: Diagnosis not present

## 2017-09-28 DIAGNOSIS — R262 Difficulty in walking, not elsewhere classified: Secondary | ICD-10-CM | POA: Diagnosis not present

## 2017-09-28 DIAGNOSIS — B351 Tinea unguium: Secondary | ICD-10-CM | POA: Diagnosis not present

## 2017-10-01 DIAGNOSIS — R262 Difficulty in walking, not elsewhere classified: Secondary | ICD-10-CM | POA: Diagnosis not present

## 2017-10-01 DIAGNOSIS — N390 Urinary tract infection, site not specified: Secondary | ICD-10-CM | POA: Diagnosis not present

## 2017-10-02 DIAGNOSIS — N390 Urinary tract infection, site not specified: Secondary | ICD-10-CM | POA: Diagnosis not present

## 2017-10-02 DIAGNOSIS — R262 Difficulty in walking, not elsewhere classified: Secondary | ICD-10-CM | POA: Diagnosis not present

## 2017-10-03 DIAGNOSIS — R262 Difficulty in walking, not elsewhere classified: Secondary | ICD-10-CM | POA: Diagnosis not present

## 2017-10-03 DIAGNOSIS — N390 Urinary tract infection, site not specified: Secondary | ICD-10-CM | POA: Diagnosis not present

## 2017-10-04 DIAGNOSIS — N390 Urinary tract infection, site not specified: Secondary | ICD-10-CM | POA: Diagnosis not present

## 2017-10-04 DIAGNOSIS — R262 Difficulty in walking, not elsewhere classified: Secondary | ICD-10-CM | POA: Diagnosis not present

## 2017-10-05 DIAGNOSIS — N390 Urinary tract infection, site not specified: Secondary | ICD-10-CM | POA: Diagnosis not present

## 2017-10-05 DIAGNOSIS — R262 Difficulty in walking, not elsewhere classified: Secondary | ICD-10-CM | POA: Diagnosis not present

## 2017-10-08 DIAGNOSIS — R262 Difficulty in walking, not elsewhere classified: Secondary | ICD-10-CM | POA: Diagnosis not present

## 2017-10-08 DIAGNOSIS — N390 Urinary tract infection, site not specified: Secondary | ICD-10-CM | POA: Diagnosis not present

## 2017-10-09 DIAGNOSIS — N390 Urinary tract infection, site not specified: Secondary | ICD-10-CM | POA: Diagnosis not present

## 2017-10-09 DIAGNOSIS — R262 Difficulty in walking, not elsewhere classified: Secondary | ICD-10-CM | POA: Diagnosis not present

## 2017-10-10 DIAGNOSIS — R262 Difficulty in walking, not elsewhere classified: Secondary | ICD-10-CM | POA: Diagnosis not present

## 2017-10-10 DIAGNOSIS — N390 Urinary tract infection, site not specified: Secondary | ICD-10-CM | POA: Diagnosis not present

## 2017-10-26 DIAGNOSIS — M25552 Pain in left hip: Secondary | ICD-10-CM | POA: Diagnosis not present

## 2017-10-26 DIAGNOSIS — W19XXXA Unspecified fall, initial encounter: Secondary | ICD-10-CM | POA: Diagnosis not present

## 2017-10-31 ENCOUNTER — Non-Acute Institutional Stay (SKILLED_NURSING_FACILITY): Payer: Medicare Other | Admitting: Adult Health

## 2017-10-31 ENCOUNTER — Encounter: Payer: Self-pay | Admitting: Adult Health

## 2017-10-31 DIAGNOSIS — N390 Urinary tract infection, site not specified: Secondary | ICD-10-CM

## 2017-10-31 DIAGNOSIS — R339 Retention of urine, unspecified: Secondary | ICD-10-CM | POA: Diagnosis not present

## 2017-10-31 DIAGNOSIS — F0280 Dementia in other diseases classified elsewhere without behavioral disturbance: Secondary | ICD-10-CM

## 2017-10-31 DIAGNOSIS — G301 Alzheimer's disease with late onset: Secondary | ICD-10-CM

## 2017-10-31 DIAGNOSIS — G4700 Insomnia, unspecified: Secondary | ICD-10-CM | POA: Diagnosis not present

## 2017-10-31 NOTE — Progress Notes (Signed)
Location:  [facility] Room Number: 101-A Place of Service:  SNF (31) Provider:  Kenard GowerMedina-Vargas, Anara Cowman, NP  Patient Care Team: Pecola LawlessHopper, William F, MD as PCP - General (Internal Medicine)  Extended Emergency Contact Information Primary Emergency Contact: Ellene RoutePerdue,Katheleen  [place] Home Phone: [phone] Relation: Daughter Secondary Emergency Contact: Iantha FallenWiglesworth,Lynn  [place] Home Phone: [phone] Relation: Daughter  Code Status:  DNR  Goals of care: Advanced Directive information Advanced Directives 09/18/2017  Does Patient Have a Medical Advance Directive? Yes  Type of Advance Directive Out of facility DNR (pink MOST or yellow form)  Does patient want to make changes to medical advance directive? No - Patient declined  Copy of Healthcare Power of Attorney in Chart? -  Pre-existing out of facility DNR order (yellow form or pink MOST form) -     Chief Complaint  Patient presents with  . Medical Management of Chronic Issues    Routine [facility] visit    HPI:  Pt is a 83 y.o. female seen today for medical management of chronic diseases.  She is a [facility] care [facility].  She has a PMH of hearing loss, cataracts, urinary retention, mitral stenosis, osteoporosis, and dementia. She was seen in the room today. She was seen sitting on her recliner chair. She was recently treated for UTI.   Past Medical History:  Diagnosis Date  . Cataract    bilateral  . Hearing loss   . Memory change   . Migraine   . Mitral stenosis   . Osteoporosis   . Pneumonia   . Positive urine culture 04/24/2017   >100,000 Strep viridans; clinically colonization  . UTI (urinary tract infection)    Past Surgical History:  Procedure Laterality Date  . ABDOMINAL HYSTERECTOMY    . APPENDECTOMY    . BILATERAL SALPINGOOPHORECTOMY    . EYE SURGERY     bilateral  . TUBAL LIGATION      No Known  Allergies  Outpatient Encounter Medications as of 10/31/2017  Medication Sig  . Cranberry 450 MG TABS Take 1 tablet by mouth daily.  . cyanocobalamin (,VITAMIN B-12,) 1000 MCG/ML injection INJECT 1ML EVERY 30 DAYS  . ipratropium-albuterol (DUONEB) 0.5-2.5 (3) MG/3ML SOLN Take 3 mLs by nebulization every 6 (six) hours as needed (SOB).  Marland Kitchen. loperamide (IMODIUM A-D) 2 MG tablet Take 2 mg by mouth every 4 (four) hours as needed for diarrhea or loose stools.  . Melatonin 3 MG TABS Take 1 tablet by mouth at bedtime as needed.  . memantine (NAMENDA) 10 MG tablet Take 10 mg by mouth 2 (two) times daily.   Marland Kitchen. NUTRITIONAL SUPPLEMENT LIQD Take 120 mLs 2 (two) times daily by mouth. MedPass  . tamsulosin (FLOMAX) 0.4 MG CAPS capsule Take 0.4 mg by mouth every evening.  . vitamin C (ASCORBIC ACID) 500 MG tablet Take 500 mg by mouth daily.   No facility-administered encounter medications on file as of 10/31/2017.     Review of Systems  Unable to obtain due to dementia    Immunization History  Administered Date(s) Administered  . Influenza Split 08/06/2013, 07/25/2014, 08/26/2017  . Influenza Whole 09/02/2007, 09/02/2008, 08/12/2009, 07/24/2011  . Influenza, High Dose Seasonal PF 08/07/2015  . PPD Test 09/14/2015, 12/19/2016  . Pneumococcal Conjugate-13 09/16/2015  . Pneumococcal Polysaccharide-23 11/13/2001, 09/02/2007  . Td 11/14/1995, 09/02/2007  . Tdap 01/11/2017   Pertinent  Health Maintenance Due  Topic Date Due  . DEXA SCAN  11/14/2023 (Originally 08/16/1987)  . INFLUENZA VACCINE  Completed  . PNA vac Low Risk Adult  Completed   Fall Risk  06/19/2017 02/12/2017 08/10/2015 04/13/2014  Falls in the past year? Yes No Yes No  Number falls in past yr: 1 - 2 or more -  Injury with Fall? Yes - Yes -  Comment head pain - - -  Risk Factor Category  - - High Fall Risk -  Risk for fall due to : - - History of fall(s) -  Follow up - - Falls prevention discussed -      Vitals:   10/31/17 1029  BP:  115/60  Pulse: 68  Resp: 20  Temp: (!) 97.2 F (36.2 C)  TempSrc: Oral  SpO2: 94%  Weight: 102 lb (46.3 kg)  Height: 5\' 2"  (1.575 m)   Body mass index is 18.66 kg/m.  Physical Exam  GENERAL APPEARANCE:  In no acute distress.  SKIN:  Skin is warm and dry.  MOUTH and THROAT: Lips are without lesions. Oral mucosa is moist and without lesions.  RESPIRATORY: Breathing is even & unlabored, BS CTAB CARDIAC: RRR, no murmur,no extra heart sounds, no edema GI: Abdomen soft, normal BS, no masses, no tenderness EXTREMITIES:  Able to move X 4 extremities PSYCHIATRIC:Alert to self, disoriented to time and place . Affect and behavior are appropriate   Labs reviewed: Recent Labs    02/13/17 06/13/17  NA 141 143  K 4.0 4.4  BUN 23* 24*  CREATININE 0.4* 0.5   Recent Labs    02/13/17  AST 17  ALT 16  ALKPHOS 65   Recent Labs    02/13/17 06/13/17 06/18/17  WBC 4.6 6.4 5.6  NEUTROABS  --  4 3  HGB 11.9* 12.2 12.5  HCT 36 36 37  PLT 181 176 181   Lab Results  Component Value Date   TSH 1.44 02/18/2013    Assessment/Plan  1. Late onset Alzheimer's disease without behavioral disturbance - continue Memantine 10 mg 1 tab BID, supportive care, and fall precautions   2. Urinary retention - continue Tamsulosin 0.4 mg 1 capsule Q evening   3. Recurrent UTI - continue Cranberry 425 mg 1 tab daily, assist with perineal care daily   4. Insomnia, unspecified type - continue Melatonin 3 mg 1 tab Q HS PRN     Family/ staff Communication: Discussed plan of care with charge nurse.  Labs/tests ordered:  None  Goals of care:   Long-term care   Kenard GowerMonina Medina-Vargas, NP [facility] [phone] (Monday-Friday 8:00 a.m. - 5:00 p.m.) [phone] (after hours)

## 2017-11-21 ENCOUNTER — Non-Acute Institutional Stay (SKILLED_NURSING_FACILITY): Payer: Medicare Other | Admitting: Adult Health

## 2017-11-21 ENCOUNTER — Encounter: Payer: Self-pay | Admitting: Adult Health

## 2017-11-21 DIAGNOSIS — F0280 Dementia in other diseases classified elsewhere without behavioral disturbance: Secondary | ICD-10-CM

## 2017-11-21 DIAGNOSIS — R339 Retention of urine, unspecified: Secondary | ICD-10-CM | POA: Diagnosis not present

## 2017-11-21 DIAGNOSIS — G301 Alzheimer's disease with late onset: Secondary | ICD-10-CM

## 2017-11-21 DIAGNOSIS — G4700 Insomnia, unspecified: Secondary | ICD-10-CM | POA: Diagnosis not present

## 2017-11-21 NOTE — Progress Notes (Signed)
Location:  [facility] Room Number: 101-A Place of Service:  SNF (31) Provider:  Kenard GowerMedina-Vargas, Avya Flavell, NP  Patient Care Team: Pecola LawlessHopper, William F, MD as PCP - General (Internal Medicine)  Extended Emergency Contact Information Primary Emergency Contact: Ellene RoutePerdue,Katheleen  [place] Home Phone: [phone] Relation: Daughter Secondary Emergency Contact: Iantha FallenWiglesworth,Lynn  [place] Home Phone: [phone] Relation: Daughter  Code Status:  DNR   Goals of care: Advanced Directive information Advanced Directives 11/21/2017  Does Patient Have a Medical Advance Directive? Yes  Type of Advance Directive Out of facility DNR (pink MOST or yellow form)  Does patient want to make changes to medical advance directive? No - Patient declined  Copy of Healthcare Power of Attorney in Chart? -  Pre-existing out of facility DNR order (yellow form or pink MOST form) -     Chief Complaint  Patient presents with  . Medical Management of Chronic Issues    Routine [facility] visit    HPI:  Pt is a 84 y.o. female seen today for medical management of chronic diseases.  She is a [facility].  She has a PMH of hearing loss, cataracts, urinary retention, mitral stenosis, osteoporosis, and dementia. She was seen in her room walking with her walker. She denies any pain.   Past Medical History:  Diagnosis Date  . Cataract    bilateral  . Hearing loss   . Memory change   . Migraine   . Mitral stenosis   . Osteoporosis   . Pneumonia   . Positive urine culture 04/24/2017   >100,000 Strep viridans; clinically colonization  . UTI (urinary tract infection)    Past Surgical History:  Procedure Laterality Date  . ABDOMINAL HYSTERECTOMY    . APPENDECTOMY    . BILATERAL SALPINGOOPHORECTOMY    . EYE SURGERY     bilateral  . TUBAL LIGATION      No Known Allergies  Outpatient Encounter  Medications as of 11/21/2017  Medication Sig  . Cranberry 425 MG CAPS Take 1 capsule by mouth daily.  . cyanocobalamin (,VITAMIN B-12,) 1000 MCG/ML injection INJECT 1ML EVERY 30 DAYS  . ipratropium-albuterol (DUONEB) 0.5-2.5 (3) MG/3ML SOLN Take 3 mLs by nebulization every 6 (six) hours as needed (SOB).  Marland Kitchen. loperamide (IMODIUM A-D) 2 MG tablet Take 2 mg by mouth every 4 (four) hours as needed for diarrhea or loose stools.  . Melatonin 3 MG TABS Take 1 tablet by mouth at bedtime as needed.  . memantine (NAMENDA) 10 MG tablet Take 10 mg by mouth 2 (two) times daily.   Marland Kitchen. NUTRITIONAL SUPPLEMENT LIQD Take 120 mLs 2 (two) times daily by mouth. MedPass  . tamsulosin (FLOMAX) 0.4 MG CAPS capsule Take 0.4 mg by mouth every evening.  . vitamin C (ASCORBIC ACID) 500 MG tablet Take 500 mg by mouth daily.  . [DISCONTINUED] Cranberry 450 MG TABS Take 1 tablet by mouth daily.   No facility-administered encounter medications on file as of 11/21/2017.     Review of Systems  Unable to obtain due to dementia.     Immunization History  Administered Date(s) Administered  . Influenza Split 08/06/2013, 07/25/2014, 08/26/2017  . Influenza Whole 09/02/2007, 09/02/2008, 08/12/2009, 07/24/2011  . Influenza, High Dose Seasonal PF 08/07/2015  . PPD Test 09/14/2015, 12/19/2016  . Pneumococcal Conjugate-13 09/16/2015  . Pneumococcal Polysaccharide-23 11/13/2001, 09/02/2007  . Td 11/14/1995, 09/02/2007  . Tdap 01/11/2017   Pertinent  Health Maintenance Due  Topic Date Due  . DEXA SCAN  11/14/2023 (Originally 08/16/1987)  . INFLUENZA VACCINE  Completed  . PNA vac Low Risk Adult  Completed   Fall Risk  06/19/2017 02/12/2017 08/10/2015 04/13/2014  Falls in the past year? Yes No Yes No  Number falls in past yr: 1 - 2 or more -  Injury with Fall? Yes - Yes -  Comment head pain - - -  Risk Factor Category  - - High Fall Risk -  Risk for fall due to : - - History of fall(s) -  Follow up - - Falls prevention discussed -       Vitals:   11/21/17 0833  BP: 114/61  Pulse: 68  Resp: 20  Temp: 97.9 F (36.6 C)  TempSrc: Oral  Weight: 100 lb 3.2 oz (45.5 kg)  Height: 5\' 2"  (1.575 m)   Body mass index is 18.33 kg/m.  Physical Exam  GENERAL APPEARANCE:  In no acute distress.  SKIN:  Skin is warm and dry.  MOUTH and THROAT: Lips are without lesions. Oral mucosa is moist and without lesions.  RESPIRATORY: Breathing is even & unlabored, BS CTAB CARDIAC: RRR, no murmur,no extra heart sounds, no edema GI: Abdomen soft, normal BS, no masses, no tenderness, no hepatomegaly, no splenomegaly EXTREMITIES:  Able to move X 4 extremities, walks with walker PSYCHIATRIC: Alert to self, disoriented to time and place. Affect and behavior are appropriate    Labs reviewed: Recent Labs    02/13/17 06/13/17  NA 141 143  K 4.0 4.4  BUN 23* 24*  CREATININE 0.4* 0.5   Recent Labs    02/13/17  AST 17  ALT 16  ALKPHOS 65   Recent Labs    02/13/17 06/13/17 06/18/17  WBC 4.6 6.4 5.6  NEUTROABS  --  4 3  HGB 11.9* 12.2 12.5  HCT 36 36 37  PLT 181 176 181   Lab Results  Component Value Date   TSH 1.44 02/18/2013   Assessment/Plan  1. Insomnia, unspecified type - continue continue melatonin 3 mg 1 tab daily at bedtime when necessary   2. Urinary retention - continue tamsulosin 0.4 mg 1 capsule every evening   3. Late onset Alzheimer's disease without behavioral disturbance - continue memantine 10 mg 1 tablet twice a day, supportive care and fall precautions     Family/ staff Communication: Discussed plan of care with resident and charge nurse.  Labs/tests ordered:  CBC and CMP  Goals of care:   Long-term care   Kenard Gower, NP [facility] [phone] (Monday-Friday 8:00 a.m. - 5:00 p.m.) [phone] (after hours)

## 2017-11-22 DIAGNOSIS — D649 Anemia, unspecified: Secondary | ICD-10-CM | POA: Diagnosis not present

## 2017-11-22 DIAGNOSIS — I482 Chronic atrial fibrillation: Secondary | ICD-10-CM | POA: Diagnosis not present

## 2017-11-22 DIAGNOSIS — G629 Polyneuropathy, unspecified: Secondary | ICD-10-CM | POA: Diagnosis not present

## 2017-11-22 DIAGNOSIS — I10 Essential (primary) hypertension: Secondary | ICD-10-CM | POA: Diagnosis not present

## 2017-11-22 DIAGNOSIS — M6281 Muscle weakness (generalized): Secondary | ICD-10-CM | POA: Diagnosis not present

## 2017-11-22 LAB — HEPATIC FUNCTION PANEL
ALT: 12 (ref 7–35)
AST: 12 — AB (ref 13–35)
Alkaline Phosphatase: 76 (ref 25–125)
BILIRUBIN, TOTAL: 0.3

## 2017-11-22 LAB — BASIC METABOLIC PANEL
BUN: 18 (ref 4–21)
Creatinine: 0.4 — AB (ref 0.5–1.1)
GLUCOSE: 87
Potassium: 4 (ref 3.4–5.3)
SODIUM: 144 (ref 137–147)

## 2017-11-22 LAB — CBC AND DIFFERENTIAL
HEMATOCRIT: 33 — AB (ref 36–46)
Hemoglobin: 11.4 — AB (ref 12.0–16.0)
NEUTROS ABS: 3
Platelets: 185 (ref 150–399)
WBC: 4.7

## 2017-12-25 DIAGNOSIS — R319 Hematuria, unspecified: Secondary | ICD-10-CM | POA: Diagnosis not present

## 2017-12-25 DIAGNOSIS — Z79899 Other long term (current) drug therapy: Secondary | ICD-10-CM | POA: Diagnosis not present

## 2017-12-25 DIAGNOSIS — N390 Urinary tract infection, site not specified: Secondary | ICD-10-CM | POA: Diagnosis not present

## 2018-01-02 ENCOUNTER — Encounter: Payer: Self-pay | Admitting: Adult Health

## 2018-01-02 ENCOUNTER — Non-Acute Institutional Stay (SKILLED_NURSING_FACILITY): Payer: Medicare Other | Admitting: Adult Health

## 2018-01-02 DIAGNOSIS — E538 Deficiency of other specified B group vitamins: Secondary | ICD-10-CM

## 2018-01-02 DIAGNOSIS — F0280 Dementia in other diseases classified elsewhere without behavioral disturbance: Secondary | ICD-10-CM | POA: Diagnosis not present

## 2018-01-02 DIAGNOSIS — N390 Urinary tract infection, site not specified: Secondary | ICD-10-CM | POA: Diagnosis not present

## 2018-01-02 DIAGNOSIS — G4700 Insomnia, unspecified: Secondary | ICD-10-CM | POA: Diagnosis not present

## 2018-01-02 DIAGNOSIS — G301 Alzheimer's disease with late onset: Secondary | ICD-10-CM | POA: Diagnosis not present

## 2018-01-02 DIAGNOSIS — R339 Retention of urine, unspecified: Secondary | ICD-10-CM

## 2018-01-02 NOTE — Progress Notes (Signed)
Location:  [facility] Room Number: 101-A Place of Service:  SNF (31) Provider:  Kenard GowerMedina-Vargas, Vasiliy Mccarry, NP  Patient Care Team: Pecola LawlessHopper, William F, MD as PCP - General (Internal Medicine)  Extended Emergency Contact Information Primary Emergency Contact: Ellene RoutePerdue,Katheleen  [place] Home Phone: [phone] Relation: Daughter Secondary Emergency Contact: Iantha FallenWiglesworth,Lynn  [place] Home Phone: [phone] Relation: Daughter  Code Status:  DNR  Goals of care: Advanced Directive information Advanced Directives 11/21/2017  Does Patient Have a Medical Advance Directive? Yes  Type of Advance Directive Out of facility DNR (pink MOST or yellow form)  Does patient want to make changes to medical advance directive? No - Patient declined  Copy of Healthcare Power of Attorney in Chart? -  Pre-existing out of facility DNR order (yellow form or pink MOST form) -     Chief Complaint  Patient presents with  . Medical Management of Chronic Issues    Routine [facility] visit    HPI:  Pt is a 84 y.o. female seen today for medical management of chronic diseases.  She is a [facility] care [facility] of [facility].  She has a PMH of urinary retention, hearing loss, cataracts, osteoporosis, mitral stenosis, and dementia. She was seen in her room today. She was sleeping on her recliner and woke up upon verbal greetings. She denies any discomfort. She is currently treated with Nitrofurantoin for UTI. No reported fever, hematuria, nor dysuria.   Past Medical History:  Diagnosis Date  . Cataract    bilateral  . Hearing loss   . Memory change   . Migraine   . Mitral stenosis   . Osteoporosis   . Pneumonia   . Positive urine culture 04/24/2017   >100,000 Strep viridans; clinically colonization  . UTI (urinary tract infection)    Past Surgical History:  Procedure Laterality Date  . ABDOMINAL HYSTERECTOMY    .  APPENDECTOMY    . BILATERAL SALPINGOOPHORECTOMY    . EYE SURGERY     bilateral  . TUBAL LIGATION      No Known Allergies  Outpatient Encounter Medications as of 01/02/2018  Medication Sig  . Cranberry 425 MG CAPS Take 1 capsule by mouth daily.  . cyanocobalamin (,VITAMIN B-12,) 1000 MCG/ML injection INJECT 1ML EVERY 30 DAYS  . ipratropium-albuterol (DUONEB) 0.5-2.5 (3) MG/3ML SOLN Take 3 mLs by nebulization every 6 (six) hours as needed (SOB).  Marland Kitchen. loperamide (IMODIUM A-D) 2 MG tablet Take 2 mg by mouth every 4 (four) hours as needed for diarrhea or loose stools.  . Melatonin 3 MG TABS Take 1 tablet by mouth at bedtime as needed.  . memantine (NAMENDA) 10 MG tablet Take 10 mg by mouth 2 (two) times daily.   . nitrofurantoin (MACRODANTIN) 100 MG capsule Take 100 mg by mouth 2 (two) times daily.  Marland Kitchen. NUTRITIONAL SUPPLEMENT LIQD Take 120 mLs 2 (two) times daily by mouth. MedPass  . saccharomyces boulardii (FLORASTOR) 250 MG capsule Take 250 mg by mouth 2 (two) times daily.  . tamsulosin (FLOMAX) 0.4 MG CAPS capsule Take 0.4 mg by mouth every evening.  . vitamin C (ASCORBIC ACID) 500 MG tablet Take 500 mg by mouth daily.   No facility-administered encounter medications on file as of 01/02/2018.     Review of Systems  Unable to obtain due to dementia    Immunization History  Administered Date(s) Administered  . Influenza Split 08/06/2013, 07/25/2014, 08/26/2017  . Influenza Whole 09/02/2007, 09/02/2008, 08/12/2009, 07/24/2011  .  Influenza, High Dose Seasonal PF 08/07/2015  . PPD Test 09/14/2015, 12/19/2016  . Pneumococcal Conjugate-13 09/16/2015  . Pneumococcal Polysaccharide-23 11/13/2001, 09/02/2007  . Td 11/14/1995, 09/02/2007  . Tdap 01/11/2017   Pertinent  Health Maintenance Due  Topic Date Due  . INFLUENZA VACCINE  Completed  . PNA vac Low Risk Adult  Completed  . DEXA SCAN  Discontinued   Fall Risk  06/19/2017 02/12/2017 08/10/2015 04/13/2014  Falls in the past year? Yes No Yes  No  Number falls in past yr: 1 - 2 or more -  Injury with Fall? Yes - Yes -  Comment head pain - - -  Risk Factor Category  - - High Fall Risk -  Risk for fall due to : - - History of fall(s) -  Follow up - - Falls prevention discussed -      Vitals:   01/02/18 0949  BP: (!) 110/59  Pulse: 83  Resp: 13  Temp: 97.7 F (36.5 C)  TempSrc: Oral  SpO2: 94%  Weight: 101 lb 9.6 oz (46.1 kg)  Height: 5\' 2"  (1.575 m)   Body mass index is 18.58 kg/m.  Physical Exam  GENERAL APPEARANCE:  In no acute distress.  SKIN:  Skin is warm and dry.  MOUTH and THROAT: Lips are without lesions. Oral mucosa is moist and without lesions.  RESPIRATORY: Breathing is even & unlabored, BS CTAB CARDIAC: RRR, no murmur,no extra heart sounds, no edema GI: Abdomen soft, normal BS, no masses, no tenderness EXTREMITIES:  Able to move X 4 extremities PSYCHIATRIC: Alert to self, disoriented to time and place. Affect and behavior are appropriate   Labs reviewed: Recent Labs    02/13/17 06/13/17 11/22/17  NA 141 143 144  K 4.0 4.4 4.0  BUN 23* 24* 18  CREATININE 0.4* 0.5 0.4*   Recent Labs    02/13/17 11/22/17  AST 17 12*  ALT 16 12  ALKPHOS 65 76   Recent Labs    06/13/17 06/18/17 11/22/17  WBC 6.4 5.6 4.7  NEUTROABS 4 3 3   HGB 12.2 12.5 11.4*  HCT 36 37 33*  PLT 176 181 185   Lab Results  Component Value Date   TSH 1.44 02/18/2013    Assessment/Plan  1. Urinary tract infection without hematuria, site unspecified  - currently on nitrofurantoin 100 mg every 12 hours for a total of 7 days and Florastor 250 mg 1 capsule twice a day for a total of 10 days   2. Urinary retention - continue tamsulosin 0.4 mg 1 capsule every evening   3. Insomnia, unspecified type - continue melatonin 3 mg 1 tab daily at bedtime when necessary   4. Vitamin B12 deficiency - continue cyanocobalamin 1000 g/ML inject 1 mL IM monthly   5. Late onset Alzheimer's disease without behavioral disturbance -  continue memantine 10 mg 1 tab twice a day, supportive care and fall precautions    Family/ staff Communication: Discussed plan of care with charge nurse.  Labs/tests ordered:  None  Goals of care:   Long-term care  Kenard Gower, NP [facility] [phone] (Monday-Friday 8:00 a.m. - 5:00 p.m.) [phone] (after hours)

## 2018-01-09 ENCOUNTER — Encounter: Payer: Self-pay | Admitting: Adult Health

## 2018-01-09 ENCOUNTER — Non-Acute Institutional Stay (SKILLED_NURSING_FACILITY): Payer: Medicare Other | Admitting: Adult Health

## 2018-01-09 DIAGNOSIS — I10 Essential (primary) hypertension: Secondary | ICD-10-CM | POA: Diagnosis not present

## 2018-01-09 DIAGNOSIS — R638 Other symptoms and signs concerning food and fluid intake: Secondary | ICD-10-CM | POA: Diagnosis not present

## 2018-01-09 DIAGNOSIS — G309 Alzheimer's disease, unspecified: Secondary | ICD-10-CM | POA: Diagnosis not present

## 2018-01-09 DIAGNOSIS — I4891 Unspecified atrial fibrillation: Secondary | ICD-10-CM

## 2018-01-09 DIAGNOSIS — I342 Nonrheumatic mitral (valve) stenosis: Secondary | ICD-10-CM | POA: Diagnosis not present

## 2018-01-09 DIAGNOSIS — G9340 Encephalopathy, unspecified: Secondary | ICD-10-CM | POA: Diagnosis not present

## 2018-01-09 DIAGNOSIS — F0280 Dementia in other diseases classified elsewhere without behavioral disturbance: Secondary | ICD-10-CM

## 2018-01-09 DIAGNOSIS — E46 Unspecified protein-calorie malnutrition: Secondary | ICD-10-CM | POA: Diagnosis not present

## 2018-01-09 DIAGNOSIS — G301 Alzheimer's disease with late onset: Secondary | ICD-10-CM

## 2018-01-09 DIAGNOSIS — N390 Urinary tract infection, site not specified: Secondary | ICD-10-CM | POA: Diagnosis not present

## 2018-01-09 DIAGNOSIS — D649 Anemia, unspecified: Secondary | ICD-10-CM | POA: Diagnosis not present

## 2018-01-09 LAB — BASIC METABOLIC PANEL
BUN: 114 — AB (ref 4–21)
Creatinine: 1 (ref 0.5–1.1)
Glucose: 158
Potassium: 4.3 (ref 3.4–5.3)
Sodium: 150 — AB (ref 137–147)

## 2018-01-09 LAB — CBC AND DIFFERENTIAL
HCT: 43 (ref 36–46)
HEMOGLOBIN: 14.9 (ref 12.0–16.0)
Neutrophils Absolute: 9
PLATELETS: 269 (ref 150–399)
WBC: 10.1

## 2018-01-09 NOTE — Progress Notes (Signed)
Location:  [facility] Room Number: 101-A Place of Service:  SNF (31) Provider:  Kenard GowerMedina-Vargas, Ezana Hubbert, NP  Patient Care Team: Pecola LawlessHopper, William F, MD as PCP - General (Internal Medicine)  Extended Emergency Contact Information Primary Emergency Contact: Ellene RoutePerdue,Katheleen  [place] Home Phone: [phone] Relation: Daughter Secondary Emergency Contact: Iantha FallenWiglesworth,Lynn  [place] Home Phone: [phone] Relation: Daughter  Code Status:  DNR  Goals of care: Advanced Directive information Advanced Directives 01/02/2018  Does Patient Have a Medical Advance Directive? Yes  Type of Advance Directive Out of facility DNR (pink MOST or yellow form)  Does patient want to make changes to medical advance directive? No - Patient declined  Copy of Healthcare Power of Attorney in Chart? -  Pre-existing out of facility DNR order (yellow form or pink MOST form) -     Chief Complaint  Patient presents with  . Acute Visit    Change in condition    HPI:  Pt is a 84 y.o. female seen today for medical management of chronic diseases. She is a long-[facility] care resident of [facility].  She has a PMH of urinary retention, hearing loss, cataracts, osteoporosis, mitral stenosis, and dementia. She was seen in the room today. She is asleep and opens her eyes to verbal greetings then would sleep again. She was reported to have not eaten breakfast. Her mouth is dry. D5NS IV 1L and stat BMP and CBC was ordered. Called daughter and and updated her with the resident's condition. Daughter verbalized that she preferred patient to have hospice consult. She said that she'll talk to her brother and update him with resident's condition.   Past Medical History:  Diagnosis Date  . Cataract    bilateral  . Hearing loss   . Memory change   . Migraine   . Mitral stenosis   . Osteoporosis   . Pneumonia   . Positive urine culture 04/24/2017     >100,000 Strep viridans; clinically colonization  . UTI (urinary tract infection)    Past Surgical History:  Procedure Laterality Date  . ABDOMINAL HYSTERECTOMY    . APPENDECTOMY    . BILATERAL SALPINGOOPHORECTOMY    . EYE SURGERY     bilateral  . TUBAL LIGATION      No Known Allergies  Outpatient Encounter Medications as of 01/09/2018  Medication Sig  . Cranberry 425 MG CAPS Take 1 capsule by mouth daily.  . cyanocobalamin (,VITAMIN B-12,) 1000 MCG/ML injection INJECT 1ML EVERY 30 DAYS  . ipratropium-albuterol (DUONEB) 0.5-2.5 (3) MG/3ML SOLN Take 3 mLs by nebulization every 6 (six) hours as needed (SOB).  . Melatonin 3 MG TABS Take 1 tablet by mouth at bedtime as needed.  . memantine (NAMENDA) 10 MG tablet Take 10 mg by mouth 2 (two) times daily.   Marland Kitchen. NUTRITIONAL SUPPLEMENT LIQD Take 120 mLs 2 (two) times daily by mouth. MedPass  . tamsulosin (FLOMAX) 0.4 MG CAPS capsule Take 0.4 mg by mouth every evening.  . vitamin C (ASCORBIC ACID) 500 MG tablet Take 500 mg by mouth daily.  . [DISCONTINUED] loperamide (IMODIUM A-D) 2 MG tablet Take 2 mg by mouth every 4 (four) hours as needed for diarrhea or loose stools.   No facility-administered encounter medications on file as of 01/09/2018.     Review of Systems  Unable to obtain due to condition    Immunization History  Administered Date(s) Administered  . Influenza Split 08/06/2013, 07/25/2014, 08/26/2017  . Influenza Whole  09/02/2007, 09/02/2008, 08/12/2009, 07/24/2011  . Influenza, High Dose Seasonal PF 08/07/2015  . PPD Test 09/14/2015, 12/19/2016  . Pneumococcal Conjugate-13 09/16/2015  . Pneumococcal Polysaccharide-23 11/13/2001, 09/02/2007  . Td 11/14/1995, 09/02/2007  . Tdap 01/11/2017   Pertinent  Health Maintenance Due  Topic Date Due  . INFLUENZA VACCINE  Completed  . PNA vac Low Risk Adult  Completed  . DEXA SCAN  Discontinued   Fall Risk  06/19/2017 02/12/2017 08/10/2015 04/13/2014  Falls in the past year? Yes No  Yes No  Number falls in past yr: 1 - 2 or more -  Injury with Fall? Yes - Yes -  Comment head pain - - -  Risk Factor Category  - - High Fall Risk -  Risk for fall due to : - - History of fall(s) -  Follow up - - Falls prevention discussed -      Vitals:   01/09/18 0937  BP: (!) 110/59  Pulse: 83  Resp: 13  Temp: 97.7 F (36.5 C)  TempSrc: Oral  SpO2: 94%  Weight: 101 lb 9.6 oz (46.1 kg)  Height: 5\' 2"  (1.575 m)   Body mass index is 18.58 kg/m.  Physical Exam  GENERAL APPEARANCE: . In no acute distress.  SKIN:  Skin is warm and dry.  MOUTH and THROAT: Lips and mouth are dry RESPIRATORY: Breathing is even & unlabored, BS CTAB CARDIAC: RRR, + murmur,no extra heart sounds, no edema GI: Abdomen soft, normal BS, no masses, no tenderness EXTREMITIES:  No edema nor cyanosis   Labs reviewed: Recent Labs    02/13/17 06/13/17 11/22/17  NA 141 143 144  K 4.0 4.4 4.0  BUN 23* 24* 18  CREATININE 0.4* 0.5 0.4*   Recent Labs    02/13/17 11/22/17  AST 17 12*  ALT 16 12  ALKPHOS 65 76   Recent Labs    06/13/17 06/18/17 11/22/17  WBC 6.4 5.6 4.7  NEUTROABS 4 3 3   HGB 12.2 12.5 11.4*  HCT 36 37 33*  PLT 176 181 185   Lab Results  Component Value Date   TSH 1.44 02/18/2013    Assessment/Plan  1. Acute encephalopathy - she has poor oral intake and this is thought to be due to dehydration, will check CBC and BMP   2. Poor fluid intake - will start D5 NS @ 70 ml X 1L for hydration   3. Atrial fibrillation with RVR (HCC) - rate-controlled   4. Late onset Alzheimer's disease without behavioral disturbance - she has been refusing to eat according to staff, daughter requested hospice/comfort care, referred to [facility]       Family/ staff Communication: Discussed plan of care with daughter and charge nurse.  Labs/tests ordered:  BMP and CBC  Goals of care:   Long-term care, hospice care   Kenard Gower, NP [facility]  [facility] [phone] (Monday-Friday 8:00 a.m. - 5:00 p.m.) [phone] (after hours)

## 2018-01-10 DIAGNOSIS — I342 Nonrheumatic mitral (valve) stenosis: Secondary | ICD-10-CM | POA: Diagnosis not present

## 2018-01-10 DIAGNOSIS — E46 Unspecified protein-calorie malnutrition: Secondary | ICD-10-CM | POA: Diagnosis not present

## 2018-01-10 DIAGNOSIS — N390 Urinary tract infection, site not specified: Secondary | ICD-10-CM | POA: Diagnosis not present

## 2018-01-10 DIAGNOSIS — G309 Alzheimer's disease, unspecified: Secondary | ICD-10-CM | POA: Diagnosis not present

## 2018-02-11 DEATH — deceased
